# Patient Record
Sex: Female | Born: 1935 | Race: White | Hispanic: No | Smoking: Former smoker
Health system: Southern US, Community
[De-identification: ages and names within clinical notes are randomized; demographics above are authoritative.]

## PROBLEM LIST (undated history)

## (undated) DIAGNOSIS — M1990 Unspecified osteoarthritis, unspecified site: Secondary | ICD-10-CM

## (undated) DIAGNOSIS — I480 Paroxysmal atrial fibrillation: Secondary | ICD-10-CM

## (undated) DIAGNOSIS — I10 Essential (primary) hypertension: Secondary | ICD-10-CM

## (undated) DIAGNOSIS — J45909 Unspecified asthma, uncomplicated: Secondary | ICD-10-CM

## (undated) DIAGNOSIS — K219 Gastro-esophageal reflux disease without esophagitis: Secondary | ICD-10-CM

## (undated) DIAGNOSIS — J449 Chronic obstructive pulmonary disease, unspecified: Secondary | ICD-10-CM

## (undated) DIAGNOSIS — IMO0001 Reserved for inherently not codable concepts without codable children: Secondary | ICD-10-CM

## (undated) DIAGNOSIS — Z972 Presence of dental prosthetic device (complete) (partial): Secondary | ICD-10-CM

## (undated) DIAGNOSIS — I499 Cardiac arrhythmia, unspecified: Secondary | ICD-10-CM

## (undated) DIAGNOSIS — E7800 Pure hypercholesterolemia, unspecified: Secondary | ICD-10-CM

## (undated) DIAGNOSIS — K08109 Complete loss of teeth, unspecified cause, unspecified class: Secondary | ICD-10-CM

## (undated) DIAGNOSIS — M810 Age-related osteoporosis without current pathological fracture: Secondary | ICD-10-CM

## (undated) HISTORY — PX: ESOPHAGOGASTRODUODENOSCOPY (EGD) WITH ESOPHAGEAL DILATION: SHX5812

## (undated) HISTORY — PX: JOINT REPLACEMENT: SHX530

## (undated) HISTORY — PX: WRIST FRACTURE SURGERY: SHX121

## (undated) HISTORY — PX: BACK SURGERY: SHX140

---

## 2004-03-08 ENCOUNTER — Ambulatory Visit: Payer: Self-pay | Admitting: Unknown Physician Specialty

## 2004-03-15 ENCOUNTER — Ambulatory Visit: Payer: Self-pay | Admitting: Unknown Physician Specialty

## 2004-04-25 ENCOUNTER — Ambulatory Visit: Payer: Self-pay | Admitting: Surgery

## 2004-08-09 ENCOUNTER — Ambulatory Visit: Payer: Self-pay | Admitting: Internal Medicine

## 2004-10-16 ENCOUNTER — Inpatient Hospital Stay: Payer: Self-pay | Admitting: Internal Medicine

## 2005-02-14 ENCOUNTER — Other Ambulatory Visit: Payer: Self-pay

## 2005-02-14 ENCOUNTER — Inpatient Hospital Stay: Payer: Self-pay | Admitting: Internal Medicine

## 2005-04-02 ENCOUNTER — Inpatient Hospital Stay: Payer: Self-pay | Admitting: Unknown Physician Specialty

## 2005-04-02 ENCOUNTER — Other Ambulatory Visit: Payer: Self-pay

## 2005-04-11 ENCOUNTER — Encounter: Payer: Self-pay | Admitting: Internal Medicine

## 2006-07-03 ENCOUNTER — Ambulatory Visit: Payer: Self-pay | Admitting: Specialist

## 2006-07-18 ENCOUNTER — Emergency Department: Payer: Self-pay | Admitting: Emergency Medicine

## 2006-10-06 ENCOUNTER — Ambulatory Visit: Payer: Self-pay | Admitting: Ophthalmology

## 2006-10-06 ENCOUNTER — Other Ambulatory Visit: Payer: Self-pay

## 2006-10-13 ENCOUNTER — Ambulatory Visit: Payer: Self-pay | Admitting: Ophthalmology

## 2008-05-12 ENCOUNTER — Ambulatory Visit: Payer: Self-pay | Admitting: Internal Medicine

## 2008-05-23 ENCOUNTER — Ambulatory Visit: Payer: Self-pay | Admitting: Unknown Physician Specialty

## 2010-11-14 ENCOUNTER — Ambulatory Visit: Payer: Self-pay | Admitting: Internal Medicine

## 2011-06-13 ENCOUNTER — Ambulatory Visit: Payer: Self-pay

## 2011-06-18 ENCOUNTER — Ambulatory Visit: Payer: Self-pay | Admitting: Unknown Physician Specialty

## 2011-06-18 LAB — CBC
HCT: 39.1 % (ref 35.0–47.0)
MCH: 28.3 pg (ref 26.0–34.0)
MCHC: 32.7 g/dL (ref 32.0–36.0)
MCV: 86 fL (ref 80–100)
Platelet: 234 10*3/uL (ref 150–440)
RBC: 4.53 10*6/uL (ref 3.80–5.20)
WBC: 6.7 10*3/uL (ref 3.6–11.0)

## 2011-06-18 LAB — BASIC METABOLIC PANEL
Anion Gap: 11 (ref 7–16)
BUN: 11 mg/dL (ref 7–18)
Chloride: 105 mmol/L (ref 98–107)
Co2: 26 mmol/L (ref 21–32)
Creatinine: 0.81 mg/dL (ref 0.60–1.30)
EGFR (Non-African Amer.): >60
Osmolality: 282 (ref 275–301)
Potassium: 4.2 mmol/L (ref 3.5–5.1)
Sodium: 142 mmol/L (ref 136–145)

## 2011-06-18 LAB — URINALYSIS, COMPLETE
Bacteria: NONE SEEN
Glucose,UR: NEGATIVE mg/dL (ref 0–75)
Nitrite: NEGATIVE
Protein: NEGATIVE
RBC,UR: 16 /HPF (ref 0–5)
Squamous Epithelial: 7
Transitional Epi: <1
WBC UR: 5 /HPF (ref 0–5)

## 2011-06-20 LAB — PATHOLOGY REPORT

## 2014-06-07 ENCOUNTER — Observation Stay: Payer: Self-pay | Admitting: Internal Medicine

## 2014-06-08 ENCOUNTER — Encounter
Admit: 2014-06-08 | Disposition: A | Discharge status: Home / Self Care | Payer: Self-pay | Attending: Internal Medicine | Admitting: Internal Medicine

## 2014-06-30 LAB — URINALYSIS, COMPLETE
Bilirubin,UR: NEGATIVE
Glucose,UR: NEGATIVE mg/dL (ref 0–75)
KETONE: NEGATIVE
NITRITE: NEGATIVE
PROTEIN: NEGATIVE
Ph: 6 (ref 4.5–8.0)
RBC,UR: 1 /HPF (ref 0–5)
SPECIFIC GRAVITY: 1.01 (ref 1.003–1.030)
Squamous Epithelial: 3
WBC UR: 14 /HPF (ref 0–5)

## 2014-07-08 ENCOUNTER — Encounter
Admit: 2014-07-08 | Disposition: A | Discharge status: Home / Self Care | Payer: Self-pay | Attending: Internal Medicine | Admitting: Internal Medicine

## 2014-07-31 NOTE — Op Note (Signed)
PATIENT NAME:  Crystal Todd, Crystal Todd MR#:  [id number] DATE OF BIRTH:  [birth year]  DATE OF PROCEDURE:  06/18/2011  PREOPERATIVE DIAGNOSIS: Compression fracture of L1 with persistent pain.   POSTOPERATIVE DIAGNOSIS: Compression fracture of L1 with persistent pain.   PROCEDURES:  1. Kyphoplasty with biopsy of L1 and injection of PMMA cement.  2. Fluoroscopic interpretation of trocar placement for kyphoplasty and biopsy of L1.   SURGEON: Hamsini Verrilli C. Ilai Hiller, MD   ASSISTANT: None.   ANESTHESIA: MAC.   ESTIMATED BLOOD LOSS: Negligible.   COMPLICATIONS: None.   SPECIMEN SENT: Tissue from L1.   BRIEF CLINICAL NOTE AND PATHOLOGY: The patient had severe unrelenting back pain with documented compression fracture of L1. Old compression fractures at other levels. Options, risks, and benefits were discussed in detail. At the time of the procedure, there was very significant thoracic vertebral body. The vertebral body was expanded somewhat with the balloon.   A good bone specimen was obtained.   PROCEDURE: Preop antibiotics, adequate MAC anesthesia, prone position, all prominences well padded. Routine prepping and draping. Appropriate time-out was called.   The L1 vertebral body was identified and pedicles were aligned appropriately with the FluoroScan. Prepping and draping was performed and the vertebral body was instrumented in a routine fashion through the pedicle on the right side using fluoroscopic guidance. The trocar advanced nicely into the vertebral body and a good specimen was obtained. The balloon was then inserted, the guidewire was removed and the vertebral body was injected with 1 mL of 1% plain Xylocaine. The balloon was then inflated while using fluoroscopic guidance. The balloon was then removed and cement was injected when it was of the appropriate consistency. There was no evidence of extravasation. The cannula was cleaned and removed. Incision was closed with Monocryl. Soft sterile dressing  was applied. The patient was awakened and taken to the PAC-U having tolerated the procedure well.   ____________________________ Winn Jock. Gerrit Heck, MD jcc:drc D: 06/18/2011 14:09:41 ET T: 06/18/2011 15:03:30 ET JOB#: [id number]  cc: Winn Jock. Gerrit Heck, MD, <Dictator> Winn Jock Floretta Petro MD ELECTRONICALLY SIGNED 06/20/2011 12:23

## 2014-08-07 NOTE — Consult Note (Signed)
Brief Consult Note: Diagnosis: non displaced left distal radius fracture.   Patient was seen by consultant.   Comments: will cast later today.  Electronic Signatures: Leitha Schuller (MD)  (Signed 02-Mar-16 08:37)  Authored: Brief Consult Note   Last Updated: 02-Mar-16 08:37 by Leitha Schuller (MD)

## 2014-08-07 NOTE — H&P (Signed)
PATIENT NAME:  Crystal Todd, Crystal Todd MR#:  [id number] DATE OF BIRTH:  03/02/1928  DATE OF ADMISSION:  06/07/2014  PRIMARY CARE PROVIDER: Dr. Hyacinth Meeker   EMERGENCY DEPARTMENT REFERRING PHYSICIAN: Dr. Cyril Loosen   CHIEF COMPLAINT: Status post fall with multiple facial fractures and wrist fracture.   HISTORY OF PRESENT ILLNESS: The patient is a pleasant 81 year old white female with a history of having atrial fibrillation, essential hypertension, rheumatoid arthritis, reflux, depression, macular degeneration, stenosis of the right carotid artery who was walking in her driveway when she fell. She reports that she lost balance. She did not lose consciousness. She reports that she was fully aware when she was falling. She denies any chest pain, shortness of breath or palpitations. The patient came to the ER with these symptoms and had an evaluation which showed multiple facial bone fractures involving the anterior and lateral wall of the right maxillary sinus, inferior wall, right orbit and the medial wall of both maxillary sinuses. The ED physician spoke to the ENT doctor on call who recommended that she receive antibiotics for prevention of infection. The patient had a risk factor for which she had a splint placed. She otherwise denies any fevers, chills. No chest pain, palpitations.   PAST MEDICAL HISTORY: 1.  History of atrial fibrillation.  2.  History of essential hypertension.  3.  Rheumatoid arthritis.  4.  History of calculus of the gallbladder without mention of cholecystitis or obstruction.  5.  Unspecified hyperlipidemia.  6.  GERD.   7.  Senile osteoporosis.  8.  Chronic obstructive asthma.  9.  Anemia.  10.   Macular degeneration.  11.   Stenosis of the right carotid artery.  12.   Glaucoma.  13.   Cataract, senile.  14.   History of B12 deficiency.   ALLERGIES: ACE INHIBITORS, FLU VACCINE, FLOMAX, AND EGGS.   CURRENT MEDICATIONS: At home she is on vitamin D3 at 1000 international units  daily, vitamin B12 at 1000 mcg daily, tramadol 50 q. 6 p.r.n., Paxil 20 daily, omeprazole 20 daily, multivitamin 1 tab p.o. daily, metoprolol succinate ER 25 p.o. daily, Combivent inhalation every 6 hours as needed, Celexa 20 daily, aspirin 81 mg 1 tab p.o. daily, amlodipine 5 daily, Allegra 180 daily.   SOCIAL HISTORY: Used to smoke. No alcohol or drug use. Lives by herself.   FAMILY HISTORY: Positive for hypertension.   REVIEW OF SYSTEMS: CONSTITUTIONAL: Denies any fevers, chills. No weight loss or weight gain.  EYES: No blurred or double vision. History of glaucoma, senile cataract.  ENT: No tinnitus, no ear pain, hearing loss. No seasonal or year-round allergies. No difficulty swallowing.  RESPIRATORY: Denies any cough, wheezing, hemoptysis.  CARDIOVASCULAR: Denies any chest pain, orthopnea, edema, or arrhythmia.  GASTROINTESTINAL: No nausea, vomiting, diarrhea. No abdominal pain.  GENITOURINARY: Denies any dysuria, hematuria, renal colic or frequency.  ENDOCRINE: Denies any polyuria or nocturia.  HEMATOLOGIC AND LYMPHATIC: Denies anemia, easy bruisability or bleeding.  SKIN: Has bruising around her face.  MUSCULOSKELETAL: Has osteoarthritis. NEUROLOGIC: No CVA, TIA or seizures.   PHYSICAL EXAMINATION: VITAL SIGNS: Temperature 97.8, pulse 85, respirations 18, blood pressure 135/90, O2 95%.  GENERAL: The patient is a thin Caucasian female in no acute distress.  HEENT: Head atraumatic, normocephalic. Pupils equally round, reactive to light and accommodation. There is no conjunctival pallor. No sclerae icterus. Right eye with swelling around that, so unable to open the eye as a result of bruising. Left eye is reactive. Nasal area has blood. Oropharynx has  blood from the mouth.  NECK: Supple without any thyromegaly.  CARDIOVASCULAR: Regular rate and rhythm. No murmurs, rubs, clicks, or gallops.  LUNGS: Clear to auscultation bilaterally without any rales, rhonchi, wheezing.  ABDOMEN: Soft,  nontender, nondistended. Positive bowel sounds x 4.  EXTREMITIES: No clubbing, cyanosis, or edema.  SKIN: Has bruising around her face and her arms.  PSYCHIATRIC: Not anxious or depressed.  NEUROLOGIC: Awake, alert, oriented to place, person, time. No focal deficits.   EVALUATIONS: Glucose 124, BUN 8, creatinine 0.76, sodium 141, potassium 3.4, chloride 106, CO2 is 24, calcium 8.3. LFTs were normal, except albumin of 3.3. WBC 14.0, hemoglobin 12.7, platelet count 184,000.   ASSESSMENT AND PLAN: The patient is a 81 year old white female status post fall due to balance issues, has facial fracture and wrist fracture. The ED MD spoke to ENT and ophthalmology, recommended antibiotics and conservative management.  1.  Fall with multiple fractures. Supportive care, empiric antibiotics per ENT, risk factor, orthopedic consultation, splint.  2.  Hypertension. We will continue metoprolol and amlodipine.  3.  Depression. Continue citalopram.  4.  Miscellaneous. The patient will be on SCDs for deep vein thrombosis prophylaxis.   TIME SPENT ON THIS: 50 minutes.    ____________________________ Lacie Scotts Allena Katz, MD shp:at D: 06/07/2014 16:40:16 ET T: 06/07/2014 16:57:47 ET JOB#: [id number]  cc: Naftuli Dalsanto H. Allena Katz, MD, <Dictator> Charise Carwin MD ELECTRONICALLY SIGNED 06/10/2014 15:23

## 2014-08-07 NOTE — Consult Note (Signed)
PATIENT NAME:  Crystal Todd, Crystal Todd MR#:  [id number] DATE OF BIRTH:  14-Aug-1927  DATE OF CONSULTATION:  06/08/2014  CONSULTING PHYSICIAN:  Leitha Schuller, MD  EASON FOR CONSULTATION: Left wrist fracture.   HISTORY OF PRESENT ILLNESS: The patient is an 81 year old who fell while getting her mail yesterday. She suffered facial fractures and also suffered a large hematoma to the dorsum of the right hand with no apparent fractures. Left distal radius has an oblique nondisplaced fracture on x-ray and has consulted for evaluation.   PHYSICAL EXAMINATION: She is neurovascularly intact with minimal swelling. She is tender to palpation. There is some underlying osteoarthritis present on radiographs. She is able to flex and extend her fingers. All tendon and nerve function appears intact.   CLINICAL IMPRESSION: Nondisplaced distal radius fracture.   PLAN: Four weeks in a short-arm cast. Short-arm cast was applied today.  In 4 weeks will have her back, remove cast, x-ray and determine if it is healed enough to go to a removable brace or needs continue hard cast. Regarding her hand hematoma, I assured her this will slowly resolve without any further treatment.    ____________________________ Leitha Schuller, MD mjm:mc D: 06/08/2014 12:18:27 ET T: 06/08/2014 13:07:25 ET JOB#: [id number]  cc: Leitha Schuller, MD, <Dictator> Leitha Schuller MD ELECTRONICALLY SIGNED 06/08/2014 14:23

## 2014-08-07 NOTE — Discharge Summary (Signed)
PATIENT NAME:  Crystal Todd, Crystal Todd MR#:  [id number] DATE OF BIRTH:  November 18, 1927  DATE OF ADMISSION:  06/07/2014 DATE OF DISCHARGE:  06/09/2014  DISCHARGE DIAGNOSES: 1.  Right orbital fracture with maxillary fractures.  2.  Distal left wrist fracture.  3.  Rheumatoid arthritis.  4.  Hypertension.  5.  Chronic asthma. 6.  B12 deficiency.   DISCHARGE MEDICATIONS: Vitamin D3 1,000 units daily, amlodipine 5 mg daily, citalopram 20 mg daily, vitamin B12 1000 mcg daily, omeprazole 20 mg daily, metoprolol tartrate 12.5 mg b.i.d., Tylenol 650 mg q. 4 p.r.n. pain, Augmentin 875 mg b.i.d. x7 days.   REASON FOR ADMISSION: An 81 year old female who presents post fall with multiple fractures. Please see H and P for HPI, past medical history and physical exam.   HOSPITAL COURSE: The patient was admitted with fractures of her right maxilla, orbit and distal radius. Ophthalmology said there is no further treatment, and she does have good vision out of that right eye. ENT cleared her as well. Her swallowing improved significantly. Her blood counts and blood pressure were stable. Her appetite is good, but she is very weak and will be needing skilled nursing. Dr. Rosita Kea casted her left arm and she will need follow-up in [facility] orthopedics in 2 weeks. Overall prognosis is good.   ____________________________ Danella Penton, MD mfm:sb D: 06/09/2014 07:46:16 ET T: 06/09/2014 08:13:10 ET JOB#: [id number]  cc: Danella Penton, MD, <Dictator> Danella Penton MD ELECTRONICALLY SIGNED 06/10/2014 8:14

## 2014-10-07 ENCOUNTER — Encounter: Payer: Self-pay | Admitting: *Deleted

## 2014-10-11 NOTE — Discharge Instructions (Signed)

## 2014-10-12 ENCOUNTER — Ambulatory Visit: Payer: Medicare Other | Admitting: Anesthesiology

## 2014-10-12 ENCOUNTER — Ambulatory Visit
Admission: RE | Admit: 2014-10-12 | Discharge: 2014-10-12 | Disposition: A | Discharge status: Home / Self Care | Payer: Medicare Other | Source: Ambulatory Visit | Attending: Ophthalmology | Admitting: Ophthalmology

## 2014-10-12 ENCOUNTER — Encounter
Admission: RE | Disposition: A | Discharge status: Home / Self Care | Payer: Self-pay | Source: Ambulatory Visit | Attending: Ophthalmology

## 2014-10-12 DIAGNOSIS — Z887 Allergy status to serum and vaccine status: Secondary | ICD-10-CM | POA: Insufficient documentation

## 2014-10-12 DIAGNOSIS — H2512 Age-related nuclear cataract, left eye: Secondary | ICD-10-CM | POA: Diagnosis not present

## 2014-10-12 DIAGNOSIS — M810 Age-related osteoporosis without current pathological fracture: Secondary | ICD-10-CM | POA: Diagnosis not present

## 2014-10-12 DIAGNOSIS — Z9849 Cataract extraction status, unspecified eye: Secondary | ICD-10-CM | POA: Insufficient documentation

## 2014-10-12 DIAGNOSIS — Z79899 Other long term (current) drug therapy: Secondary | ICD-10-CM | POA: Diagnosis not present

## 2014-10-12 DIAGNOSIS — Z96649 Presence of unspecified artificial hip joint: Secondary | ICD-10-CM | POA: Diagnosis not present

## 2014-10-12 DIAGNOSIS — J45909 Unspecified asthma, uncomplicated: Secondary | ICD-10-CM | POA: Diagnosis not present

## 2014-10-12 DIAGNOSIS — E780 Pure hypercholesterolemia: Secondary | ICD-10-CM | POA: Diagnosis not present

## 2014-10-12 DIAGNOSIS — M069 Rheumatoid arthritis, unspecified: Secondary | ICD-10-CM | POA: Insufficient documentation

## 2014-10-12 DIAGNOSIS — Z7951 Long term (current) use of inhaled steroids: Secondary | ICD-10-CM | POA: Diagnosis not present

## 2014-10-12 DIAGNOSIS — I4891 Unspecified atrial fibrillation: Secondary | ICD-10-CM | POA: Insufficient documentation

## 2014-10-12 DIAGNOSIS — I10 Essential (primary) hypertension: Secondary | ICD-10-CM | POA: Diagnosis not present

## 2014-10-12 DIAGNOSIS — J449 Chronic obstructive pulmonary disease, unspecified: Secondary | ICD-10-CM | POA: Diagnosis not present

## 2014-10-12 DIAGNOSIS — Z87891 Personal history of nicotine dependence: Secondary | ICD-10-CM | POA: Insufficient documentation

## 2014-10-12 DIAGNOSIS — H269 Unspecified cataract: Secondary | ICD-10-CM | POA: Diagnosis present

## 2014-10-12 HISTORY — DX: Unspecified asthma, uncomplicated: J45.909

## 2014-10-12 HISTORY — DX: Chronic obstructive pulmonary disease, unspecified: J44.9

## 2014-10-12 HISTORY — DX: Cardiac arrhythmia, unspecified: I49.9

## 2014-10-12 HISTORY — DX: Gastro-esophageal reflux disease without esophagitis: K21.9

## 2014-10-12 HISTORY — DX: Unspecified osteoarthritis, unspecified site: M19.90

## 2014-10-12 HISTORY — PX: CATARACT EXTRACTION W/PHACO: SHX586

## 2014-10-12 HISTORY — DX: Reserved for inherently not codable concepts without codable children: IMO0001

## 2014-10-12 HISTORY — DX: Age-related osteoporosis without current pathological fracture: M81.0

## 2014-10-12 HISTORY — DX: Complete loss of teeth, unspecified cause, unspecified class: K08.109

## 2014-10-12 HISTORY — DX: Pure hypercholesterolemia, unspecified: E78.00

## 2014-10-12 HISTORY — DX: Essential (primary) hypertension: I10

## 2014-10-12 HISTORY — DX: Complete loss of teeth, unspecified cause, unspecified class: Z97.2

## 2014-10-12 SURGERY — PHACOEMULSIFICATION, CATARACT, WITH IOL INSERTION
Anesthesia: Topical | Laterality: Left | Wound class: Clean

## 2014-10-12 MED ORDER — NA HYALUR & NA CHOND-NA HYALUR 0.4-0.35 ML IO KIT
PACK | INTRAOCULAR | Status: DC | PRN
Start: 2014-10-12 — End: 2014-10-12
  Administered 2014-10-12: 1 mL via INTRAOCULAR

## 2014-10-12 MED ORDER — CEFUROXIME OPHTHALMIC INJECTION 1 MG/0.1 ML
INJECTION | OPHTHALMIC | Status: DC | PRN
  Administered 2014-10-12: 0.1 mL via INTRACAMERAL

## 2014-10-12 MED ORDER — FENTANYL CITRATE (PF) 100 MCG/2ML IJ SOLN
INTRAMUSCULAR | Status: DC | PRN
  Administered 2014-10-12: 50 ug via INTRAVENOUS

## 2014-10-12 MED ORDER — EPINEPHRINE HCL 1 MG/ML IJ SOLN
INTRAOCULAR | Status: DC | PRN
  Administered 2014-10-12: 65 mL via OPHTHALMIC

## 2014-10-12 MED ORDER — TETRACAINE HCL 0.5 % OP SOLN
1.0000 [drp] | OPHTHALMIC | Status: DC | PRN
  Administered 2014-10-12: 1 [drp] via OPHTHALMIC

## 2014-10-12 MED ORDER — POVIDONE-IODINE 5 % OP SOLN
1.0000 "application " | OPHTHALMIC | Status: DC | PRN
  Administered 2014-10-12: 1 via OPHTHALMIC

## 2014-10-12 MED ORDER — ACETAMINOPHEN 160 MG/5ML PO SOLN: 325.0000 mg | ORAL | Status: DC | PRN

## 2014-10-12 MED ORDER — ACETAMINOPHEN 325 MG PO TABS: 325.0000 mg | ORAL_TABLET | ORAL | Status: DC | PRN

## 2014-10-12 MED ORDER — TIMOLOL MALEATE 0.5 % OP SOLN
OPHTHALMIC | Status: DC | PRN
Start: 2014-10-12 — End: 2014-10-12
  Administered 2014-10-12: 1 [drp] via OPHTHALMIC

## 2014-10-12 MED ORDER — BRIMONIDINE TARTRATE 0.2 % OP SOLN
OPHTHALMIC | Status: DC | PRN
  Administered 2014-10-12: 1 [drp] via OPHTHALMIC

## 2014-10-12 MED ORDER — MIDAZOLAM HCL 2 MG/2ML IJ SOLN
INTRAMUSCULAR | Status: DC | PRN
  Administered 2014-10-12: 2 mg via INTRAVENOUS

## 2014-10-12 MED ORDER — ARMC OPHTHALMIC DILATING GEL
1.0000 "application " | OPHTHALMIC | Status: DC | PRN
  Administered 2014-10-12 (×2): 1 via OPHTHALMIC

## 2014-10-12 SURGICAL SUPPLY — 26 items
CANNULA ANT/CHMB 27GA (MISCELLANEOUS) ×3 IMPLANT
GLOVE SURG LX 7.5 STRW (GLOVE) ×2
GLOVE SURG LX STRL 7.5 STRW (GLOVE) ×1 IMPLANT
GLOVE SURG TRIUMPH 8.0 PF LTX (GLOVE) ×3 IMPLANT
GOWN STRL REUS W/ TWL LRG LVL3 (GOWN DISPOSABLE) ×2 IMPLANT
GOWN STRL REUS W/TWL LRG LVL3 (GOWN DISPOSABLE) ×4
LENS IOL ACRSF IQ PC 19.0 (Intraocular Lens) ×1 IMPLANT
LENS IOL ACRYSOF IQ POST 19.0 (Intraocular Lens) ×3 IMPLANT
MARKER SKIN SURG W/RULER VIO (MISCELLANEOUS) ×3 IMPLANT
NDL RETROBULBAR .5 NSTRL (NEEDLE) IMPLANT
NEEDLE FILTER BLUNT 18X 1/2SAF (NEEDLE) ×2
NEEDLE FILTER BLUNT 18X1 1/2 (NEEDLE) ×1 IMPLANT
PACK CATARACT BRASINGTON (MISCELLANEOUS) ×3 IMPLANT
PACK EYE AFTER SURG (MISCELLANEOUS) ×3 IMPLANT
PACK OPTHALMIC (MISCELLANEOUS) ×3 IMPLANT
RING MALYGIN 7.0 (MISCELLANEOUS) IMPLANT
SUT ETHILON 10-0 CS-B-6CS-B-6 (SUTURE)
SUT VICRYL  9 0 (SUTURE)
SUT VICRYL 9 0 (SUTURE) IMPLANT
SUTURE EHLN 10-0 CS-B-6CS-B-6 (SUTURE) IMPLANT
SYR 3ML LL SCALE MARK (SYRINGE) ×3 IMPLANT
SYR 5ML LL (SYRINGE) IMPLANT
SYR TB 1ML LUER SLIP (SYRINGE) ×3 IMPLANT
WATER STERILE IRR 250ML POUR (IV SOLUTION) ×3 IMPLANT
WATER STERILE IRR 500ML POUR (IV SOLUTION) IMPLANT
WIPE NON LINTING 3.25X3.25 (MISCELLANEOUS) ×3 IMPLANT

## 2014-10-12 NOTE — Anesthesia Preprocedure Evaluation (Signed)
Anesthesia Evaluation  Patient identified by MRN, date of birth, ID band  Reviewed: Allergy & Precautions, H&P , NPO status , Patient's Chart, lab work & pertinent test results  Airway Mallampati: II  TM Distance: >3 FB Neck ROM: full    Dental no notable dental hx.    Pulmonary COPDformer smoker,    Pulmonary exam normal       Cardiovascular hypertension, Rhythm:regular Rate:Normal     Neuro/Psych    GI/Hepatic GERD-  ,  Endo/Other    Renal/GU      Musculoskeletal   Abdominal   Peds  Hematology   Anesthesia Other Findings   Reproductive/Obstetrics                             Anesthesia Physical Anesthesia Plan  ASA: III  Anesthesia Plan:    Post-op Pain Management:    Induction:   Airway Management Planned:   Additional Equipment:   Intra-op Plan:   Post-operative Plan:   Informed Consent: I have reviewed the patients History and Physical, chart, labs and discussed the procedure including the risks, benefits and alternatives for the proposed anesthesia with the patient or authorized representative who has indicated his/her understanding and acceptance.     Plan Discussed with: CRNA  Anesthesia Plan Comments:         Anesthesia Quick Evaluation

## 2014-10-12 NOTE — Op Note (Signed)
OPERATIVE NOTE  ZYONA PETTAWAY [id number] 10/12/2014   PREOPERATIVE DIAGNOSIS:  Nuclear sclerotic cataract left eye. H25.12   POSTOPERATIVE DIAGNOSIS:    Nuclear sclerotic cataract left eye.     PROCEDURE:  Phacoemusification with posterior chamber intraocular lens placement of the left eye   LENS:   Implant Name Type Inv. Item Serial No. Manufacturer Lot No. LRB No. Used  IMPLANT LENS - [id number] Intraocular Lens IMPLANT LENS [id number] ALCON   Left 1     [id number] 20.0 D   ULTRASOUND TIME: 21  % of 1 minutes 11 seconds, CDE 15.2  SURGEON:  Deirdre Evener, MD   ANESTHESIA:  Topical with tetracaine drops and 2% Xylocaine jelly.   COMPLICATIONS:  None.   DESCRIPTION OF PROCEDURE:  The patient was identified in the holding room and transported to the operating room and placed in the supine position under the operating microscope.  The left eye was identified as the operative eye and it was prepped and draped in the usual sterile ophthalmic fashion.   A 1 millimeter clear-corneal paracentesis was made at the 1:30 position.  The anterior chamber was filled with Viscoat viscoelastic.  A 2.4 millimeter keratome was used to make a near-clear corneal incision at the 10:30 position.  .  A curvilinear capsulorrhexis was made with a cystotome and capsulorrhexis forceps.  Balanced salt solution was used to hydrodissect and hydrodelineate the nucleus.   Phacoemulsification was then used in stop and chop fashion to remove the lens nucleus and epinucleus.  The remaining cortex was then removed using the irrigation and aspiration handpiece. Provisc was then placed into the capsular bag to distend it for lens placement.  A lens was then injected into the capsular bag.  The remaining viscoelastic was aspirated.   Wounds were hydrated with balanced salt solution.  The anterior chamber was inflated to a physiologic pressure with balanced salt solution.  No wound leaks were noted. Cefuroxime 0.1  ml of a 10mg /ml solution was injected into the anterior chamber for a dose of 1 mg of intracameral antibiotic at the completion of the case.   Timolol and Brimonidine drops were applied to the eye.  The patient was taken to the recovery room in stable condition without complications of anesthesia or surgery.  Judie Hollick 10/12/2014, 10:13 AM

## 2014-10-12 NOTE — H&P (Signed)
  The History and Physical notes were scanned in.  The patient remains stable and unchanged from the H&P.   Previous H&P reviewed, patient examined, and there are no changes.  Crystal Todd 10/12/2014 9:19 AM

## 2014-10-12 NOTE — Anesthesia Procedure Notes (Signed)
Procedure Name: MAC Performed by: Salar Molden Pre-anesthesia Checklist: Patient identified, Emergency Drugs available, Suction available, Timeout performed and Patient being monitored Patient Re-evaluated:Patient Re-evaluated prior to inductionOxygen Delivery Method: Nasal cannula Placement Confirmation: positive ETCO2     

## 2014-10-12 NOTE — Anesthesia Postprocedure Evaluation (Signed)
  Anesthesia Post-op Note  Patient: Crystal Todd  Procedure(s) Performed: Procedure(s): CATARACT EXTRACTION PHACO AND INTRAOCULAR LENS PLACEMENT (IOC) (Left)  Anesthesia type:No value filed.  Patient location: PACU  Post pain: Pain level controlled  Post assessment: Post-op Vital signs reviewed, Patient's Cardiovascular Status Stable, Respiratory Function Stable, Patent Airway and No signs of Nausea or vomiting  Post vital signs: Reviewed and stable  Last Vitals:  Filed Vitals:   10/12/14 1020  BP: 110/64  Pulse: 61  Temp:   Resp: 13    Level of consciousness: awake, alert  and patient cooperative  Complications: No apparent anesthesia complications

## 2014-10-12 NOTE — Transfer of Care (Signed)
Immediate Anesthesia Transfer of Care Note  Patient: Crystal Todd  Procedure(s) Performed: Procedure(s): CATARACT EXTRACTION PHACO AND INTRAOCULAR LENS PLACEMENT (IOC) (Left)  Patient Location: PACU  Anesthesia Type: No value filed.  Level of Consciousness: awake, alert  and patient cooperative  Airway and Oxygen Therapy: Patient Spontanous Breathing and Patient connected to supplemental oxygen  Post-op Assessment: Post-op Vital signs reviewed, Patient's Cardiovascular Status Stable, Respiratory Function Stable, Patent Airway and No signs of Nausea or vomiting  Post-op Vital Signs: Reviewed and stable  Complications: No apparent anesthesia complications

## 2014-10-13 ENCOUNTER — Encounter: Payer: Self-pay | Admitting: Ophthalmology

## 2015-10-29 ENCOUNTER — Encounter: Payer: Self-pay | Admitting: Emergency Medicine

## 2015-10-29 ENCOUNTER — Emergency Department
Admission: EM | Admit: 2015-10-29 | Discharge: 2015-10-29 | Disposition: A | Discharge status: Home / Self Care | Payer: Medicare Other | Attending: Emergency Medicine | Admitting: Emergency Medicine

## 2015-10-29 DIAGNOSIS — T43215A Adverse effect of selective serotonin and norepinephrine reuptake inhibitors, initial encounter: Secondary | ICD-10-CM | POA: Insufficient documentation

## 2015-10-29 DIAGNOSIS — R42 Dizziness and giddiness: Secondary | ICD-10-CM | POA: Insufficient documentation

## 2015-10-29 DIAGNOSIS — J45909 Unspecified asthma, uncomplicated: Secondary | ICD-10-CM | POA: Insufficient documentation

## 2015-10-29 DIAGNOSIS — Z87891 Personal history of nicotine dependence: Secondary | ICD-10-CM | POA: Insufficient documentation

## 2015-10-29 DIAGNOSIS — I10 Essential (primary) hypertension: Secondary | ICD-10-CM | POA: Diagnosis not present

## 2015-10-29 DIAGNOSIS — M179 Osteoarthritis of knee, unspecified: Secondary | ICD-10-CM | POA: Insufficient documentation

## 2015-10-29 DIAGNOSIS — I4891 Unspecified atrial fibrillation: Secondary | ICD-10-CM | POA: Diagnosis not present

## 2015-10-29 DIAGNOSIS — R682 Dry mouth, unspecified: Secondary | ICD-10-CM | POA: Diagnosis not present

## 2015-10-29 DIAGNOSIS — M810 Age-related osteoporosis without current pathological fracture: Secondary | ICD-10-CM | POA: Diagnosis not present

## 2015-10-29 DIAGNOSIS — T402X5A Adverse effect of other opioids, initial encounter: Secondary | ICD-10-CM | POA: Insufficient documentation

## 2015-10-29 DIAGNOSIS — Z79899 Other long term (current) drug therapy: Secondary | ICD-10-CM | POA: Insufficient documentation

## 2015-10-29 DIAGNOSIS — J449 Chronic obstructive pulmonary disease, unspecified: Secondary | ICD-10-CM | POA: Diagnosis not present

## 2015-10-29 DIAGNOSIS — Z7951 Long term (current) use of inhaled steroids: Secondary | ICD-10-CM | POA: Insufficient documentation

## 2015-10-29 DIAGNOSIS — T50901A Poisoning by unspecified drugs, medicaments and biological substances, accidental (unintentional), initial encounter: Secondary | ICD-10-CM

## 2015-10-29 NOTE — ED Provider Notes (Addendum)
[facility] Emergency Department Provider Note  ____________________________________________   I have reviewed the triage vital signs and the nursing notes.   HISTORY  Chief Complaint Dizziness    HPI Crystal Todd is a 82 y.o. female who presents today planning of "I feel much better". Patient was apparently accidentally given tramadol and hydrocodone last night. She states she had a very good night sleep and this morning she stated she was a little bit lightheaded when she woke and had some dry mouth. We have given her something to drink here, she is no longer lightheaded she has no dramatic she has no complaints and she wants to go home.     Past Medical History:  Diagnosis Date  . Arthritis    hands, knees  . Asthma   . COPD (chronic obstructive pulmonary disease) (HCC)   . Dysrhythmia    A-Fib  . Full dentures    upper and lower  . GERD (gastroesophageal reflux disease)   . Hypercholesteremia   . Hypertension   . Osteoporosis   . Shortness of breath dyspnea     There are no active problems to display for this patient.   Past Surgical History:  Procedure Laterality Date  . BACK SURGERY     "cemented" discs  . CATARACT EXTRACTION W/PHACO Left 10/12/2014   Procedure: CATARACT EXTRACTION PHACO AND INTRAOCULAR LENS PLACEMENT (IOC);  Surgeon: Lockie Mola, MD;  Location: [facility];  Service: Ophthalmology;  Laterality: Left;  . ESOPHAGOGASTRODUODENOSCOPY (EGD) WITH ESOPHAGEAL DILATION    . JOINT REPLACEMENT     hip  . WRIST FRACTURE SURGERY Bilateral     Current Outpatient Rx  . Order #: [id number] Class: Historical Med  . Order #: [id number] Class: Historical Med  . Order #: [id number] Class: Historical Med  . Order #: [id number] Class: Historical Med  . Order #: [id number] Class: Historical Med  . Order #: [id number] Class: Historical Med  . Order #: [id number] Class: Historical Med  . Order #: [id number] Class: Historical Med   . Order #: [id number] Class: Historical Med    Allergies Ace inhibitors; Alendronate; and Influenza vaccines  No family history on file.  Social History Social History  Substance Use Topics  . Smoking status: Former Smoker    Quit date: 04/08/1974  . Smokeless tobacco: Never Used  . Alcohol use No    Review of Systems Constitutional: No fever/chills Eyes: No visual changes. ENT: No sore throat. No stiff neck no neck pain Cardiovascular: Denies chest pain. Respiratory: Denies shortness of breath. Gastrointestinal:   no vomiting.  No diarrhea.  No constipation. Genitourinary: Negative for dysuria. Musculoskeletal: Negative lower extremity swelling Skin: Negative for rash. Neurological: Negative for severe headaches, focal weakness or numbness. 10-point ROS otherwise negative.  ____________________________________________   PHYSICAL EXAM:  VITAL SIGNS: ED Triage Vitals [10/29/15 0721]  Enc Vitals Group     BP (!) 152/69     Pulse Rate 76     Resp 16     Temp 97.4 F (36.3 C)     Temp Source Oral     SpO2 96 %     Weight 104 lb 12.8 oz (47.5 kg)     Height 5\' 4"  (1.626 m)     Head Circumference      Peak Flow      Pain Score      Pain Loc      Pain Edu?      Excl. in GC?     Constitutional: Alert and oriented. Well  appearing and in no acute distress. Eyes: Conjunctivae are normal. PERRL. EOMI. Head: Atraumatic. Nose: No congestion/rhinnorhea. Mouth/Throat: Mucous membranes are moist.  Oropharynx non-erythematous. Neck: No stridor.   Nontender with no meningismus Cardiovascular: Normal rate, regular rhythm. Grossly normal heart sounds.  Good peripheral circulation. Respiratory: Normal respiratory effort.  No retractions. Lungs CTAB. Abdominal: Soft and nontender. No distention. No guarding no rebound Back:  There is no focal tenderness or step off.  there is no midline tenderness there are no lesions noted. there is no CVA tenderness Musculoskeletal: No lower  extremity tenderness, no upper extremity tenderness. No joint effusions, no DVT signs strong distal pulses no edema Neurologic:  Normal speech and language. No gross focal neurologic deficits are appreciated.  Skin:  Skin is warm, dry and intact. No rash noted. Psychiatric: Mood and affect are normal. Speech and behavior are normal.  ____________________________________________   LABS (all labs ordered are listed, but only abnormal results are displayed)  Labs Reviewed - No data to display ____________________________________________  EKG  I personally interpreted any EKGs ordered by me or triage Normal sinus rhythm rate 79 bpm, possible old anterior infarct no acute ST elevation or depression low voltage noted ____________________________________________  RADIOLOGY  I reviewed any imaging ordered by me or triage that were performed during my shift and, if possible, patient and/or family made aware of any abnormal findings. ____________________________________________   PROCEDURES  Procedure(s) performed: None  Procedures  Critical Care performed: None  ____________________________________________   INITIAL IMPRESSION / ASSESSMENT AND PLAN / ED COURSE  Pertinent labs & imaging results that were available during my care of the patient were reviewed by me and considered in my medical decision making (see chart for details).  Patient with no complaints or distress requesting discharge at this time. Approximately 12 hours ago she was given a single dose of trazodone and hydrocodone and has no complaints at this time. Other medications are not likely to cause her any particular injury. No evidence allergic reactions oversedation or other toxidrome noted  Clinical Course   ____________________________________________   FINAL CLINICAL IMPRESSION(S) / ED DIAGNOSES  Final diagnoses:  None      This chart was dictated using voice recognition software.  Despite best  efforts to proofread,  errors can occur which can change meaning.      Jeanmarie Plant, MD 10/29/15 2725    Jeanmarie Plant, MD 10/29/15 [phone]

## 2015-10-29 NOTE — ED Triage Notes (Signed)
Patient brought in by [facility] from [facility]. Patient was given the wrong medication last night around 11pm. Patient was given trazodeon, duloxetine, melatonin, erythromycin, and hydrocodone. Patient is now c/o dizziness, nausea, and dry mouth.

## 2015-10-29 NOTE — Discharge Instructions (Signed)
We do not anticipate any more side effects from those medications you  had last night. Please talk to your medical providers about making sure that you get the right medications. Return to the emergency room for any new or worrisome symptoms.

## 2015-10-29 NOTE — ED Notes (Signed)
Spoke with Dot Lanes at [employer] and informed her that patient is being discharged and needs to follow up with her PCP.

## 2016-08-24 IMAGING — CT CT CERVICAL SPINE WITHOUT CONTRAST
3 of 11 series · 9 of 33 positions shown, 10 images · non-contrast
Comparison: CT head and facial bones 07/18/2006

CLINICAL DATA: Tripped and fell on the way to the mailbox striking
RIGHT side, RIGHT side of face swollen and bruised, nosebleed, on
aspirin

EXAM:
CT HEAD WITHOUT CONTRAST
CT MAXILLOFACIAL WITHOUT CONTRAST
CT CERVICAL SPINE WITHOUT CONTRAST
TECHNIQUE: Multidetector CT imaging of the head, cervical spine, and
maxillofacial structures were performed using the standard protocol
without intravenous contrast. Multiplanar CT image reconstructions
of the cervical spine and maxillofacial structures were also
generated.

[Series 3: bone · axial · 0.39mm/px · z∈[-188,-16]mm · 3 of 87 slices shown, 4 images]
[im 1/87  soft-tissue]
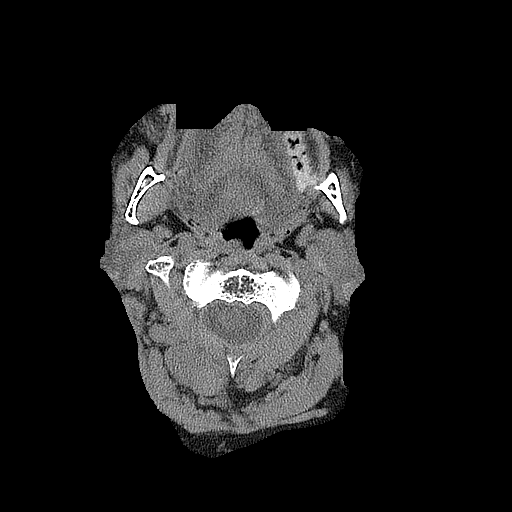
[im 1/87  bone]
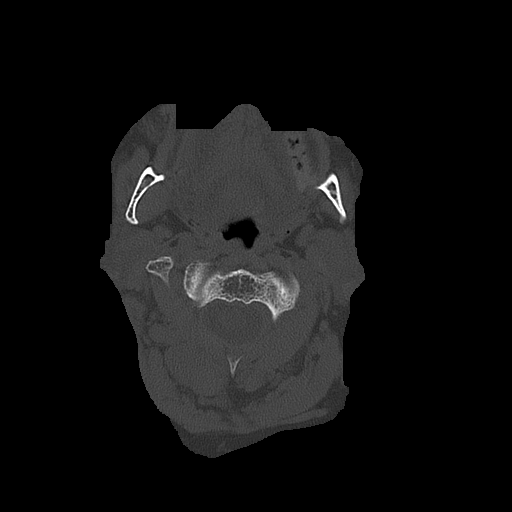
[im 44/87  bone]
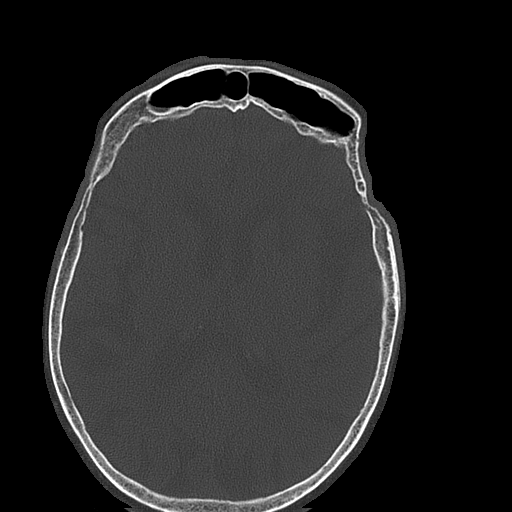
[im 87/87  bone]
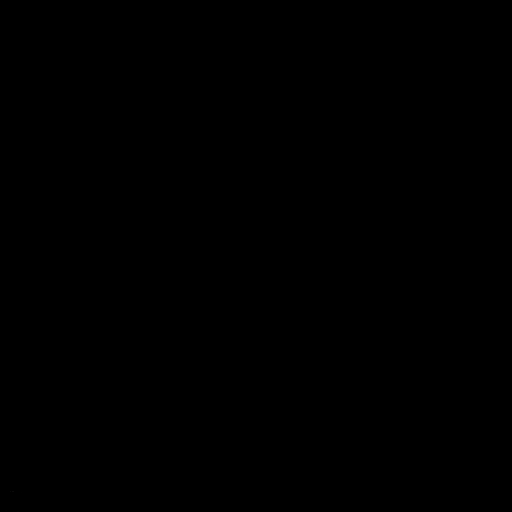

[Series 16: coronal soft · coronal · 0.33mm/px · 3 of 66 slices shown]
[im 14/66  bone]
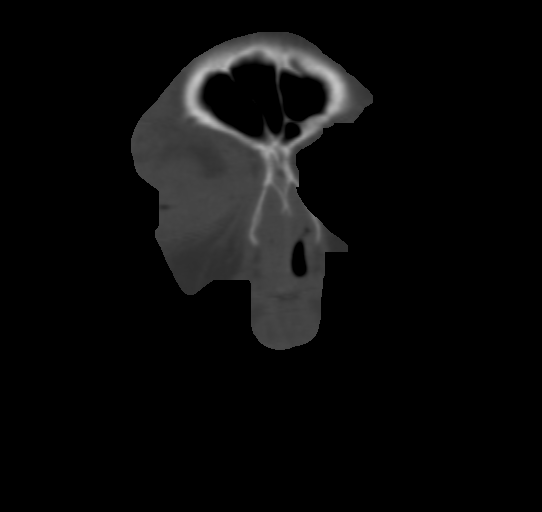
[im 27/66  bone]
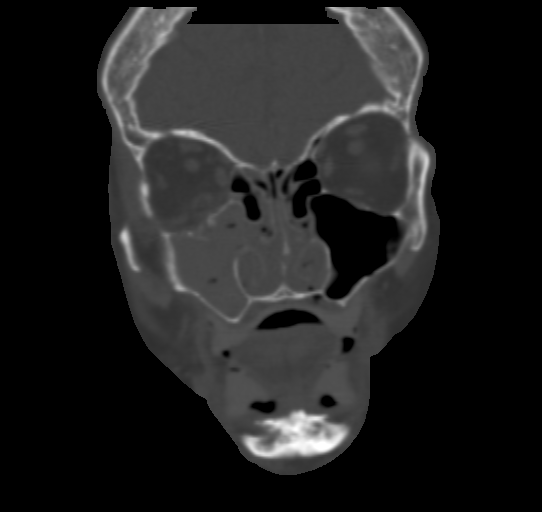
[im 40/66  bone]
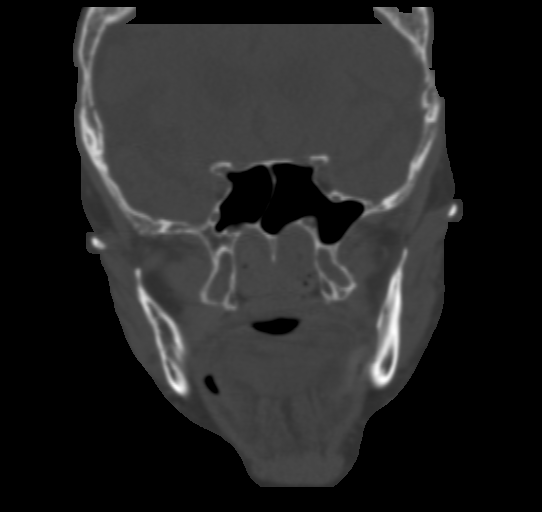

[Series 19: sagittal bone · sagittal · 0.29mm/px · 3 of 70 slices shown]
[im 18/70  bone]
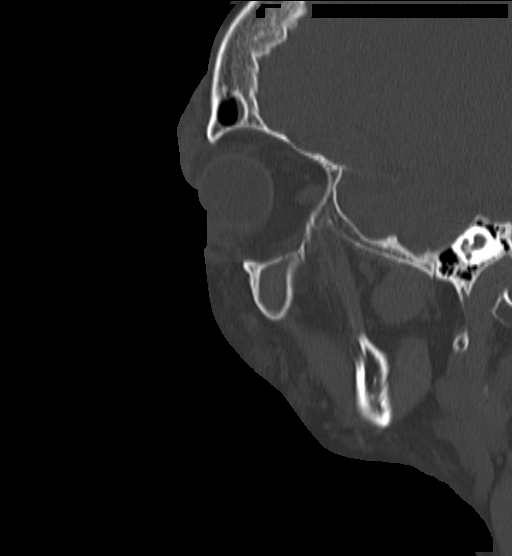
[im 35/70  bone]
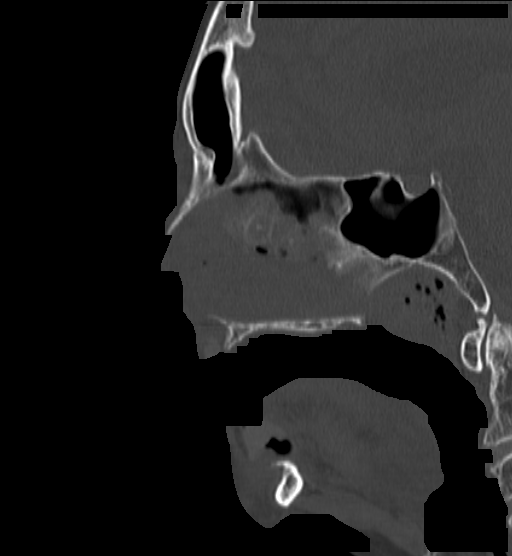
[im 52/70  bone]
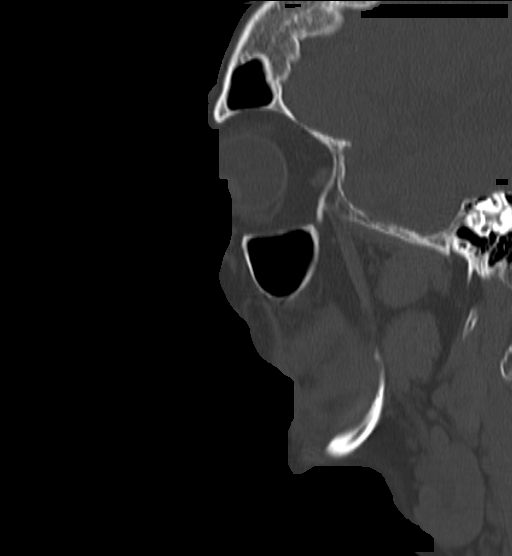

[9 of 33 positions shown; findings below may reference images not displayed]

FINDINGS: CT HEAD FINDINGS

Generalized atrophy.

Normal ventricular morphology.

No midline shift or mass effect.

Small vessel chronic ischemic changes of deep cerebral white matter.

No intracranial hemorrhage, mass lesion, or evidence acute
infarction.

Old infarct at anterior limb LEFT internal capsule.

No extra-axial fluid collections.

Bones demineralized.

Calvaria intact.

CT MAXILLOFACIAL FINDINGS

Diffuse osseous demineralization.

Hyperostosis frontalis interna.

Air-fluid levels in the sphenoid, frontal and LEFT maxillary sinuses
with complete opacification of the RIGHT maxillary sinus.

Fractures involving the anterior and lateral walls of RIGHT
maxillary sinus.

Questionable fractures at the medial walls of both maxillary
sinuses.

Fracture inferior wall RIGHT orbit, with a displaced fragment into
the RIGHT maxillary sinus.

Mastoid air cells and middle ear cavities clear.

Small osteoma of the LEFT frontal sinus.

No definite additional facial bone fractures identified.

Significant RIGHT periorbital hematoma.

Intraorbital soft tissue planes clear.

CT CERVICAL SPINE FINDINGS

Visualized skullbase intact.

Diffuse osseous demineralization.

Accentuated cervical lordosis.

Vertebral body heights maintained without fracture or subluxation.

Slight posterior disc space narrowing at C3-C4 and C4-C5 less C5-C6.

Odontoid appears grossly intact.

Prevertebral soft tissues normal thickness.

Mild scattered facet degenerative changes cervical spine.

Mild biapical scarring.
IMPRESSION: Atrophy with small vessel chronic ischemic changes of deep cerebral
white matter.

Old lacunar infarct anterior limb LEFT internal capsule.

No acute intracranial abnormalities.

Osseous demineralization with scattered facet degenerative changes
cervical spine.

No acute cervical spine abnormalities.

Facial bone fractures involving the anterior and lateral walls of
the RIGHT maxillary sinus, inferior wall RIGHT orbit, and
questionably the medial walls of both maxillary sinuses.

## 2016-08-24 IMAGING — CR RIGHT HAND - COMPLETE 3+ VIEW
1 series · 3 of 3 positions shown · non-contrast
Comparison: None.

CLINICAL DATA: Pain following fall

EXAM:
RIGHT HAND - COMPLETE 3+ VIEW

[Series 1: pa · 0.17mm/px · 3 of 3 slices shown]
[im 1/3]
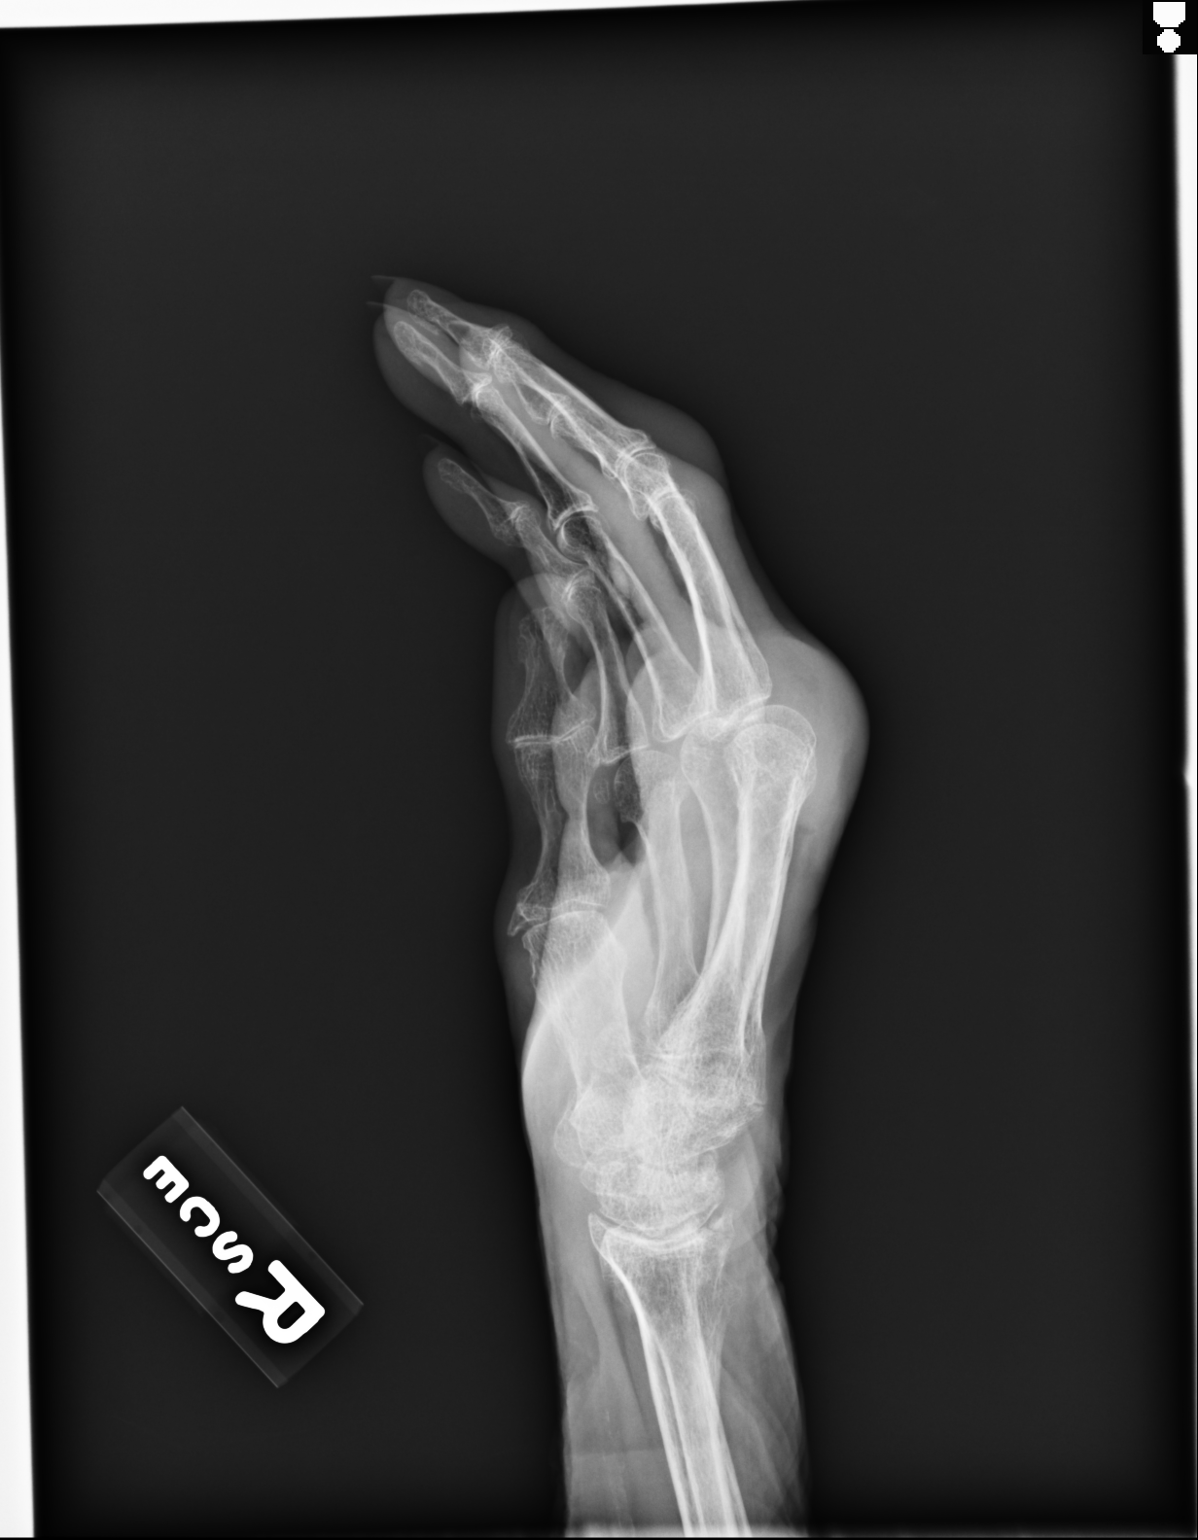
[im 2/3]
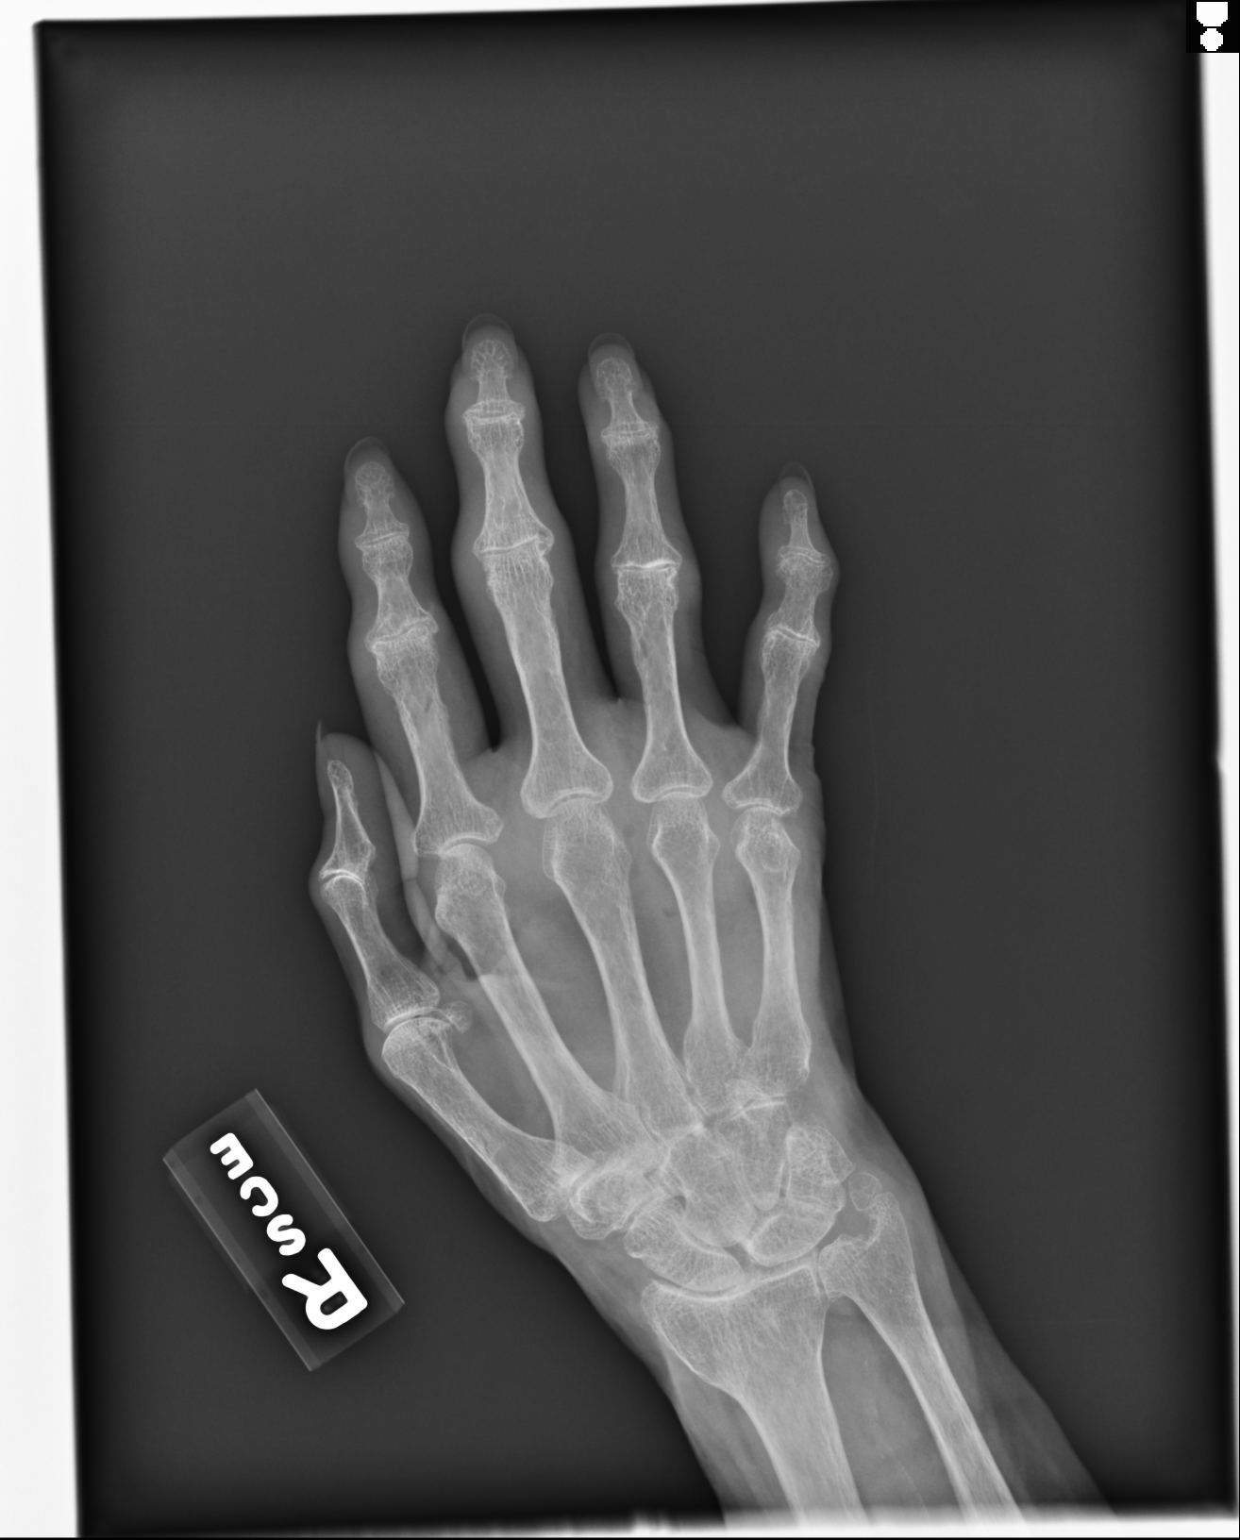
[im 3/3]
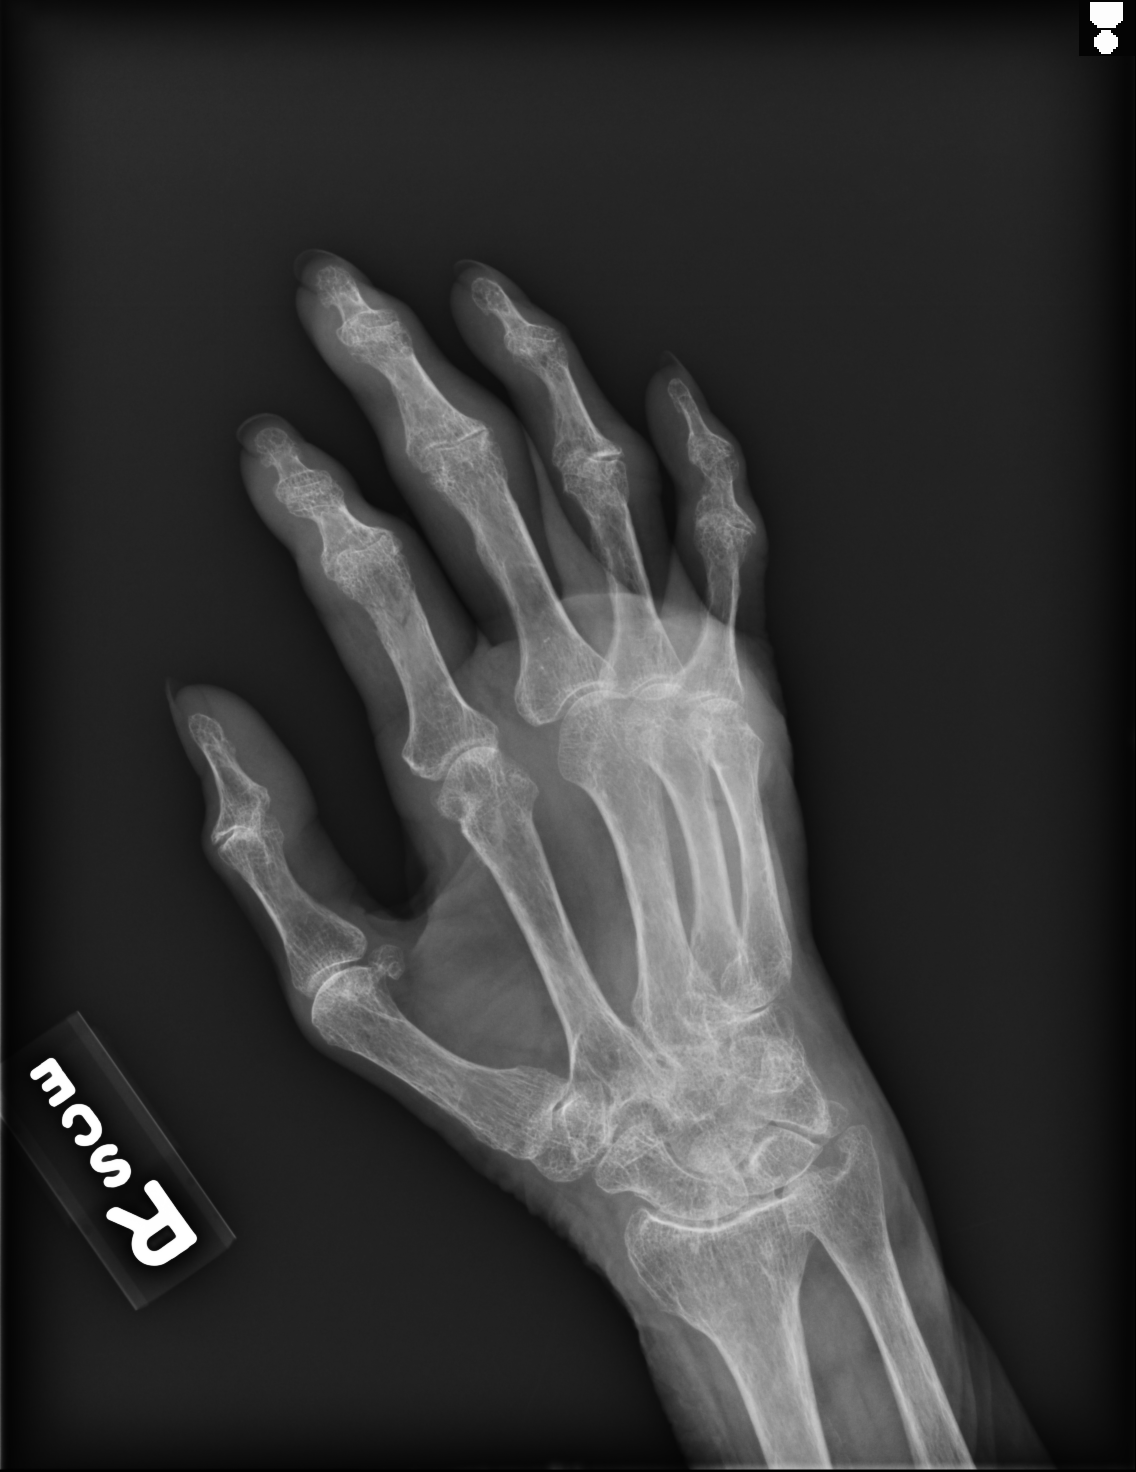

[3 of 3 positions shown; findings below may reference images not displayed]

FINDINGS: Frontal, oblique, and lateral views were obtained. There is swelling
dorsally over the MCP joints. No fracture or dislocation is
apparent. There is an narrowing of all MCP, PIP, and DIP joints.
There is spur formation in all PIP and DIP joints. There is moderate
narrowing of the scaphotrapezial and saddle joints. There is also
moderate narrowing in the radiocarpal joint region. There is
calcification in the triangular fibrocartilage region. Bones are
somewhat osteoporotic. There are no erosions or areas of
periostitis.
IMPRESSION: Extensive osteoarthritic change. Soft tissue swelling dorsally may
represent hematoma formation. There is also soft tissue swelling of
several digits which most likely is of arthropathic etiology. No
acute fracture or dislocation. No erosions. Bones are somewhat
osteoporotic.

## 2016-12-26 ENCOUNTER — Emergency Department
Admission: EM | Admit: 2016-12-26 | Discharge: 2016-12-26 | Disposition: A | Discharge status: Home / Self Care | Payer: Medicare Other | Attending: Emergency Medicine | Admitting: Emergency Medicine

## 2016-12-26 ENCOUNTER — Encounter: Payer: Self-pay | Admitting: *Deleted

## 2016-12-26 ENCOUNTER — Emergency Department: Payer: Medicare Other

## 2016-12-26 DIAGNOSIS — J45909 Unspecified asthma, uncomplicated: Secondary | ICD-10-CM | POA: Insufficient documentation

## 2016-12-26 DIAGNOSIS — S2242XA Multiple fractures of ribs, left side, initial encounter for closed fracture: Secondary | ICD-10-CM | POA: Insufficient documentation

## 2016-12-26 DIAGNOSIS — Z87891 Personal history of nicotine dependence: Secondary | ICD-10-CM | POA: Insufficient documentation

## 2016-12-26 DIAGNOSIS — I10 Essential (primary) hypertension: Secondary | ICD-10-CM | POA: Diagnosis not present

## 2016-12-26 DIAGNOSIS — S0990XA Unspecified injury of head, initial encounter: Secondary | ICD-10-CM | POA: Insufficient documentation

## 2016-12-26 DIAGNOSIS — J449 Chronic obstructive pulmonary disease, unspecified: Secondary | ICD-10-CM | POA: Diagnosis not present

## 2016-12-26 DIAGNOSIS — Y9301 Activity, walking, marching and hiking: Secondary | ICD-10-CM | POA: Diagnosis not present

## 2016-12-26 DIAGNOSIS — S42022K Displaced fracture of shaft of left clavicle, subsequent encounter for fracture with nonunion: Secondary | ICD-10-CM

## 2016-12-26 DIAGNOSIS — Y999 Unspecified external cause status: Secondary | ICD-10-CM | POA: Insufficient documentation

## 2016-12-26 DIAGNOSIS — Y929 Unspecified place or not applicable: Secondary | ICD-10-CM | POA: Insufficient documentation

## 2016-12-26 DIAGNOSIS — W01198A Fall on same level from slipping, tripping and stumbling with subsequent striking against other object, initial encounter: Secondary | ICD-10-CM | POA: Diagnosis not present

## 2016-12-26 DIAGNOSIS — S42022A Displaced fracture of shaft of left clavicle, initial encounter for closed fracture: Secondary | ICD-10-CM | POA: Insufficient documentation

## 2016-12-26 DIAGNOSIS — W19XXXA Unspecified fall, initial encounter: Secondary | ICD-10-CM

## 2016-12-26 DIAGNOSIS — S4992XA Unspecified injury of left shoulder and upper arm, initial encounter: Secondary | ICD-10-CM | POA: Diagnosis present

## 2016-12-26 MED ORDER — ACETAMINOPHEN 325 MG PO TABS
650.0000 mg | ORAL_TABLET | Freq: Once | ORAL | Status: AC
  Administered 2016-12-26: 650 mg via ORAL
  Filled 2016-12-26: qty 2

## 2016-12-26 NOTE — ED Notes (Signed)
Attempted to call the [facility] 3 times without an answer. Pt reports she does not have family to pick her up and that she normally is sent home via EMS. EMS called and at ED ready to take pt home. Pt has immobilizer and brace on. Education has been provided on incentive spirometer. Pt sent home with discharge papers and DNR. PT in NAD at this time.

## 2016-12-26 NOTE — ED Notes (Signed)
Both clavical strap and shoulder immobilizer placed. Pt instructed on how to adjust and remove braces as needed to change and shower. RN called [facility] to also report the braces to staff. Pt reports she has someone at the [facility] she can call for assistance tomorrow.

## 2016-12-26 NOTE — ED Triage Notes (Signed)
Pt to ED from the [facility] after falling against a wall. Pt was walking with walker and someone was walking through a door and pt attempted to back up to left them through and lost her balance and fell into a wall. Pt has left shoulder pain and reports left side of head hit the wall. Pt denies LOC. Pt deneis dizziness or lightheadedness at this time. Alert and oriented to self, place and time.

## 2016-12-26 NOTE — ED Notes (Signed)
MD at bedside. 

## 2016-12-26 NOTE — ED Notes (Signed)
Patient transported to CT 

## 2016-12-26 NOTE — Discharge Instructions (Signed)
Please keep your clavicle strap and your immobilizer on at all times to help your clavicle fracture heal. You may take Tylenol for pain.  Return to the emergency department for severe pain, if you notice skin changes around your fracture site, sudden shortness of breath, or any other symptoms concerning to you.

## 2016-12-26 NOTE — ED Provider Notes (Signed)
[facility] Emergency Department Provider Note  ____________________________________________  Time seen: Approximately 5:23 PM  I have reviewed the triage vital signs and the nursing notes.   HISTORY  Chief Complaint Fall    HPI Crystal Todd is a 83 y.o. female with a history of arthritis, osteoporosis presenting with left shoulder pain after mechanical fall. The patient reports that she was standing at a vending machine, took a step back to let someone else pass, and she lost her balance, striking the wall with her left shoulder and left side of her head. She did fall down but did not lose consciousness. She was able to ambulate afterwards. She is not having any headache, visual changes, speech changes, nausea or vomiting. She did not have any associated chest pain, shortness of breath, palpitations, lightheadedness or syncope. She has not tried anything for her pain.   Past Medical History:  Diagnosis Date  . Arthritis    hands, knees  . Asthma   . COPD (chronic obstructive pulmonary disease) (HCC)   . Dysrhythmia    A-Fib  . Full dentures    upper and lower  . GERD (gastroesophageal reflux disease)   . Hypercholesteremia   . Hypertension   . Osteoporosis   . Shortness of breath dyspnea     There are no active problems to display for this patient.   Past Surgical History:  Procedure Laterality Date  . BACK SURGERY     "cemented" discs  . CATARACT EXTRACTION W/PHACO Left 10/12/2014   Procedure: CATARACT EXTRACTION PHACO AND INTRAOCULAR LENS PLACEMENT (IOC);  Surgeon: Lockie Mola, MD;  Location: [facility];  Service: Ophthalmology;  Laterality: Left;  . ESOPHAGOGASTRODUODENOSCOPY (EGD) WITH ESOPHAGEAL DILATION    . JOINT REPLACEMENT     hip  . WRIST FRACTURE SURGERY Bilateral     Current Outpatient Rx  . Order #: [id number] Class: Historical Med  . Order #: [id number] Class: Historical Med  . Order #: [id number] Class:  Historical Med  . Order #: [id number] Class: Historical Med  . Order #: [id number] Class: Historical Med  . Order #: [id number] Class: Historical Med  . Order #: [id number] Class: Historical Med  . Order #: [id number] Class: Historical Med  . Order #: [id number] Class: Historical Med  . Order #: [id number] Class: Historical Med  . Order #: [id number] Class: Historical Med  . Order #: [id number] Class: Historical Med  . Order #: [id number] Class: Historical Med  . Order #: [id number] Class: Historical Med  . Order #: [id number] Class: Historical Med    Allergies Ace inhibitors; Alendronate; and Influenza vaccines  History reviewed. No pertinent family history.  Social History Social History  Substance Use Topics  . Smoking status: Former Smoker    Quit date: 04/08/1974  . Smokeless tobacco: Never Used  . Alcohol use No    Review of Systems Constitutional: No fever/chills.No lightheadedness or syncope. No loss of consciousness. Positive mechanical fall. Eyes: No visual changes. No blurred or double vision. ENT: No sore throat. No congestion or rhinorrhea. Cardiovascular: Denies chest pain. Denies palpitations. Respiratory: Denies shortness of breath.  No cough. Gastrointestinal: No abdominal pain.  No nausea, no vomiting.  No diarrhea.  No constipation. Genitourinary: Negative for dysuria. Musculoskeletal: Negative for back pain. Negative for neck pain. Positive for left shoulder pain. Skin: Negative for rash. Neurological: Negative for headaches. No focal numbness, tingling or weakness.     ____________________________________________   PHYSICAL EXAM:  VITAL SIGNS: ED Triage Vitals  Enc Vitals Group     BP 12/26/16 1636 (!) 167/75  Pulse Rate 12/26/16 1636 66     Resp 12/26/16 1636 15     Temp 12/26/16 1636 97.8 F (36.6 C)     Temp Source 12/26/16 1636 Oral     SpO2 12/26/16 1636 96 %     Weight 12/26/16 1637 104 lb (47.2 kg)     Height --      Head Circumference --      Peak  Flow --      Pain Score 12/26/16 1634 10     Pain Loc --      Pain Edu? --      Excl. in GC? --     Constitutional: Alert and oriented. Well appearing For her age and in no acute distress. Answers questions appropriately. Eyes: Conjunctivae are normal.  EOMI. PERRLA. No scleral icterus. Head: Atraumatic. No Battle sign. Nose: No congestion/rhinnorhea. No swelling over the nose or septal hematoma. Mouth/Throat: Mucous membranes are moist. No dental injury or malocclusion. Neck: No stridor.  Supple.  No midline C-spine tenderness to palpation, step-offs or deformities. Cardiovascular: Normal rate, regular rhythm. No murmurs, rubs or gallops. The patient does have some tenderness to palpation over the most distal portion of the clavicle without any tenting, obvious deformity, or crepitus. Respiratory: Normal respiratory effort.  No accessory muscle use or retractions. Lungs CTAB.  No wheezes, rales or ronchi. Gastrointestinal: Soft, nontender and nondistended.  No guarding or rebound.  No peritoneal signs. Musculoskeletal: Pelvis is stable. Full range of motion of the bilateral wrists, elbows, right shoulder, bilateral hips, knees and ankles without pain. The patient does have some tenderness to palpation over the left humeral head with pain with range of motion. No LE edema. No ttp in the calves or palpable cords.  Negative Homan's sign. The patient has severe kyphosis of the thoracic spine but no midline tenderness to palpation, step-offs or deformities in the T or the L-spine. Neurologic:  A&Ox3.  Speech is clear.  Face and smile are symmetric.  EOMI.  PERRLA. Moves all extremities . Skin:  Skin is warm, dry and intact. No rash noted. Psychiatric: Mood and affect are normal.   ____________________________________________   LABS (all labs ordered are listed, but only abnormal results are displayed)  Labs Reviewed - No data to display ____________________________________________  EKG  ED  ECG REPORT I, Rockne Menghini, the attending physician, personally viewed and interpreted this ECG.   Date: 12/26/2016  EKG Time: 1634  Rate: 67  Rhythm: normal sinus rhythm  Axis: leftward  Intervals:none  ST&T Change: No STEMI   ____________________________________________  RADIOLOGY  Dg Clavicle Left  Result Date: 12/26/2016 CLINICAL DATA:  Fall with shoulder pain EXAM: LEFT CLAVICLE - 2+ VIEWS COMPARISON:  06/18/2011 FINDINGS: There appears to be prior underlying deformity of the mid left clavicle. There is additional acute fracture involving the mid segment of left clavicle with moderate superior angulation of fracture apex. Possible left upper rib fractures but difficult visualization due to osteopenia. IMPRESSION: 1. Acute angulated fracture involving mid segment of left clavicle which appears to be in the region of an old clavicular deformity. 2. Possible left upper rib fractures. No pneumothorax at the left lung apex. Electronically Signed   By: Jasmine Pang M.D.   On: 12/26/2016 18:17   Ct Head Wo Contrast  Result Date: 12/26/2016 CLINICAL DATA:  Fall, left side of head hit wall EXAM: CT HEAD WITHOUT CONTRAST TECHNIQUE: Contiguous axial images were obtained from the base of the skull through the vertex without  intravenous contrast. COMPARISON:  06/07/2014 FINDINGS: Brain: No acute territorial infarction, hemorrhage, or intracranial mass is seen. Mild small vessel ischemic changes of the white matter. Old left basal ganglia infarct. Stable ventricle size. Moderate atrophy. Vascular: No hyperdense vessels.  Carotid artery calcification. Skull: No fracture seen. Fluid within the left mastoid air cells and middle ear Sinuses/Orbits: Mild mucosal thickening in the ethmoid sinuses. Postsurgical change of the right maxillary sinus. No acute orbital abnormality. Bilateral lens extraction. Other: None IMPRESSION: 1. No CT evidence for acute intracranial abnormality. Atrophy and mild  small vessel ischemic changes of the white matter 2. Fluid within the left mastoid air cells and middle ear. Electronically Signed   By: Jasmine Pang M.D.   On: 12/26/2016 18:12   Dg Shoulder Left  Result Date: 12/26/2016 CLINICAL DATA:  Fall with shoulder pain EXAM: LEFT SHOULDER - 2+ VIEW COMPARISON:  Chest x-ray 06/18/2011 FINDINGS: [facility] joint appears intact. No fracture or malalignment at the glenohumeral interval. Angulated fracture involving mid segment of left clavicle. Possible left upper rib fractures. IMPRESSION: 1. Angulated fracture involving the mid segment of left clavicle 2. No acute fracture or malalignment at the glenohumeral interval. Electronically Signed   By: Jasmine Pang M.D.   On: 12/26/2016 18:15    ____________________________________________   PROCEDURES  Procedure(s) performed: None  Procedures  Critical Care performed: No ____________________________________________   INITIAL IMPRESSION / ASSESSMENT AND PLAN / ED COURSE  Pertinent labs & imaging results that were available during my care of the patient were reviewed by me and considered in my medical decision making (see chart for details).  83 y.o. female with osteoporosis and arthritis presenting with left shoulder and distal clavicular pain after fall. She did fall and hit her head, also get a CT scan to rule out any acute intracranial abnormality. We'll get plain x-rays of the clavicle and left shoulder. I will treat the patient's pain with Tylenol. We'll get a screening EKG, but there is no history suggestive of an arrhythmia or other syncope or cause for the patient's fall.  ----------------------------------------- 7:39 PM on 12/26/2016 -----------------------------------------  The patient CT head does not show any acute intracranial process. She does have admitted clavicular fracture with significant angulation on the left but no evidence of shoulder fracture, or underlying pneumothorax. At this  time, the patient is no tenting of the skin. She may also have some rib fractures. I'll plan to place the patient in a clavicular strap and shoulder immobilizer, and I spoke to Dr. Hyacinth Meeker who will see the patient in clinic Monday or Tuesday. She will use Tylenol for her pain at home, as she is quite comfortable here and I'm reticent to prescribe narcotics to an elderly patient due to the risk of falls or other associated side effects. The patient may also have some rib fractures, so sent her home with his incentive spirometer.  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

## 2017-01-23 ENCOUNTER — Emergency Department: Payer: Medicare Other

## 2017-01-23 ENCOUNTER — Emergency Department
Admission: EM | Admit: 2017-01-23 | Discharge: 2017-01-24 | Disposition: A | Discharge status: Home / Self Care | Payer: Medicare Other | Attending: Emergency Medicine | Admitting: Emergency Medicine

## 2017-01-23 ENCOUNTER — Encounter: Payer: Self-pay | Admitting: Emergency Medicine

## 2017-01-23 DIAGNOSIS — J45909 Unspecified asthma, uncomplicated: Secondary | ICD-10-CM | POA: Diagnosis not present

## 2017-01-23 DIAGNOSIS — Z79899 Other long term (current) drug therapy: Secondary | ICD-10-CM | POA: Diagnosis not present

## 2017-01-23 DIAGNOSIS — S0101XA Laceration without foreign body of scalp, initial encounter: Secondary | ICD-10-CM

## 2017-01-23 DIAGNOSIS — Y9389 Activity, other specified: Secondary | ICD-10-CM | POA: Diagnosis not present

## 2017-01-23 DIAGNOSIS — I10 Essential (primary) hypertension: Secondary | ICD-10-CM | POA: Diagnosis not present

## 2017-01-23 DIAGNOSIS — Z87891 Personal history of nicotine dependence: Secondary | ICD-10-CM | POA: Diagnosis not present

## 2017-01-23 DIAGNOSIS — T148XXA Other injury of unspecified body region, initial encounter: Secondary | ICD-10-CM

## 2017-01-23 DIAGNOSIS — W0110XA Fall on same level from slipping, tripping and stumbling with subsequent striking against unspecified object, initial encounter: Secondary | ICD-10-CM | POA: Diagnosis not present

## 2017-01-23 DIAGNOSIS — S0990XA Unspecified injury of head, initial encounter: Secondary | ICD-10-CM

## 2017-01-23 DIAGNOSIS — S42215A Unspecified nondisplaced fracture of surgical neck of left humerus, initial encounter for closed fracture: Secondary | ICD-10-CM | POA: Diagnosis not present

## 2017-01-23 DIAGNOSIS — Y92121 Bathroom in nursing home as the place of occurrence of the external cause: Secondary | ICD-10-CM | POA: Insufficient documentation

## 2017-01-23 DIAGNOSIS — R42 Dizziness and giddiness: Secondary | ICD-10-CM | POA: Diagnosis not present

## 2017-01-23 DIAGNOSIS — Z23 Encounter for immunization: Secondary | ICD-10-CM | POA: Diagnosis not present

## 2017-01-23 DIAGNOSIS — J449 Chronic obstructive pulmonary disease, unspecified: Secondary | ICD-10-CM | POA: Diagnosis not present

## 2017-01-23 DIAGNOSIS — Y998 Other external cause status: Secondary | ICD-10-CM | POA: Insufficient documentation

## 2017-01-23 DIAGNOSIS — W19XXXA Unspecified fall, initial encounter: Secondary | ICD-10-CM

## 2017-01-23 HISTORY — DX: Paroxysmal atrial fibrillation: I48.0

## 2017-01-23 LAB — URINALYSIS, COMPLETE (UACMP) WITH MICROSCOPIC
BACTERIA UA: NONE SEEN
BILIRUBIN URINE: NEGATIVE
Glucose, UA: NEGATIVE mg/dL
Hgb urine dipstick: NEGATIVE
Ketones, ur: NEGATIVE mg/dL
NITRITE: NEGATIVE
PROTEIN: NEGATIVE mg/dL
SPECIFIC GRAVITY, URINE: 1.009 (ref 1.005–1.030)
SQUAMOUS EPITHELIAL / LPF: NONE SEEN
pH: 6 (ref 5.0–8.0)

## 2017-01-23 NOTE — ED Notes (Signed)
Patient transported to CT 

## 2017-01-23 NOTE — ED Triage Notes (Signed)
Pt arrives via [facility] from the [facility] [place] s/p fall. Pt fell 4 weeks ago and fractured left shoulder. Pt fell on left shoulder and also hit her head. Pt has a small laceration to the left lateral head. Bleeding controlled at this time. Pt denies taking any blood thinners; none listed on MAR from facility.

## 2017-01-24 DIAGNOSIS — S42215A Unspecified nondisplaced fracture of surgical neck of left humerus, initial encounter for closed fracture: Secondary | ICD-10-CM | POA: Diagnosis not present

## 2017-01-24 LAB — BASIC METABOLIC PANEL
ANION GAP: 9 (ref 5–15)
BUN: 15 mg/dL (ref 6–20)
CHLORIDE: 106 mmol/L (ref 101–111)
CO2: 27 mmol/L (ref 22–32)
Calcium: 8.4 mg/dL — ABNORMAL LOW (ref 8.9–10.3)
Creatinine, Ser: 0.63 mg/dL (ref 0.44–1.00)
GFR calc Af Amer: >60 mL/min (ref >60)
GLUCOSE: 108 mg/dL — AB (ref 65–99)
POTASSIUM: 3.8 mmol/L (ref 3.5–5.1)
Sodium: 142 mmol/L (ref 135–145)

## 2017-01-24 LAB — CBC
HEMATOCRIT: 37.1 % (ref 35.0–47.0)
HEMOGLOBIN: 12.1 g/dL (ref 12.0–16.0)
MCH: 28.7 pg (ref 26.0–34.0)
MCHC: 32.6 g/dL (ref 32.0–36.0)
MCV: 88.1 fL (ref 80.0–100.0)
Platelets: 185 10*3/uL (ref 150–440)
RBC: 4.21 MIL/uL (ref 3.80–5.20)
RDW: 14.3 % (ref 11.5–14.5)
WBC: 5.9 10*3/uL (ref 3.6–11.0)

## 2017-01-24 LAB — TROPONIN I: Troponin I: <0.03 ng/mL (ref <0.03)

## 2017-01-24 MED ORDER — SODIUM CHLORIDE 0.9 % IV BOLUS (SEPSIS)
500.0000 mL | Freq: Once | INTRAVENOUS | Status: AC
  Administered 2017-01-24: 500 mL via INTRAVENOUS

## 2017-01-24 MED ORDER — TETANUS-DIPHTH-ACELL PERTUSSIS 5-2.5-18.5 LF-MCG/0.5 IM SUSP
0.5000 mL | Freq: Once | INTRAMUSCULAR | Status: AC
  Administered 2017-01-24: 0.5 mL via INTRAMUSCULAR
  Filled 2017-01-24: qty 0.5

## 2017-01-24 MED ORDER — TETANUS-DIPHTHERIA TOXOIDS TD 5-2 LFU IM INJ: 0.5000 mL | INJECTION | Freq: Once | INTRAMUSCULAR | Status: DC

## 2017-01-24 NOTE — ED Notes (Signed)
Cleaned laceration and surrounding area with NS irritation and gauze. Pt tolerated this well.

## 2017-01-24 NOTE — ED Provider Notes (Signed)
[facility] Emergency Department Provider Note   ____________________________________________   First MD Initiated Contact with Patient 01/23/17 2326     (approximate)  I have reviewed the triage vital signs and the nursing notes.   HISTORY  Chief Complaint Fall    HPI Crystal Todd is a 83 y.o. female brought to the ED from the [place] status post fall with left shoulder and head injury. Patient reports she stood up to ambulate to the restroom, became dizzy and fell, striking her left shoulder and left head. Denies LOC. Denies anticoagulant use.Denies headache, neck pain, vision changes, chest pain, shortness of breath, abdominal pain, nausea, vomiting, dysuria. Denies recent travel. Wearing shoulder immobilizer for left clavicular fracture sustained in a fall almost 1 month ago. No longer feeling dizzy or lightheaded.   Past Medical History:  Diagnosis Date  . Arthritis    hands, knees  . Asthma   . COPD (chronic obstructive pulmonary disease) (HCC)   . Dysrhythmia    A-Fib  . Full dentures    upper and lower  . GERD (gastroesophageal reflux disease)   . Hypercholesteremia   . Hypertension   . Osteoporosis   . Paroxysmal atrial fibrillation (HCC)   . Shortness of breath dyspnea     There are no active problems to display for this patient.   Past Surgical History:  Procedure Laterality Date  . BACK SURGERY     "cemented" discs  . CATARACT EXTRACTION W/PHACO Left 10/12/2014   Procedure: CATARACT EXTRACTION PHACO AND INTRAOCULAR LENS PLACEMENT (IOC);  Surgeon: Lockie Mola, MD;  Location: [facility];  Service: Ophthalmology;  Laterality: Left;  . ESOPHAGOGASTRODUODENOSCOPY (EGD) WITH ESOPHAGEAL DILATION    . JOINT REPLACEMENT     hip  . WRIST FRACTURE SURGERY Bilateral     Prior to Admission medications   Medication Sig Start Date End Date Taking? Authorizing Provider  acetaminophen (TYLENOL) 325 MG tablet Take 650 mg  by mouth every 4 (four) hours as needed for fever (up to 101).    [provider]  alum & mag hydroxide-simeth (ANTACID) 200-200-20 MG/5ML suspension Take 30 mLs by mouth every 6 (six) hours as needed for indigestion or heartburn.    [provider]  amLODipine (NORVASC) 5 MG tablet Take 5 mg by mouth daily.    [provider]  cholecalciferol (VITAMIN D) 1000 units tablet Take 1,000 Units by mouth daily.    [provider]  diphenhydrAMINE (BENADRYL) 25 MG tablet Take 25 mg by mouth every 6 (six) hours as needed for allergies.    [provider]  divalproex (DEPAKOTE SPRINKLE) 125 MG capsule Take 125-250 mg by mouth See admin instructions. Take 1 capsule by mouth every morning, and take 2 capsules (250 mg) by mouth every night at bedtime.    [provider]  fluticasone (FLONASE) 50 MCG/ACT nasal spray Place 2 sprays into the nose 2 (two) times daily.    [provider]  guaiFENesin (ROBITUSSIN) 100 MG/5ML liquid Take 300 mg by mouth every 6 (six) hours as needed for cough.    [provider]  loperamide (IMODIUM A-D) 2 MG capsule Take 4 mg by mouth as needed for diarrhea or loose stools.    [provider]  magnesium hydroxide (MILK OF MAGNESIA) 400 MG/5ML suspension Take 30 mLs by mouth daily as needed for mild constipation.    [provider]  metoprolol tartrate (LOPRESSOR) 25 MG tablet Take 12.5 mg by mouth 2 (  two) times daily.    [provider]  Multiple Vitamins-Minerals (PRESERVISION AREDS 2) CAPS Take 1 capsule by mouth 2 (two) times daily.    [provider]  omeprazole (PRILOSEC) 20 MG capsule Take 20 mg by mouth daily.    [provider]  PARoxetine (PAXIL) 20 MG tablet Take 20 mg by mouth every morning.    [provider]  vitamin B-12 (CYANOCOBALAMIN) 1000 MCG tablet Take 1,000 mcg by mouth daily.    [provider]    Allergies Ace inhibitors;  Alendronate; and Influenza vaccines  No family history on file.  Social History Social History  Substance Use Topics  . Smoking status: Former Smoker    Quit date: 04/08/1974  . Smokeless tobacco: Never Used  . Alcohol use No    Review of Systems  Constitutional: No fever/chills. Eyes: No visual changes. ENT: No sore throat. Cardiovascular: Denies chest pain. Respiratory: Denies shortness of breath. Gastrointestinal: No abdominal pain.  No nausea, no vomiting.  No diarrhea.  No constipation. Genitourinary: Negative for dysuria. Musculoskeletal: positive for left shoulder pain. Negative for back pain. Skin: Negative for rash. Neurological: positive for dizziness and minor head injury. Negative for headaches, focal weakness or numbness.   ____________________________________________   PHYSICAL EXAM:  VITAL SIGNS: ED Triage Vitals  Enc Vitals Group     BP 01/23/17 2300 (!) 175/81     Pulse Rate 01/23/17 2300 79     Resp 01/23/17 2300 16     Temp 01/23/17 2300 97.8 F (36.6 C)     Temp Source 01/23/17 2300 Oral     SpO2 01/23/17 2258 94 %     Weight 01/23/17 2300 104 lb (47.2 kg)     Height 01/23/17 2300 4\' 11"  (1.499 m)     Head Circumference --      Peak Flow --      Pain Score 01/23/17 2258 10     Pain Loc --      Pain Edu? --      Excl. in GC? --     Constitutional: Alert and oriented. Well appearing and in no acute distress. Eyes: Conjunctivae are normal. PERRL. EOMI. Head: Dried blood noted to posterior scalp. No obvious laceration noted. Nose: No congestion/rhinnorhea. Mouth/Throat: Mucous membranes are moist.  Oropharynx non-erythematous. Neck: No stridor.  No cervical spine tenderness to palpation.  No carotid bruits. Cardiovascular: Normal rate, regular rhythm. Grossly normal heart sounds.  Good peripheral circulation. Respiratory: Normal respiratory effort.  No retractions. Lungs CTAB. Gastrointestinal: Soft and nontender. No distention. No abdominal  bruits. No CVA tenderness. Musculoskeletal:  LUE: Old ecchymosis to shoulder. Tender to palpation upper arm with limited range of motion secondary to pain. 2+ radial pulses. Brisk, less than 5 second capillary refill. Neurologic: Alert and oriented to person and place. CN II-XII grossly intact. MAEx4. No focal neurological deficits are appreciated.  Skin:  Skin is warm, dry and intact. No rash noted. Psychiatric: Mood and affect are normal. Speech and behavior are normal.  ____________________________________________   LABS (all labs ordered are listed, but only abnormal results are displayed)  Labs Reviewed  BASIC METABOLIC PANEL - Abnormal; Notable for the following:       Result Value   Glucose, Bld 108 (*)    Calcium 8.4 (*)    All other components within normal limits  URINALYSIS, COMPLETE (UACMP) WITH MICROSCOPIC - Abnormal; Notable for the following:    Color, Urine YELLOW (*)    APPearance CLEAR (*)  Leukocytes, UA TRACE (*)    All other components within normal limits  CBC  TROPONIN I   ____________________________________________  EKG  ED ECG REPORT I, Satsuki Zillmer J, the attending physician, personally viewed and interpreted this ECG.   Date: 01/24/2017  EKG Time: 2303  Rate: 63  Rhythm: normal EKG, normal sinus rhythm  Axis: normal  Intervals:none  ST&T Change: nonspecific  ____________________________________________  RADIOLOGY  Ct Head Wo Contrast  Result Date: 01/24/2017 CLINICAL DATA:  Patient fell 4 weeks ago with fracture to the left shoulder. Patient fell again today. Small laceration to the left lateral head. EXAM: CT HEAD WITHOUT CONTRAST TECHNIQUE: Contiguous axial images were obtained from the base of the skull through the vertex without intravenous contrast. COMPARISON:  12/26/2016 FINDINGS: Brain: Diffuse cerebral atrophy. Mild ventricular dilatation consistent with central atrophy. Low-attenuation changes in the deep white matter consistent  with small vessel ischemia. Old lacunar infarct in the left putamen. No evidence of acute infarction, hemorrhage, hydrocephalus, extra-axial collection or mass lesion/mass effect. Vascular: Vertebrobasilar and internal carotid artery calcifications. Skull: Calvarium appears intact. Sinuses/Orbits: Paranasal sinuses are clear. Diffuse left mastoid effusion without change since prior study. Right mastoid air cells are clear. Other: Calcification along the right side of the falx may represent dystrophic calcification or a small meningioma. No change since prior study. Subcutaneous scalp hematoma over the left posterior parietal region. IMPRESSION: No acute intracranial abnormalities. Chronic atrophy and small vessel ischemic changes. Chronic left mastoid effusion. Electronically Signed   By: Burman Nieves M.D.   On: 01/24/2017 00:03   Dg Shoulder Left  Result Date: 01/23/2017 CLINICAL DATA:  Patient fell 4 weeks ago and fracture of the left shoulder. Patient fell again today. EXAM: LEFT SHOULDER - 2+ VIEW COMPARISON:  12/26/2016 FINDINGS: Healing fracture of the midshaft left clavicle with inferior angulation of the distal fracture fragment. Coracoclavicular and acromioclavicular spaces are maintained. Diffuse bone demineralization. There is new cortical irregularity and linear lucency at the left humeral neck with mild angulation suggesting an impacted acute fracture. Possible focal extension to the base of the greater tuberosity. Degenerative changes in the spine. Old vertebral compression deformities with kyphoplasty changes at the thoracolumbar level. IMPRESSION: Acute impacted fracture of the left humeral neck with possible extension to the base of the greater tuberosity. Healing fracture of the midshaft left clavicle. Electronically Signed   By: Burman Nieves M.D.   On: 01/23/2017 23:56    ____________________________________________   PROCEDURES  Procedure(s) performed:  None  Procedures  Critical Care performed: No  ____________________________________________   INITIAL IMPRESSION / ASSESSMENT AND PLAN / ED COURSE  As part of my medical decision making, I reviewed the following data within the electronic MEDICAL RECORD NUMBER Nursing notes reviewed and incorporated, Labs reviewed, EKG interpreted, Radiograph reviewed and Notes from prior ED visits.   83 year old female who presents status post fall secondary to dizziness. Differential diagnosis includes but is not limited to neurological, metabolic, infectious etiologies. Recent fall with left clavicle fracture. Will obtain screening lab work, urinalysis, CT head, left shoulder x-ray and reassess. Patient denies pain and declines Tylenol at this time.  Clinical Course as of Jan 25 715  Fri Jan 24, 2017  0209 Head examined after irrigation. Small area of abrasion to posterior scalp with small hematoma. Nothing to staple or suture. Orthostatics noted. Patient denies dizziness or lightheadedness. Eager for discharge back to facility so she "can get some sleep". Updated patient of laboratory and urinalysis results. Patient ambulated to commode well  with light assistance; she usually ambulates with a walker. Trace leukocytes noted on urine without symptoms of frequency or dysuria. Strict return precautions given. Patient verbalizes understanding and agrees with plan of care.  [JS]    Clinical Course User Index [JS] Irean Hong, MD     ____________________________________________   FINAL CLINICAL IMPRESSION(S) / ED DIAGNOSES  Final diagnoses:  Fall, initial encounter  Closed nondisplaced fracture of surgical neck of left humerus, unspecified fracture morphology, initial encounter  Dizziness  Injury of head, initial encounter  Laceration of scalp, initial encounter  Abrasion      NEW MEDICATIONS STARTED DURING THIS VISIT:  Discharge Medication List as of 01/24/2017  3:06 AM       Note:  This  document was prepared using Dragon voice recognition software and may include unintentional dictation errors.    Irean Hong, MD 01/24/17 [phone]

## 2017-01-24 NOTE — Discharge Instructions (Signed)
1. Continue to wear your shoulder immobilizer. 2. You may take Tylenol as needed for pain. 3. Use your walker to help you balance as you walk. 4. Return to the ER for worsening symptoms, persistent vomiting, lethargy, difficulty breathing or other concerns.

## 2017-06-26 ENCOUNTER — Other Ambulatory Visit: Payer: Self-pay

## 2017-06-26 ENCOUNTER — Emergency Department: Payer: Medicare Other

## 2017-06-26 ENCOUNTER — Inpatient Hospital Stay
Admission: EM | Admit: 2017-06-26 | Discharge: 2017-06-30 | DRG: 193 | Disposition: A | Discharge status: Skilled Nursing Facility | Payer: Medicare Other | Attending: Internal Medicine | Admitting: Internal Medicine

## 2017-06-26 ENCOUNTER — Encounter: Payer: Self-pay | Admitting: Emergency Medicine

## 2017-06-26 DIAGNOSIS — E785 Hyperlipidemia, unspecified: Secondary | ICD-10-CM | POA: Diagnosis present

## 2017-06-26 DIAGNOSIS — M19041 Primary osteoarthritis, right hand: Secondary | ICD-10-CM | POA: Diagnosis present

## 2017-06-26 DIAGNOSIS — E7800 Pure hypercholesterolemia, unspecified: Secondary | ICD-10-CM | POA: Diagnosis present

## 2017-06-26 DIAGNOSIS — J101 Influenza due to other identified influenza virus with other respiratory manifestations: Secondary | ICD-10-CM | POA: Diagnosis present

## 2017-06-26 DIAGNOSIS — R197 Diarrhea, unspecified: Secondary | ICD-10-CM | POA: Diagnosis present

## 2017-06-26 DIAGNOSIS — M170 Bilateral primary osteoarthritis of knee: Secondary | ICD-10-CM | POA: Diagnosis present

## 2017-06-26 DIAGNOSIS — Z887 Allergy status to serum and vaccine status: Secondary | ICD-10-CM

## 2017-06-26 DIAGNOSIS — Z961 Presence of intraocular lens: Secondary | ICD-10-CM | POA: Diagnosis present

## 2017-06-26 DIAGNOSIS — Z87891 Personal history of nicotine dependence: Secondary | ICD-10-CM | POA: Diagnosis not present

## 2017-06-26 DIAGNOSIS — J441 Chronic obstructive pulmonary disease with (acute) exacerbation: Secondary | ICD-10-CM | POA: Diagnosis present

## 2017-06-26 DIAGNOSIS — I10 Essential (primary) hypertension: Secondary | ICD-10-CM | POA: Diagnosis present

## 2017-06-26 DIAGNOSIS — I480 Paroxysmal atrial fibrillation: Secondary | ICD-10-CM | POA: Diagnosis present

## 2017-06-26 DIAGNOSIS — Z972 Presence of dental prosthetic device (complete) (partial): Secondary | ICD-10-CM | POA: Diagnosis not present

## 2017-06-26 DIAGNOSIS — J9601 Acute respiratory failure with hypoxia: Secondary | ICD-10-CM | POA: Diagnosis present

## 2017-06-26 DIAGNOSIS — Z96649 Presence of unspecified artificial hip joint: Secondary | ICD-10-CM | POA: Diagnosis present

## 2017-06-26 DIAGNOSIS — Z79899 Other long term (current) drug therapy: Secondary | ICD-10-CM | POA: Diagnosis not present

## 2017-06-26 DIAGNOSIS — K219 Gastro-esophageal reflux disease without esophagitis: Secondary | ICD-10-CM | POA: Diagnosis present

## 2017-06-26 DIAGNOSIS — J189 Pneumonia, unspecified organism: Secondary | ICD-10-CM | POA: Diagnosis present

## 2017-06-26 DIAGNOSIS — M19042 Primary osteoarthritis, left hand: Secondary | ICD-10-CM | POA: Diagnosis present

## 2017-06-26 DIAGNOSIS — Z9842 Cataract extraction status, left eye: Secondary | ICD-10-CM | POA: Diagnosis not present

## 2017-06-26 DIAGNOSIS — M810 Age-related osteoporosis without current pathological fracture: Secondary | ICD-10-CM | POA: Diagnosis present

## 2017-06-26 DIAGNOSIS — Z888 Allergy status to other drugs, medicaments and biological substances status: Secondary | ICD-10-CM

## 2017-06-26 DIAGNOSIS — J181 Lobar pneumonia, unspecified organism: Secondary | ICD-10-CM

## 2017-06-26 LAB — BASIC METABOLIC PANEL
ANION GAP: 11 (ref 5–15)
BUN: 10 mg/dL (ref 6–20)
CALCIUM: 8.6 mg/dL — AB (ref 8.9–10.3)
CO2: 26 mmol/L (ref 22–32)
Chloride: 98 mmol/L — ABNORMAL LOW (ref 101–111)
Creatinine, Ser: 0.66 mg/dL (ref 0.44–1.00)
GFR calc Af Amer: >60 mL/min (ref >60)
GLUCOSE: 98 mg/dL (ref 65–99)
Potassium: 4.2 mmol/L (ref 3.5–5.1)
Sodium: 135 mmol/L (ref 135–145)

## 2017-06-26 LAB — CBC WITH DIFFERENTIAL/PLATELET
BASOS ABS: 0 10*3/uL (ref 0–0.1)
BASOS PCT: 0 %
EOS PCT: 0 %
Eosinophils Absolute: 0 10*3/uL (ref 0–0.7)
HCT: 38.5 % (ref 35.0–47.0)
Hemoglobin: 12.5 g/dL (ref 12.0–16.0)
Lymphocytes Relative: 12 %
Lymphs Abs: 0.8 10*3/uL — ABNORMAL LOW (ref 1.0–3.6)
MCH: 27.3 pg (ref 26.0–34.0)
MCHC: 32.5 g/dL (ref 32.0–36.0)
MCV: 84 fL (ref 80.0–100.0)
MONO ABS: 1.1 10*3/uL — AB (ref 0.2–0.9)
Monocytes Relative: 15 %
Neutro Abs: 5.3 10*3/uL (ref 1.4–6.5)
Neutrophils Relative %: 73 %
PLATELETS: 170 10*3/uL (ref 150–440)
RBC: 4.59 MIL/uL (ref 3.80–5.20)
RDW: 16.8 % — AB (ref 11.5–14.5)
WBC: 7.3 10*3/uL (ref 3.6–11.0)

## 2017-06-26 LAB — LACTIC ACID, PLASMA: Lactic Acid, Venous: 1 mmol/L (ref 0.5–1.9)

## 2017-06-26 LAB — TROPONIN I: Troponin I: <0.03 ng/mL (ref <0.03)

## 2017-06-26 LAB — INFLUENZA PANEL BY PCR (TYPE A & B)
INFLAPCR: POSITIVE — AB
Influenza B By PCR: NEGATIVE

## 2017-06-26 MED ORDER — AMLODIPINE BESYLATE 5 MG PO TABS
5.0000 mg | ORAL_TABLET | Freq: Every day | ORAL | Status: DC
  Administered 2017-06-27 – 2017-06-30 (×4): 5 mg via ORAL
  Filled 2017-06-26 (×4): qty 1

## 2017-06-26 MED ORDER — SODIUM CHLORIDE 0.9 % IV SOLN
1.0000 g | Freq: Once | INTRAVENOUS | Status: AC
  Administered 2017-06-26: 1 g via INTRAVENOUS
  Filled 2017-06-26: qty 10

## 2017-06-26 MED ORDER — ACETAMINOPHEN 325 MG PO TABS: 650.0000 mg | ORAL_TABLET | ORAL | Status: DC | PRN

## 2017-06-26 MED ORDER — DIVALPROEX SODIUM 125 MG PO CSDR: 125.0000 mg | DELAYED_RELEASE_CAPSULE | ORAL | Status: DC

## 2017-06-26 MED ORDER — ALBUTEROL SULFATE (2.5 MG/3ML) 0.083% IN NEBU: 2.5000 mg | INHALATION_SOLUTION | RESPIRATORY_TRACT | Status: DC | PRN

## 2017-06-26 MED ORDER — METHYLPREDNISOLONE SODIUM SUCC 125 MG IJ SOLR
60.0000 mg | Freq: Three times a day (TID) | INTRAMUSCULAR | Status: DC
  Administered 2017-06-26 – 2017-06-30 (×12): 60 mg via INTRAVENOUS
  Filled 2017-06-26 (×12): qty 2

## 2017-06-26 MED ORDER — OSELTAMIVIR PHOSPHATE 30 MG PO CAPS
30.0000 mg | ORAL_CAPSULE | Freq: Once | ORAL | Status: AC
  Administered 2017-06-26: 30 mg via ORAL
  Filled 2017-06-26: qty 1

## 2017-06-26 MED ORDER — ENOXAPARIN SODIUM 40 MG/0.4ML ~~LOC~~ SOLN
40.0000 mg | SUBCUTANEOUS | Status: DC
  Administered 2017-06-27 – 2017-06-28 (×3): 40 mg via SUBCUTANEOUS
  Filled 2017-06-26 (×4): qty 0.4

## 2017-06-26 MED ORDER — PAROXETINE HCL 20 MG PO TABS
20.0000 mg | ORAL_TABLET | Freq: Every morning | ORAL | Status: DC
  Administered 2017-06-27 – 2017-06-30 (×4): 20 mg via ORAL
  Filled 2017-06-26 (×4): qty 1

## 2017-06-26 MED ORDER — METHYLPREDNISOLONE SODIUM SUCC 125 MG IJ SOLR
125.0000 mg | Freq: Once | INTRAMUSCULAR | Status: AC
  Administered 2017-06-26: 125 mg via INTRAVENOUS
  Filled 2017-06-26: qty 2

## 2017-06-26 MED ORDER — DOXYCYCLINE HYCLATE 100 MG PO TABS
100.0000 mg | ORAL_TABLET | Freq: Once | ORAL | Status: AC
  Administered 2017-06-26: 100 mg via ORAL
  Filled 2017-06-26: qty 1

## 2017-06-26 MED ORDER — FLUTICASONE PROPIONATE 50 MCG/ACT NA SUSP
2.0000 | Freq: Two times a day (BID) | NASAL | Status: DC
  Administered 2017-06-27 – 2017-06-30 (×8): 2 via NASAL
  Filled 2017-06-26: qty 16

## 2017-06-26 MED ORDER — SODIUM CHLORIDE 0.9 % IV SOLN
1.0000 g | INTRAVENOUS | Status: DC
  Administered 2017-06-26: 1 g via INTRAVENOUS
  Filled 2017-06-26 (×2): qty 10

## 2017-06-26 MED ORDER — LOPERAMIDE HCL 2 MG PO CAPS: 4.0000 mg | ORAL_CAPSULE | ORAL | Status: DC | PRN

## 2017-06-26 MED ORDER — DIVALPROEX SODIUM 125 MG PO CSDR
250.0000 mg | DELAYED_RELEASE_CAPSULE | Freq: Every day | ORAL | Status: DC
  Administered 2017-06-26 – 2017-06-29 (×4): 250 mg via ORAL
  Filled 2017-06-26 (×6): qty 2

## 2017-06-26 MED ORDER — IPRATROPIUM-ALBUTEROL 0.5-2.5 (3) MG/3ML IN SOLN
3.0000 mL | Freq: Once | RESPIRATORY_TRACT | Status: AC
  Administered 2017-06-26: 3 mL via RESPIRATORY_TRACT
  Filled 2017-06-26: qty 3

## 2017-06-26 MED ORDER — SODIUM CHLORIDE 0.9 % IV BOLUS (SEPSIS)
1000.0000 mL | Freq: Once | INTRAVENOUS | Status: AC
  Administered 2017-06-26: 1000 mL via INTRAVENOUS

## 2017-06-26 MED ORDER — IPRATROPIUM-ALBUTEROL 0.5-2.5 (3) MG/3ML IN SOLN
3.0000 mL | Freq: Four times a day (QID) | RESPIRATORY_TRACT | Status: DC
  Administered 2017-06-27 – 2017-06-28 (×7): 3 mL via RESPIRATORY_TRACT
  Filled 2017-06-26 (×7): qty 3

## 2017-06-26 MED ORDER — DOXYCYCLINE HYCLATE 100 MG PO TABS
100.0000 mg | ORAL_TABLET | Freq: Two times a day (BID) | ORAL | Status: DC
  Administered 2017-06-26 – 2017-06-27 (×2): 100 mg via ORAL
  Filled 2017-06-26 (×3): qty 1

## 2017-06-26 MED ORDER — VITAMIN D 1000 UNITS PO TABS
1000.0000 [IU] | ORAL_TABLET | Freq: Every day | ORAL | Status: DC
  Administered 2017-06-27 – 2017-06-30 (×4): 1000 [IU] via ORAL
  Filled 2017-06-26 (×4): qty 1

## 2017-06-26 MED ORDER — DIVALPROEX SODIUM 125 MG PO CSDR
125.0000 mg | DELAYED_RELEASE_CAPSULE | Freq: Every morning | ORAL | Status: DC
Start: 2017-06-27 — End: 2017-07-01
  Administered 2017-06-27 – 2017-06-30 (×4): 125 mg via ORAL
  Filled 2017-06-26 (×4): qty 1

## 2017-06-26 MED ORDER — MAGNESIUM HYDROXIDE 400 MG/5ML PO SUSP: 30.0000 mL | Freq: Every day | ORAL | Status: DC | PRN

## 2017-06-26 MED ORDER — DIPHENHYDRAMINE HCL 25 MG PO CAPS: 25.0000 mg | ORAL_CAPSULE | Freq: Four times a day (QID) | ORAL | Status: DC | PRN

## 2017-06-26 MED ORDER — GUAIFENESIN 100 MG/5ML PO SYRP: 300.0000 mg | ORAL_SOLUTION | Freq: Four times a day (QID) | ORAL | Status: DC | PRN

## 2017-06-26 MED ORDER — PANTOPRAZOLE SODIUM 40 MG PO TBEC
40.0000 mg | DELAYED_RELEASE_TABLET | Freq: Every day | ORAL | Status: DC
  Administered 2017-06-27 – 2017-06-30 (×4): 40 mg via ORAL
  Filled 2017-06-26 (×4): qty 1

## 2017-06-26 MED ORDER — VITAMIN B-12 1000 MCG PO TABS
1000.0000 ug | ORAL_TABLET | Freq: Every day | ORAL | Status: DC
  Administered 2017-06-27 – 2017-06-30 (×4): 1000 ug via ORAL
  Filled 2017-06-26 (×4): qty 1

## 2017-06-26 MED ORDER — METOPROLOL TARTRATE 25 MG PO TABS
12.5000 mg | ORAL_TABLET | Freq: Two times a day (BID) | ORAL | Status: DC
  Administered 2017-06-27 – 2017-06-30 (×7): 12.5 mg via ORAL
  Filled 2017-06-26 (×8): qty 1

## 2017-06-26 MED ORDER — ALUM & MAG HYDROXIDE-SIMETH 200-200-20 MG/5ML PO SUSP: 30.0000 mL | Freq: Four times a day (QID) | ORAL | Status: DC | PRN

## 2017-06-26 NOTE — ED Notes (Signed)
Pt walked to the bathroom, O2 went to 83% on RA. Placed her back on 2L [place]. MD is aware.

## 2017-06-26 NOTE — Progress Notes (Signed)
Family Meeting Note  Advance Directive:yes  Today a meeting took place with the Patient.     The following clinical team members were present during this meeting:MD  The following were discussed:Patient's diagnosis: Community-acquired pneumonia, flu, acute hypoxic respiratory failure, chronic essential hypertension, acute exacerbation of COPD, paroxysmal atrial fibrillation and GERD were discussed in detail with the patient and the treatment plan of care was discussed in detail with the patient,  Patient's progosis: Unable to determine and Goals for treatment: Full Code, patient's niece Eliberto Ivory is her healthcare POA  Additional follow-up to be provided: hospitalist  Time spent during discussion:18 min  Ramonita Lab, MD

## 2017-06-26 NOTE — ED Triage Notes (Signed)
Pt from [facility] via [facility] with complaints of SOB and cough. Pt is febrile and O2 on room air went to 90% with resp in the 30's.

## 2017-06-26 NOTE — ED Provider Notes (Signed)
[facility] Emergency Department Provider Note  ____________________________________________  Time seen: Approximately 3:41 PM  I have reviewed the triage vital signs and the nursing notes.   HISTORY  Chief Complaint Shortness of Breath   HPI Crystal Todd is a 84 y.o. female with a history of COPD, asthma, paroxysmal atrial fibrillation, hypertension, hyperlipidemiawho presents for evaluation of shortness of breath. Patient reports that she was in her usual state of health yesterday evening when she went to bed. This morning she woke up very congested, sneezing, generalized weakness, and progressively worsening shortness of breath with became severe this afternoon. The shortness of breath is constant. She denies coughing, nausea, vomiting, chest pain. She has had a few episodes of watery diarrhea. She has had chills but no fever. Per EMS patient was satting 85% on room air and breathing 30-35 times per minute.  Past Medical History:  Diagnosis Date  . Arthritis    hands, knees  . Asthma   . COPD (chronic obstructive pulmonary disease) (HCC)   . Dysrhythmia    A-Fib  . Full dentures    upper and lower  . GERD (gastroesophageal reflux disease)   . Hypercholesteremia   . Hypertension   . Osteoporosis   . Paroxysmal atrial fibrillation (HCC)   . Shortness of breath dyspnea     There are no active problems to display for this patient.   Past Surgical History:  Procedure Laterality Date  . BACK SURGERY     "cemented" discs  . CATARACT EXTRACTION W/PHACO Left 10/12/2014   Procedure: CATARACT EXTRACTION PHACO AND INTRAOCULAR LENS PLACEMENT (IOC);  Surgeon: Lockie Mola, MD;  Location: [facility];  Service: Ophthalmology;  Laterality: Left;  . ESOPHAGOGASTRODUODENOSCOPY (EGD) WITH ESOPHAGEAL DILATION    . JOINT REPLACEMENT     hip  . WRIST FRACTURE SURGERY Bilateral     Prior to Admission medications   Medication Sig Start Date  End Date Taking? Authorizing Provider  acetaminophen (TYLENOL) 325 MG tablet Take 650 mg by mouth every 4 (four) hours as needed for fever (up to 101).   Yes [provider]  alum & mag hydroxide-simeth (ANTACID) 200-200-20 MG/5ML suspension Take 30 mLs by mouth every 6 (six) hours as needed for indigestion or heartburn.   Yes [provider]  amLODipine (NORVASC) 5 MG tablet Take 5 mg by mouth daily.   Yes [provider]  cholecalciferol (VITAMIN D) 1000 units tablet Take 1,000 Units by mouth daily.   Yes [provider]  diphenhydrAMINE (BENADRYL) 25 MG tablet Take 25 mg by mouth every 6 (six) hours as needed for allergies.   Yes [provider]  divalproex (DEPAKOTE SPRINKLE) 125 MG capsule Take 125-250 mg by mouth See admin instructions. Take 1 capsule by mouth every morning, and take 2 capsules (250 mg) by mouth every night at bedtime.   Yes [provider]  fluticasone (FLONASE) 50 MCG/ACT nasal spray Place 2 sprays into the nose 2 (two) times daily.   Yes [provider]  guaiFENesin (ROBITUSSIN) 100 MG/5ML liquid Take 300 mg by mouth every 6 (six) hours as needed for cough.   Yes [provider]  loperamide (IMODIUM A-D) 2 MG capsule Take 4 mg by mouth as needed for diarrhea or loose stools.   Yes [provider]  magnesium hydroxide (MILK OF MAGNESIA) 400 MG/5ML suspension Take 30 mLs by mouth daily as needed for mild constipation.   Yes [provider]  metoprolol tartrate (  LOPRESSOR) 25 MG tablet Take 12.5 mg by mouth 2 (two) times daily.   Yes [provider]  multivitamin (PROSIGHT) TABS tablet Take 1 tablet by mouth daily.   Yes [provider]  omeprazole (PRILOSEC) 20 MG capsule Take 20 mg by mouth daily.   Yes [provider]  PARoxetine (PAXIL) 20 MG tablet Take 20 mg by mouth every morning.   Yes [provider]  vitamin B-12 (CYANOCOBALAMIN) 1000 MCG tablet  Take 1,000 mcg by mouth daily.   Yes [provider]    Allergies Ace inhibitors; Alendronate; and Influenza vaccines  History reviewed. No pertinent family history.  Social History Social History   Tobacco Use  . Smoking status: Former Smoker    Last attempt to quit: 04/08/1974    Years since quitting: 43.2  . Smokeless tobacco: Never Used  Substance Use Topics  . Alcohol use: No  . Drug use: Not on file    Review of Systems  Constitutional: Negative for fever. + chills Eyes: Negative for visual changes. ENT: Negative for sore throat. + congestion Neck: No neck pain  Cardiovascular: Negative for chest pain. Respiratory: + shortness of breath. Gastrointestinal: Negative for abdominal pain, vomiting. + diarrhea. Genitourinary: Negative for dysuria. Musculoskeletal: Negative for back pain. Skin: Negative for rash. Neurological: Negative for headaches, weakness or numbness. Psych: No SI or HI  ____________________________________________   PHYSICAL EXAM:  VITAL SIGNS: ED Triage Vitals  Enc Vitals Group     BP      Pulse      Resp      Temp      Temp src      SpO2      Weight      Height      Head Circumference      Peak Flow      Pain Score      Pain Loc      Pain Edu?      Excl. in GC?     Constitutional: Alert and oriented. Well appearing and in no apparent distress. HEENT:      Head: Normocephalic and atraumatic.         Eyes: Conjunctivae are normal. Sclera is non-icteric.       Mouth/Throat: Mucous membranes are moist.       Neck: Supple with no signs of meningismus. Cardiovascular: Regular rate and rhythm. No murmurs, gallops, or rubs. 2+ symmetrical distal pulses are present in all extremities. No JVD. Respiratory: increased work of breathing, RR 30, sats 90% on RA, decreased air movement bilaterally with crackles on the left base Gastrointestinal: Soft, non tender, and non distended with positive bowel sounds. No rebound or  guarding. Genitourinary: No CVA tenderness. Musculoskeletal: Nontender with normal range of motion in all extremities. No edema, cyanosis, or erythema of extremities. Neurologic: Normal speech and language. Face is symmetric. Moving all extremities. No gross focal neurologic deficits are appreciated. Skin: Skin is warm, dry and intact. No rash noted. Psychiatric: Mood and affect are normal. Speech and behavior are normal.  ____________________________________________   LABS (all labs ordered are listed, but only abnormal results are displayed)  Labs Reviewed  CBC WITH DIFFERENTIAL/PLATELET - Abnormal; Notable for the following components:      Result Value   RDW 16.8 (*)    Lymphs Abs 0.8 (*)    Monocytes Absolute 1.1 (*)    All other components within normal limits  BASIC METABOLIC PANEL - Abnormal; Notable for the following components:  Chloride 98 (*)    Calcium 8.6 (*)    All other components within normal limits  BLOOD GAS, VENOUS - Abnormal; Notable for the following components:   Bicarbonate 29.8 (*)    Acid-Base Excess 4.5 (*)    All other components within normal limits  INFLUENZA PANEL BY PCR (TYPE A & B) - Abnormal; Notable for the following components:   Influenza A By PCR POSITIVE (*)    All other components within normal limits  CULTURE, BLOOD (ROUTINE X 2)  CULTURE, BLOOD (ROUTINE X 2)  LACTIC ACID, PLASMA  TROPONIN I   ____________________________________________  EKG  ED ECG REPORT I, Nita Sickle, the attending physician, personally viewed and interpreted this ECG.  Normal sinus rhythm, rate of 74, normal intervals, normal axis, T wave inversions in inferior and lateral leads, no ST elevation.  These inversions are new when compared to prior ____________________________________________  RADIOLOGY  I have personally reviewed the images performed during this visit and I agree with the Radiologist's read.   Interpretation by Radiologist:  Dg  Chest Portable 1 View  Result Date: 06/26/2017 CLINICAL DATA:  Shortness of breath and cough today, history asthma, COPD, hypertension, former smoker EXAM: PORTABLE CHEST 1 VIEW COMPARISON:  06/18/2011 FINDINGS: Enlargement of cardiac silhouette. Atherosclerotic calcification aorta. Kyphotic positioning. Mediastinal contours and pulmonary vascularity normal. Peribronchial thickening with atelectasis versus infiltrate at LEFT lower lobe. No additional acute infiltrate, pleural effusion or pneumothorax. Diffuse osseous demineralization IMPRESSION: Enlargement of cardiac silhouette. Bronchitic changes with atelectasis versus consolidation LEFT lower lobe. Electronically Signed   By: Ulyses Southward M.D.   On: 06/26/2017 16:22    ____________________________________________   PROCEDURES  Procedure(s) performed: None Procedures Critical Care performed: yes  CRITICAL CARE Performed by: Nita Sickle  ?  Total critical care time: 40 min  Critical care time was exclusive of separately billable procedures and treating other patients.  Critical care was necessary to treat or prevent imminent or life-threatening deterioration.  Critical care was time spent personally by me on the following activities: development of treatment plan with patient and/or surrogate as well as nursing, discussions with consultants, evaluation of patient's response to treatment, examination of patient, obtaining history from patient or surrogate, ordering and performing treatments and interventions, ordering and review of laboratory studies, ordering and review of radiographic studies, pulse oximetry and re-evaluation of patient's condition.  ____________________________________________   INITIAL IMPRESSION / ASSESSMENT AND PLAN / ED COURSE   84 y.o. female with a history of COPD, asthma, paroxysmal atrial fibrillation, hypertension, hyperlipidemiawho presents for evaluation of shortness of breath, congestion,  weakness, chills, diarrhea since this am. patient arrives with increased work of breathing, satting 90% on room air, respiratory rate in the low 30s, temperature of 99.80F, normal heart rate and blood pressure, decreased air movement bilaterally with crackles on the left base. will treat patient with duoneb 2, solumedrol, IVF. Will check Flu, CXR to rule out PNA, labs to eval for sepsis.   Clinical Course as of Jun 27 1811  Thu Jun 26, 2017  1700 chest x-ray consistent with pneumonia. Normal white count, normal lactate. Patient no longer requiring oxygen after 2 DuoNeb. We'll give Rocephin and doxycycline, IV fluids, and continue to monitor. Patient remains with no new oxygen requirement she is going to be discharged home on oral antibiotics. No signs of sepsis at this time.   [CV]    Clinical Course User Index [CV] Nita Sickle, MD   _________________________ 6:14 PM on 06/26/2017 -----------------------------------------  Patient is also influenza A positive, started on Tamiflu.  Continues to have oxygen requirement with minimal ambulation.  Will admit to the hospitalist service for acute hypoxic respiratory failure, influenza, and community-acquired pneumonia.  As part of my medical decision making, I reviewed the following data within the electronic MEDICAL RECORD NUMBER Nursing notes reviewed and incorporated, Labs reviewed , EKG interpreted , Old EKG reviewed, Old chart reviewed, Radiograph reviewed , Discussed with admitting physician , Notes from prior ED visits and [place] Controlled Substance Database    Pertinent labs & imaging results that were available during my care of the patient were reviewed by me and considered in my medical decision making (see chart for details).    ____________________________________________   FINAL CLINICAL IMPRESSION(S) / ED DIAGNOSES  Final diagnoses:  COPD exacerbation (HCC)  Community acquired pneumonia of left lower lobe of lung (HCC)   Influenza A  Acute respiratory failure with hypoxia (HCC)      NEW MEDICATIONS STARTED DURING THIS VISIT:  ED Discharge Orders    None       Note:  This document was prepared using Dragon voice recognition software and may include unintentional dictation errors.    Don Perking, [place], MD 06/26/17 [phone]

## 2017-06-26 NOTE — H&P (Signed)
[facility] [place] at [facility]   PATIENT NAME: Crystal Todd    MR#:  [id number]  DATE OF BIRTH:  November 01, 1927  DATE OF ADMISSION:  06/26/2017  PRIMARY CARE PHYSICIAN: Patient, No Pcp Per   REQUESTING/REFERRING PHYSICIAN: Nita Sickle, MD  CHIEF COMPLAINT:  Shortness of breath and cough  HISTORY OF PRESENT ILLNESS:  Crystal Todd  is a 84 y.o. female with a known history of COPD, essential hypertension, prior paroxysmal atrial fibrillation, hyperlipidemia, is brought into the ED from independent living facility for shortness of breath associated with cough which has been progressively getting worse.  Today she woke up with congestion sneezing and generalized weakness.  Patient denies any abdominal pain nausea vomiting or chest pain.  Patient reports few episodes of watery diarrhea which is improved now.  Denies any fever.  Patient is hypoxemic saturating 85% on room air requiring 2 L of oxygen.  Chest x-ray with the consolidation and a flu test is positive for influenza A.  Hospitalist team is called to admit the patient  PAST MEDICAL HISTORY:   Past Medical History:  Diagnosis Date  . Arthritis    hands, knees  . Asthma   . COPD (chronic obstructive pulmonary disease) (HCC)   . Dysrhythmia    A-Fib  . Full dentures    upper and lower  . GERD (gastroesophageal reflux disease)   . Hypercholesteremia   . Hypertension   . Osteoporosis   . Paroxysmal atrial fibrillation (HCC)   . Shortness of breath dyspnea     PAST SURGICAL HISTOIRY:   Past Surgical History:  Procedure Laterality Date  . BACK SURGERY     "cemented" discs  . CATARACT EXTRACTION W/PHACO Left 10/12/2014   Procedure: CATARACT EXTRACTION PHACO AND INTRAOCULAR LENS PLACEMENT (IOC);  Surgeon: Lockie Mola, MD;  Location: [facility];  Service: Ophthalmology;  Laterality: Left;  . ESOPHAGOGASTRODUODENOSCOPY (EGD) WITH ESOPHAGEAL DILATION    . JOINT REPLACEMENT      hip  . WRIST FRACTURE SURGERY Bilateral     SOCIAL HISTORY:   Social History   Tobacco Use  . Smoking status: Former Smoker    Last attempt to quit: 04/08/1974    Years since quitting: 43.2  . Smokeless tobacco: Never Used  Substance Use Topics  . Alcohol use: No    FAMILY HISTORY:  History reviewed. No pertinent family history.  DRUG ALLERGIES:   Allergies  Allergen Reactions  . Ace Inhibitors     Other reaction(s): Other (See Comments) Other Reaction: Other reaction  . Alendronate     Other reaction(s): Other (See Comments) Other Reaction: Other reaction  . Influenza Vaccines Other (See Comments)    "kept the flu for a year"    REVIEW OF SYSTEMS:  CONSTITUTIONAL: No fever, fatigue or weakness.  EYES: No blurred or double vision.  EARS, NOSE, AND THROAT: No tinnitus or ear pain.  RESPIRATORY:  reporting cough, shortness of breath with minimal exertion, denies wheezing or hemoptysis.  CARDIOVASCULAR: No chest pain, orthopnea, edema.  GASTROINTESTINAL: No nausea, vomiting, diarrhea or abdominal pain.  GENITOURINARY: No dysuria, hematuria.  ENDOCRINE: No polyuria, nocturia,  HEMATOLOGY: No anemia, easy bruising or bleeding SKIN: No rash or lesion. MUSCULOSKELETAL: No joint pain or arthritis.   NEUROLOGIC: No tingling, numbness, weakness.  PSYCHIATRY: No anxiety or depression.   MEDICATIONS AT HOME:   Prior to Admission medications   Medication Sig Start Date End Date Taking? Authorizing Provider  acetaminophen (TYLENOL) 325 MG tablet  Take 650 mg by mouth every 4 (four) hours as needed for fever (up to 101).   Yes [provider]  alum & mag hydroxide-simeth (ANTACID) 200-200-20 MG/5ML suspension Take 30 mLs by mouth every 6 (six) hours as needed for indigestion or heartburn.   Yes [provider]  amLODipine (NORVASC) 5 MG tablet Take 5 mg by mouth daily.   Yes [provider]  cholecalciferol (VITAMIN D) 1000 units tablet Take 1,000  Units by mouth daily.   Yes [provider]  diphenhydrAMINE (BENADRYL) 25 MG tablet Take 25 mg by mouth every 6 (six) hours as needed for allergies.   Yes [provider]  divalproex (DEPAKOTE SPRINKLE) 125 MG capsule Take 125-250 mg by mouth See admin instructions. Take 1 capsule by mouth every morning, and take 2 capsules (250 mg) by mouth every night at bedtime.   Yes [provider]  fluticasone (FLONASE) 50 MCG/ACT nasal spray Place 2 sprays into the nose 2 (two) times daily.   Yes [provider]  guaiFENesin (ROBITUSSIN) 100 MG/5ML liquid Take 300 mg by mouth every 6 (six) hours as needed for cough.   Yes [provider]  loperamide (IMODIUM A-D) 2 MG capsule Take 4 mg by mouth as needed for diarrhea or loose stools.   Yes [provider]  magnesium hydroxide (MILK OF MAGNESIA) 400 MG/5ML suspension Take 30 mLs by mouth daily as needed for mild constipation.   Yes [provider]  metoprolol tartrate (LOPRESSOR) 25 MG tablet Take 12.5 mg by mouth 2 (two) times daily.   Yes [provider]  multivitamin (PROSIGHT) TABS tablet Take 1 tablet by mouth daily.   Yes [provider]  omeprazole (PRILOSEC) 20 MG capsule Take 20 mg by mouth daily.   Yes [provider]  PARoxetine (PAXIL) 20 MG tablet Take 20 mg by mouth every morning.   Yes [provider]  vitamin B-12 (CYANOCOBALAMIN) 1000 MCG tablet Take 1,000 mcg by mouth daily.   Yes [provider]      VITAL SIGNS:  Blood pressure (!) 143/54, pulse 93, temperature 99.1 F (37.3 C), temperature source Oral, resp. rate (!) 24, height 5\' 4"  (1.626 m), weight 49.9 kg (110 lb), SpO2 93 %.  PHYSICAL EXAMINATION:  GENERAL:  84 y.o.-year-old patient lying in the bed with no acute distress.  EYES: Pupils equal, round, reactive to light and accommodation. No scleral icterus. Extraocular muscles intact.  HEENT: Head atraumatic, normocephalic.  Oropharynx and nasopharyn congested NECK:  Supple, no jugular venous distention. No thyroid enlargement, no tenderness.  LUNGS: Mod bronchial breath sounds bilaterally, no wheezing, rales,rhonchi ; right-sided crepitation. No use of accessory muscles of respiration.  CARDIOVASCULAR: S1, S2 normal. No murmurs, rubs, or gallops.  ABDOMEN: Soft, nontender, nondistended. Bowel sounds present.  EXTREMITIES: No pedal edema, cyanosis, or clubbing.  NEUROLOGIC: Cranial nerves II through XII are intact. Muscle strength generalized weakness in all extremities. Sensation intact. Gait not checked.  PSYCHIATRIC: The patient is alert and oriented x 3.  SKIN: No obvious rash, lesion, or ulcer.   LABORATORY PANEL:   CBC Recent Labs  Lab 06/26/17 1546  WBC 7.3  HGB 12.5  HCT 38.5  PLT 170   ------------------------------------------------------------------------------------------------------------------  Chemistries  Recent Labs  Lab 06/26/17 1546  NA 135  K 4.2  CL 98*  CO2 26  GLUCOSE 98  BUN 10  CREATININE 0.66  CALCIUM 8.6*   ------------------------------------------------------------------------------------------------------------------  Cardiac Enzymes Recent Labs  Lab 06/26/17  1546  TROPONINI <0.03   ------------------------------------------------------------------------------------------------------------------  RADIOLOGY:  Dg Chest Portable 1 View  Result Date: 06/26/2017 CLINICAL DATA:  Shortness of breath and cough today, history asthma, COPD, hypertension, former smoker EXAM: PORTABLE CHEST 1 VIEW COMPARISON:  06/18/2011 FINDINGS: Enlargement of cardiac silhouette. Atherosclerotic calcification aorta. Kyphotic positioning. Mediastinal contours and pulmonary vascularity normal. Peribronchial thickening with atelectasis versus infiltrate at LEFT lower lobe. No additional acute infiltrate, pleural effusion or pneumothorax. Diffuse osseous demineralization IMPRESSION:  Enlargement of cardiac silhouette. Bronchitic changes with atelectasis versus consolidation LEFT lower lobe. Electronically Signed   By: Ulyses Southward M.D.   On: 06/26/2017 16:22    EKG:   Orders placed or performed during the hospital encounter of 06/26/17  . ED EKG  . ED EKG  . EKG 12-Lead  . EKG 12-Lead    IMPRESSION AND PLAN:   Yaret Hush  is a 84 y.o. female with a known history of COPD, essential hypertension, prior paroxysmal atrial fibrillation, hyperlipidemia, is brought into the ED from independent living facility for shortness of breath associated with cough which has been progressively getting worse.  Today she woke up with congestion sneezing and generalized weakness.    #Acute hypoxic respiratory failure secondary to pneumonia, flu a, COPD exacerbation  admit patient to MedSurg unit Oxygen via nasal cannula Check pulse ox with vital signs Bronchodilator treatment Antibiotics, Tamiflu and steroids  #Community-acquired pneumonia Check sputum culture and sensitivity Check respiratory panel Patient is started on Rocephin and doxycycline Continue oxygen and wean off as tolerated Bronchodilator treatment  #Influenza A Tamiflu and supportive treatment  #Acute COPD exacerbation  bronco dilators, steroids and oxygen  #Essential hypertension continue home medication Norvasc and metoprolol  #Paroxysmal atrial fibrillation rate controlled Continue Toprol for rate control and aspirin  GI reflexes with Protonix and DVT prophylaxis with Lovenox subcu   All the records are reviewed and case discussed with ED provider. Management plans discussed with the patient, family and they are in agreement.  CODE STATUS: fc   TOTAL TIME TAKING CARE OF THIS PATIENT: 45 minutes.   Note: This dictation was prepared with Dragon dictation along with smaller phrase technology. Any transcriptional errors that result from this process are unintentional.  Ramonita Lab M.D on 06/26/2017  at 7:00 PM  Between 7am to 6pm - Pager - [phone]  After 6pm go to [email or web address] - password [email or web address]  [facility]  Office  [phone]  CC: Primary care physician; Patient, No Pcp Per

## 2017-06-27 ENCOUNTER — Other Ambulatory Visit: Payer: Self-pay

## 2017-06-27 LAB — BASIC METABOLIC PANEL
ANION GAP: 11 (ref 5–15)
BUN: 12 mg/dL (ref 6–20)
CHLORIDE: 107 mmol/L (ref 101–111)
CO2: 22 mmol/L (ref 22–32)
CREATININE: 0.62 mg/dL (ref 0.44–1.00)
Calcium: 8.2 mg/dL — ABNORMAL LOW (ref 8.9–10.3)
GFR calc non Af Amer: >60 mL/min (ref >60)
Glucose, Bld: 143 mg/dL — ABNORMAL HIGH (ref 65–99)
POTASSIUM: 4.4 mmol/L (ref 3.5–5.1)
SODIUM: 140 mmol/L (ref 135–145)

## 2017-06-27 LAB — CBC
HEMATOCRIT: 35.8 % (ref 35.0–47.0)
HEMOGLOBIN: 11.5 g/dL — AB (ref 12.0–16.0)
MCH: 27.6 pg (ref 26.0–34.0)
MCHC: 32.2 g/dL (ref 32.0–36.0)
MCV: 85.6 fL (ref 80.0–100.0)
Platelets: 136 10*3/uL — ABNORMAL LOW (ref 150–440)
RBC: 4.18 MIL/uL (ref 3.80–5.20)
RDW: 16.7 % — ABNORMAL HIGH (ref 11.5–14.5)
WBC: 5 10*3/uL (ref 3.6–11.0)

## 2017-06-27 LAB — PROCALCITONIN

## 2017-06-27 LAB — STREP PNEUMONIAE URINARY ANTIGEN: STREP PNEUMO URINARY ANTIGEN: NEGATIVE

## 2017-06-27 MED ORDER — OSELTAMIVIR PHOSPHATE 30 MG PO CAPS
30.0000 mg | ORAL_CAPSULE | Freq: Two times a day (BID) | ORAL | Status: DC
  Administered 2017-06-27 – 2017-06-30 (×7): 30 mg via ORAL
  Filled 2017-06-27 (×8): qty 1

## 2017-06-27 MED ORDER — ORAL CARE MOUTH RINSE
15.0000 mL | Freq: Two times a day (BID) | OROMUCOSAL | Status: DC
  Administered 2017-06-27 – 2017-06-30 (×2): 15 mL via OROMUCOSAL

## 2017-06-27 NOTE — Progress Notes (Signed)
[employer] [facility] - [place] at [facility]   PATIENT NAME: Crystal Todd    MR#:  [id number]  DATE OF BIRTH:  01/28/1928  SUBJECTIVE:  CHIEF COMPLAINT:   Chief Complaint  Patient presents with  . Shortness of Breath   Came with weakness and cough, noted to have influenza. Feels much better now.  REVIEW OF SYSTEMS:  CONSTITUTIONAL: No fever,have fatigue or weakness.  EYES: No blurred or double vision.  EARS, NOSE, AND THROAT: No tinnitus or ear pain.  RESPIRATORY: have cough, shortness of breath,no wheezing or hemoptysis.  CARDIOVASCULAR: No chest pain, orthopnea, edema.  GASTROINTESTINAL: No nausea, vomiting, diarrhea or abdominal pain.  GENITOURINARY: No dysuria, hematuria.  ENDOCRINE: No polyuria, nocturia,  HEMATOLOGY: No anemia, easy bruising or bleeding SKIN: No rash or lesion. MUSCULOSKELETAL: No joint pain or arthritis.   NEUROLOGIC: No tingling, numbness, weakness.  PSYCHIATRY: No anxiety or depression.   ROS  DRUG ALLERGIES:   Allergies  Allergen Reactions  . Ace Inhibitors     Other reaction(s): Other (See Comments) Other Reaction: Other reaction  . Alendronate     Other reaction(s): Other (See Comments) Other Reaction: Other reaction  . Influenza Vaccines Other (See Comments)    "kept the flu for a year"    VITALS:  Blood pressure (!) 122/44, pulse 73, temperature 97.8 F (36.6 C), temperature source Oral, resp. rate 18, height 5\' 4"  (1.626 m), weight 49.7 kg (109 lb 8 oz), SpO2 96 %.  PHYSICAL EXAMINATION:  GENERAL:  84 y.o.-year-old patient lying in the bed with no acute distress.  EYES: Pupils equal, round, reactive to light and accommodation. No scleral icterus. Extraocular muscles intact.  HEENT: Head atraumatic, normocephalic. Oropharynx and nasopharynx clear.  NECK:  Supple, no jugular venous distention. No thyroid enlargement, no tenderness.  LUNGS: Normal breath sounds bilaterally, no wheezing, some crepitation. No use of  accessory muscles of respiration.  CARDIOVASCULAR: S1, S2 normal. No murmurs, rubs, or gallops.  ABDOMEN: Soft, nontender, nondistended. Bowel sounds present. No organomegaly or mass.  EXTREMITIES: No pedal edema, cyanosis, or clubbing.  NEUROLOGIC: Cranial nerves II through XII are intact. Muscle strength 4-5/5 in all extremities. Sensation intact. Gait not checked.  PSYCHIATRIC: The patient is alert and oriented x 3.  SKIN: No obvious rash, lesion, or ulcer.   Physical Exam LABORATORY PANEL:   CBC Recent Labs  Lab 06/27/17 0613  WBC 5.0  HGB 11.5*  HCT 35.8  PLT 136*   ------------------------------------------------------------------------------------------------------------------  Chemistries  Recent Labs  Lab 06/27/17 0613  NA 140  K 4.4  CL 107  CO2 22  GLUCOSE 143*  BUN 12  CREATININE 0.62  CALCIUM 8.2*   ------------------------------------------------------------------------------------------------------------------  Cardiac Enzymes Recent Labs  Lab 06/26/17 1546  TROPONINI <0.03   ------------------------------------------------------------------------------------------------------------------  RADIOLOGY:  Dg Chest Portable 1 View  Result Date: 06/26/2017 CLINICAL DATA:  Shortness of breath and cough today, history asthma, COPD, hypertension, former smoker EXAM: PORTABLE CHEST 1 VIEW COMPARISON:  06/18/2011 FINDINGS: Enlargement of cardiac silhouette. Atherosclerotic calcification aorta. Kyphotic positioning. Mediastinal contours and pulmonary vascularity normal. Peribronchial thickening with atelectasis versus infiltrate at LEFT lower lobe. No additional acute infiltrate, pleural effusion or pneumothorax. Diffuse osseous demineralization IMPRESSION: Enlargement of cardiac silhouette. Bronchitic changes with atelectasis versus consolidation LEFT lower lobe. Electronically Signed   By: 08/18/2011 M.D.   On: 06/26/2017 16:22    ASSESSMENT AND PLAN:    Active Problems:   PNA (pneumonia)  Crystal Todd  is a 84 y.o. female with  a known history of COPD, essential hypertension, prior paroxysmal atrial fibrillation, hyperlipidemia, is brought into the ED from independent living facility for shortness of breath associated with cough which has been progressively getting worse.    #Acute hypoxic respiratory failure secondary to pneumonia, flu a, COPD exacerbation   Oxygen via nasal cannula Check pulse ox with vital signs Bronchodilator treatment Antibiotics, Tamiflu and steroids  #Community-acquired pneumonia- suspected but ruled out. Check sputum culture and sensitivity respiratory panel Patient is started on Rocephin and doxycycline Continue oxygen and wean off as tolerated Bronchodilator treatment   Abx stopped as procalcitonin is < 0.1  #Influenza A Tamiflu and supportive treatment  #Acute COPD exacerbation  bronco dilators, steroids and oxygen  #Essential hypertension continue home medication Norvasc and metoprolol  #Paroxysmal atrial fibrillation rate controlled Continue Toprol for rate control and aspirin  PT eval to help d/c plans.      All the records are reviewed and case discussed with Care Management/Social Workerr. Management plans discussed with the patient, family and they are in agreement.  CODE STATUS: Full.  TOTAL TIME TAKING CARE OF THIS PATIENT: 35 minutes.     POSSIBLE D/C IN 1-2 DAYS, DEPENDING ON CLINICAL CONDITION.   Altamese Dilling M.D on 06/27/2017   Between 7am to 6pm - Pager - [phone]  After 6pm go to [email or web address] - password [email or web address]  [facility]  Office  [phone]  CC: Primary care physician; Patient, No Pcp Per  Note: This dictation was prepared with Dragon dictation along with smaller phrase technology. Any transcriptional errors that result from this process are unintentional.

## 2017-06-27 NOTE — Clinical Social Work Note (Signed)
Clinical Social Work Assessment  Patient Details  Name: Crystal Todd MRN: [id number] Date of Birth: 1927-07-04  Date of referral:  06/27/17               Reason for consult:  Facility Placement                Permission sought to share information with:  Facility Medical sales representative, Family Supports Permission granted to share information::  Yes, Verbal Permission Granted  Name::     Saul,Brenda Niece [phone]  [phone]   Agency::  [facility]  Relationship::     Contact Information:     Housing/Transportation Living arrangements for the past 2 months:  Assisted Living Facility Source of Information:  Patient Patient Interpreter Needed:  None Criminal Activity/Legal Involvement Pertinent to Current Situation/Hospitalization:  No - Comment as needed Significant Relationships:  Other Family Members Lives with:  Facility Resident Do you feel safe going back to the place where you live?  Yes Need for family participation in patient care:  No (Coment)  Care giving concerns:  Patient did not express any concerns about returning back to [facility].   Social Worker assessment / plan:  Patient is a 84 year old female who is a resident of [facility].  Patient has been there for a couple of years.  Patient was explained role of CSW and process for having her return back to ALF.  Patient expressed that she is happy with the way things are at [facility], patient also stated that she has several friends at the ALF that she talks to.  Patient did not have any other questions or concerns.  Patient gave CSW permission to speak with [facility].  Employment status:  Retired Database administrator PT Recommendations:  Not assessed at this time Information / Referral to community resources:     Patient/Family's Response to care:  Patient is looking forward to returning back to ALF once she is ready for discharge.  Patient/Family's Understanding of and Emotional  Response to Diagnosis, Current Treatment, and Prognosis:  Patient expressed that she is hopeful that she will not be at hospital for very long.  Emotional Assessment Appearance:  Appears stated age Attitude/Demeanor/Rapport:    Affect (typically observed):  Calm, Stable, Appropriate Orientation:  Oriented to Self, Oriented to Place, Oriented to  Time, Oriented to Situation Alcohol / Substance use:  Not Applicable Psych involvement (Current and /or in the community):  No (Comment)  Discharge Needs  Concerns to be addressed:  Care Coordination Readmission within the last 30 days:  No Current discharge risk:  None Barriers to Discharge:  Continued Medical Work up   [place] Constable 06/27/2017, 7:25 PM

## 2017-06-27 NOTE — Evaluation (Signed)
Physical Therapy Evaluation Patient Details Name: Crystal Todd MRN: [id number] DOB: Oct 06, 1927 Today's Date: 06/27/2017   History of Present Illness  presented to ER secondary to SOB, cough with hypoxia; admitted acute hypoxic respiratory failure secondary to CAP, influenza A and COPD exacerbation.  Currently requiring 2L supplemental O2.  Clinical Impression  Upon evaluation, patient alert and oriented; eager for OOB activities with therapist.  Bilat UE/LE strength and ROM grossly symmetrical and WFL; significant thoracic kyphosis evident.  Able to complete bed mobility mod indep; sit/stand, basic transfers and gait (60') with RW, cga/min assist.  Decreased balance reactions in A/P plane; min assist to prevent posterior LOB at times.  Min cuing for activity pacing and energy conservation due to mild SOB with activity. Would benefit from skilled PT to address above deficits and promote optimal return to PLOF; Recommend transition to HHPT upon discharge from acute hospitalization.  SaO2 on room air at rest = 86% SaO2 on O2 at rest = 95%      Follow Up Recommendations Home health PT    Equipment Recommendations  Rolling walker with 5" wheels    Recommendations for Other Services       Precautions / Restrictions Precautions Precautions: Fall Precaution Comments: droplet iso Restrictions Weight Bearing Restrictions: No      Mobility  Bed Mobility Overal bed mobility: Modified Independent                Transfers Overall transfer level: Needs assistance Equipment used: Rolling walker (2 wheeled) Transfers: Sit to/from Stand Sit to Stand: Min guard;Min assist         General transfer comment: cuing for hand placement  Ambulation/Gait Ambulation/Gait assistance: Min guard;Min assist Ambulation Distance (Feet): 60 Feet Assistive device: Rolling walker (2 wheeled)       General Gait Details: forward flexed, kyphotic posture; reciprocal stepping pattern with  fair step height/length.  Decreased balance reactions in A/P plans, excessive posterior weight shift noted.  Stairs            Wheelchair Mobility    Modified Rankin (Stroke Patients Only)       Balance Overall balance assessment: Needs assistance Sitting-balance support: No upper extremity supported;Feet supported Sitting balance-Leahy Scale: Good     Standing balance support: Bilateral upper extremity supported Standing balance-Leahy Scale: Fair                               Pertinent Vitals/Pain Pain Assessment: No/denies pain    Home Living Family/patient expects to be discharged to:: Assisted living               Home Equipment: Dan Humphreys - 4 wheels Additional Comments: Resident of the [facility] facility; single-level with no stairs required.    Prior Function Level of Independence: Independent with assistive device(s)         Comments: Mod indep with 4WRW for ADLs, household mobilization; ambulates to/from dining hall for meals.  Denies recent fall history.  No home O2.     Hand Dominance        Extremity/Trunk Assessment   Upper Extremity Assessment Upper Extremity Assessment: Overall WFL for tasks assessed    Lower Extremity Assessment Lower Extremity Assessment: Overall WFL for tasks assessed(grossly at least 4/5 throughout)    Cervical / Trunk Assessment Cervical / Trunk Assessment: Kyphotic  Communication   Communication: No difficulties  Cognition Arousal/Alertness: Awake/alert Behavior During Therapy: WFL for tasks assessed/performed  Overall Cognitive Status: Within Functional Limits for tasks assessed                                        General Comments      Exercises     Assessment/Plan    PT Assessment Patient needs continued PT services  PT Problem List Decreased strength;Decreased activity tolerance;Decreased balance;Decreased mobility;Decreased coordination;Decreased  knowledge of use of DME;Decreased knowledge of precautions;Decreased safety awareness;Cardiopulmonary status limiting activity       PT Treatment Interventions DME instruction;Gait training;Functional mobility training;Therapeutic activities;Therapeutic exercise;Balance training;Patient/family education    PT Goals (Current goals can be found in the Care Plan section)  Acute Rehab PT Goals Patient Stated Goal: to get out of the bed PT Goal Formulation: With patient Time For Goal Achievement: 07/11/17 Potential to Achieve Goals: Good    Frequency Min 2X/week   Barriers to discharge        Co-evaluation               AM-PAC PT "6 Clicks" Daily Activity  Outcome Measure Difficulty turning over in bed (including adjusting bedclothes, sheets and blankets)?: None Difficulty moving from lying on back to sitting on the side of the bed? : None Difficulty sitting down on and standing up from a chair with arms (e.g., wheelchair, bedside commode, etc,.)?: None Help needed moving to and from a bed to chair (including a wheelchair)?: A Little Help needed walking in hospital room?: A Little Help needed climbing 3-5 steps with a railing? : A Little 6 Click Score: 21    End of Session Equipment Utilized During Treatment: Gait belt;Oxygen Activity Tolerance: Patient tolerated treatment well Patient left: in chair;with call bell/phone within reach;with chair alarm set Nurse Communication: Mobility status PT Visit Diagnosis: Difficulty in walking, not elsewhere classified (R26.2);Muscle weakness (generalized) (M62.81)    Time: 5732-2025 PT Time Calculation (min) (ACUTE ONLY): 19 min   Charges:   PT Evaluation $PT Eval Moderate Complexity: 1 Mod PT Treatments $Therapeutic Activity: 8-22 mins   PT G Codes:        Nomi Rudnicki H. Manson Passey, PT, DPT, NCS 06/27/17, 10:19 PM [phone]

## 2017-06-27 NOTE — Care Management (Signed)
Admitted from [facility]. Admitted with exacerbation of copd and pneumonia.  Current need for supplemental 02 acute.

## 2017-06-27 NOTE — Care Management Important Message (Signed)
Important Message  Patient Details  Name: Crystal Todd MRN: [id number] Date of Birth: 1927-06-06   Medicare Important Message Given:  Yes  Signed IM notice given   Eber Hong, RN 06/27/2017, 9:04 AM

## 2017-06-28 LAB — PROCALCITONIN: Procalcitonin: <0.1 ng/mL

## 2017-06-28 LAB — MRSA PCR SCREENING: MRSA BY PCR: NEGATIVE

## 2017-06-28 LAB — HIV ANTIBODY (ROUTINE TESTING W REFLEX): HIV SCREEN 4TH GENERATION: NONREACTIVE

## 2017-06-28 MED ORDER — IPRATROPIUM-ALBUTEROL 0.5-2.5 (3) MG/3ML IN SOLN
3.0000 mL | Freq: Three times a day (TID) | RESPIRATORY_TRACT | Status: DC
  Administered 2017-06-28 – 2017-06-30 (×7): 3 mL via RESPIRATORY_TRACT
  Filled 2017-06-28 (×7): qty 3

## 2017-06-28 NOTE — Progress Notes (Signed)
[employer] - [place] at [facility]   PATIENT NAME: Crystal Todd    MR#:  [id number]  DATE OF BIRTH:  18-Jun-1927  SUBJECTIVE:  CHIEF COMPLAINT:   Chief Complaint  Patient presents with  . Shortness of Breath   Patient continues to have shortness of breath and wheezing.  Saturations dropped to 85% with minimal ambulation in the room on room air. On isolation for flu.  REVIEW OF SYSTEMS:  CONSTITUTIONAL: No fever,have fatigue or weakness.  EYES: No blurred or double vision.  EARS, NOSE, AND THROAT: No tinnitus or ear pain.  RESPIRATORY: has cough, shortness of breath,no wheezing or hemoptysis.  CARDIOVASCULAR: No chest pain, orthopnea, edema.  GASTROINTESTINAL: No nausea, vomiting, diarrhea or abdominal pain.  GENITOURINARY: No dysuria, hematuria.  ENDOCRINE: No polyuria, nocturia,  HEMATOLOGY: No anemia, easy bruising or bleeding SKIN: No rash or lesion. MUSCULOSKELETAL: No joint pain or arthritis.   NEUROLOGIC: No tingling, numbness, weakness.  PSYCHIATRY: No anxiety or depression.   ROS  DRUG ALLERGIES:   Allergies  Allergen Reactions  . Ace Inhibitors     Other reaction(s): Other (See Comments) Other Reaction: Other reaction  . Alendronate     Other reaction(s): Other (See Comments) Other Reaction: Other reaction  . Influenza Vaccines Other (See Comments)    "kept the flu for a year"    VITALS:  Blood pressure (!) 123/59, pulse 69, temperature 97.6 F (36.4 C), temperature source Oral, resp. rate 16, height 5\' 4"  (1.626 m), weight 49.7 kg (109 lb 8 oz), SpO2 93 %.  PHYSICAL EXAMINATION:  GENERAL:  84 y.o.-year-old patient lying in the bed with no acute distress.  EYES: Pupils equal, round, reactive to light and accommodation. No scleral icterus. Extraocular muscles intact.  HEENT: Head atraumatic, normocephalic. Oropharynx and nasopharynx clear.  NECK:  Supple, no jugular venous distention. No thyroid enlargement, no tenderness.  LUNGS:  Bilateral wheezing and decreased air entry CARDIOVASCULAR: S1, S2 normal. No murmurs, rubs, or gallops.  ABDOMEN: Soft, nontender, nondistended. Bowel sounds present. No organomegaly or mass.  EXTREMITIES: No pedal edema, cyanosis, or clubbing.  NEUROLOGIC: Cranial nerves II through XII are intact. Muscle strength 4-5/5 in all extremities. Sensation intact. Gait not checked.  PSYCHIATRIC: The patient is alert and oriented x 3.  SKIN: No obvious rash, lesion, or ulcer.   Physical Exam LABORATORY PANEL:   CBC Recent Labs  Lab 06/27/17 0613  WBC 5.0  HGB 11.5*  HCT 35.8  PLT 136*   ------------------------------------------------------------------------------------------------------------------  Chemistries  Recent Labs  Lab 06/27/17 0613  NA 140  K 4.4  CL 107  CO2 22  GLUCOSE 143*  BUN 12  CREATININE 0.62  CALCIUM 8.2*   ------------------------------------------------------------------------------------------------------------------  Cardiac Enzymes Recent Labs  Lab 06/26/17 1546  TROPONINI <0.03   ------------------------------------------------------------------------------------------------------------------  RADIOLOGY:  Dg Chest Portable 1 View  Result Date: 06/26/2017 CLINICAL DATA:  Shortness of breath and cough today, history asthma, COPD, hypertension, former smoker EXAM: PORTABLE CHEST 1 VIEW COMPARISON:  06/18/2011 FINDINGS: Enlargement of cardiac silhouette. Atherosclerotic calcification aorta. Kyphotic positioning. Mediastinal contours and pulmonary vascularity normal. Peribronchial thickening with atelectasis versus infiltrate at LEFT lower lobe. No additional acute infiltrate, pleural effusion or pneumothorax. Diffuse osseous demineralization IMPRESSION: Enlargement of cardiac silhouette. Bronchitic changes with atelectasis versus consolidation LEFT lower lobe. Electronically Signed   By: 08/18/2011 M.D.   On: 06/26/2017 16:22    ASSESSMENT AND PLAN:    Active Problems:   PNA (pneumonia)  Jaz Laningham  is a  84 y.o. female with a known history of COPD, essential hypertension, prior paroxysmal atrial fibrillation, hyperlipidemia, is brought into the ED from independent living facility for shortness of breath associated with cough which has been progressively getting worse.    #Acute hypoxic respiratory failure secondary to influenza A and COPD exacerbation Left lower lobe atelectasis versus infiltrate found on chest x-ray.  Pro-calcitonin less than 0.1.  Not bacterial. -IV steroids, Antibiotics - Scheduled Nebulizers - Inhalers -Wean O2 as tolerated - Consult pulmonary if no improvement  #Essential hypertension continue home medication Norvasc and metoprolol  #Paroxysmal atrial fibrillation rate controlled Continue Toprol for rate control and aspirin  Likely discharge home tomorrow.  All the records are reviewed and case discussed with Care Management/Social Workerr. Management plans discussed with the patient, family and they are in agreement.  CODE STATUS: Full.  TOTAL TIME TAKING CARE OF THIS PATIENT: 35 minutes.   POSSIBLE D/C IN 1-2 DAYS, DEPENDING ON CLINICAL CONDITION.  Orie Fisherman M.D on 06/28/2017   Between 7am to 6pm - Pager - [phone]  After 6pm go to [email or web address] - password [email or web address]  [facility]  Office  [phone]  CC: Primary care physician; Patient, No Pcp Per  Note: This dictation was prepared with Dragon dictation along with smaller phrase technology. Any transcriptional errors that result from this process are unintentional.

## 2017-06-28 NOTE — Progress Notes (Signed)
Took over pt care at 1500, pt with no complaints 

## 2017-06-28 NOTE — Progress Notes (Signed)
Pt has been on room air at rest all day, with 02 Sats in low to mid 90s. Upon exertion and walking, pt's 02 sats dropped to 85. MD notified. Pt will stay another day, and a q4 flutter valve has been ordered

## 2017-06-29 MED ORDER — POLYETHYLENE GLYCOL 3350 17 G PO PACK
17.0000 g | PACK | Freq: Two times a day (BID) | ORAL | Status: DC
  Administered 2017-06-29: 17 g via ORAL
  Filled 2017-06-29 (×3): qty 1

## 2017-06-29 NOTE — Plan of Care (Signed)
  Problem: Education: Goal: Knowledge of General Education information will improve Outcome: Progressing   Problem: Safety: Goal: Ability to remain free from injury will improve Outcome: Progressing   Problem: Activity: Goal: Ability to tolerate increased activity will improve Outcome: Progressing   Problem: Clinical Measurements: Goal: Ability to maintain a body temperature in the normal range will improve Outcome: Progressing   Problem: Respiratory: Goal: Ability to maintain adequate ventilation will improve Outcome: Progressing

## 2017-06-29 NOTE — Progress Notes (Signed)
[employer] - [place] at [facility]   PATIENT NAME: Crystal Todd    MR#:  [id number]  DATE OF BIRTH:  [birth year]  SUBJECTIVE:  CHIEF COMPLAINT:   Chief Complaint  Patient presents with  . Shortness of Breath   Continues to have shortness of breath and congestion in her chest. Dry cough. Saturations 87% on room air at rest.  REVIEW OF SYSTEMS:  CONSTITUTIONAL: No fever,have fatigue or weakness.  EYES: No blurred or double vision.  EARS, NOSE, AND THROAT: No tinnitus or ear pain.  RESPIRATORY: has cough, shortness of breath,no wheezing or hemoptysis.  CARDIOVASCULAR: No chest pain, orthopnea, edema.  GASTROINTESTINAL: No nausea, vomiting, diarrhea or abdominal pain.  GENITOURINARY: No dysuria, hematuria.  ENDOCRINE: No polyuria, nocturia,  HEMATOLOGY: No anemia, easy bruising or bleeding SKIN: No rash or lesion. MUSCULOSKELETAL: No joint pain or arthritis.   NEUROLOGIC: No tingling, numbness, weakness.  PSYCHIATRY: No anxiety or depression.   ROS  DRUG ALLERGIES:   Allergies  Allergen Reactions  . Ace Inhibitors     Other reaction(s): Other (See Comments) Other Reaction: Other reaction  . Alendronate     Other reaction(s): Other (See Comments) Other Reaction: Other reaction  . Influenza Vaccines Other (See Comments)    "kept the flu for a year"    VITALS:  Blood pressure (!) 144/60, pulse 74, temperature 98.3 F (36.8 C), temperature source Oral, resp. rate 20, height 5\' 4"  (1.626 m), weight 49.7 kg (109 lb 8 oz), SpO2 93 %.  PHYSICAL EXAMINATION:  GENERAL:  84 y.o.-year-old patient lying in the bed with no acute distress.  EYES: Pupils equal, round, reactive to light and accommodation. No scleral icterus. Extraocular muscles intact.  HEENT: Head atraumatic, normocephalic. Oropharynx and nasopharynx clear.  NECK:  Supple, no jugular venous distention. No thyroid enlargement, no tenderness.  LUNGS: Bilateral wheezing and decreased air  entry CARDIOVASCULAR: S1, S2 normal. No murmurs, rubs, or gallops.  ABDOMEN: Soft, nontender, nondistended. Bowel sounds present. No organomegaly or mass.  EXTREMITIES: No pedal edema, cyanosis, or clubbing.  NEUROLOGIC: Cranial nerves II through XII are intact. Muscle strength 4-5/5 in all extremities. Sensation intact. Gait not checked.  PSYCHIATRIC: The patient is alert and oriented x 3.  SKIN: No obvious rash, lesion, or ulcer.   Physical Exam LABORATORY PANEL:   CBC Recent Labs  Lab 06/27/17 0613  WBC 5.0  HGB 11.5*  HCT 35.8  PLT 136*   ------------------------------------------------------------------------------------------------------------------  Chemistries  Recent Labs  Lab 06/27/17 0613  NA 140  K 4.4  CL 107  CO2 22  GLUCOSE 143*  BUN 12  CREATININE 0.62  CALCIUM 8.2*   ------------------------------------------------------------------------------------------------------------------  Cardiac Enzymes Recent Labs  Lab 06/26/17 1546  TROPONINI <0.03   ------------------------------------------------------------------------------------------------------------------  RADIOLOGY:  No results found.  ASSESSMENT AND PLAN:   Active Problems:   PNA (pneumonia)  Crystal Todd  is a 84 y.o. female with a known history of COPD, essential hypertension, prior paroxysmal atrial fibrillation, hyperlipidemia, is brought into the ED from independent living facility for shortness of breath associated with cough which has been progressively getting worse.    #Acute hypoxic respiratory failure secondary to influenza A and COPD exacerbation Left lower lobe atelectasis versus infiltrate found on chest x-ray.  Pro-calcitonin less than 0.1.  Not bacterial. -IV steroids, Tamiflu - Scheduled Nebulizers - Inhalers -Wean O2 as tolerated  #Essential hypertension continue home medication Norvasc and metoprolol  #Paroxysmal atrial fibrillation rate  controlled Continue Toprol for rate control and  aspirin  We will continue to wean oxygen.  Likely discharge tomorrow with oxygen.  All the records are reviewed and case discussed with Care Management/Social Workerr. Management plans discussed with the patient, family and they are in agreement.  CODE STATUS: Full.  TOTAL TIME TAKING CARE OF THIS PATIENT: 35 minutes.   POSSIBLE D/C IN 1-2 DAYS, DEPENDING ON CLINICAL CONDITION.  Orie Fisherman M.D on 06/29/2017   Between 7am to 6pm - Pager - [phone]  After 6pm go to [email or web address] - password [email or web address]  [facility] [place] [facility]  Office  [phone]  CC: Primary care physician; Patient, No Pcp Per  Note: This dictation was prepared with Dragon dictation along with smaller phrase technology. Any transcriptional errors that result from this process are unintentional.

## 2017-06-30 LAB — LEGIONELLA PNEUMOPHILA SEROGP 1 UR AG: L. pneumophila Serogp 1 Ur Ag: NEGATIVE

## 2017-06-30 MED ORDER — PREDNISONE 10 MG (21) PO TBPK: ORAL_TABLET | ORAL | 0 refills | Status: DC

## 2017-06-30 MED ORDER — IPRATROPIUM-ALBUTEROL 0.5-2.5 (3) MG/3ML IN SOLN: 3.0000 mL | Freq: Four times a day (QID) | RESPIRATORY_TRACT | 0 refills | Status: AC | PRN

## 2017-06-30 MED ORDER — OSELTAMIVIR PHOSPHATE 30 MG PO CAPS: 30.0000 mg | ORAL_CAPSULE | Freq: Two times a day (BID) | ORAL | 0 refills | Status: AC

## 2017-06-30 NOTE — Progress Notes (Signed)
EMS arrived to get pt.  All belongings sent minus the oxygen tank from [facility].  EMS would not take it with them because they said it should have been delivered to [facility].  Tank put at front desk and Diplomatic Services operational officer notified.

## 2017-06-30 NOTE — [place] FL2 (Signed)
[place] MEDICAID FL2 LEVEL OF CARE SCREENING TOOL     IDENTIFICATION  Patient Name: Crystal Todd Birthdate: [birth year] Sex: female Admission Date (Current Location): 06/26/2017  [place] and [place] Number:  [address] [id number] [facility] Facility and Address:  [facility], [address] [place], [place] [address]      Provider Number: [id number]  Attending Physician Name and Address:  Milagros Loll, MD  Relative Name and Phone Number:  Concha Pyo Niece [phone]  [phone]     Current Level of Care: Hospital Recommended Level of Care: Assisted Living Facility([facility]) Prior Approval Number:    Date Approved/Denied:   PASRR Number:    Discharge Plan: Other (Comment)([facility])    Current Diagnoses: Patient Active Problem List   Diagnosis Date Noted  . PNA (pneumonia) 06/26/2017    Orientation RESPIRATION BLADDER Height & Weight     Self, Time, Situation, Place  O2(2L) Incontinent Weight: 109 lb 8 oz (49.7 kg) Height:  5\' 4"  (162.6 cm)  BEHAVIORAL SYMPTOMS/MOOD NEUROLOGICAL BOWEL NUTRITION STATUS      Continent Diet(Regular diet)  AMBULATORY STATUS COMMUNICATION OF NEEDS Skin   Limited Assist Verbally Normal                       Personal Care Assistance Level of Assistance  Feeding, Bathing, Dressing Bathing Assistance: Limited assistance Feeding assistance: Independent Dressing Assistance: Limited assistance     Functional Limitations Info  Hearing, Speech, Sight Sight Info: Adequate Hearing Info: Adequate Speech Info: Adequate    SPECIAL CARE FACTORS FREQUENCY                       Contractures Contractures Info: Not present    Additional Factors Info  Code Status, Allergies, Psychotropic Code Status Info: Full Code Allergies Info: ACE INHIBITORS, ALENDRONATE, INFLUENZA VACCINES Psychotropic Info: divalproex (DEPAKOTE SPRINKLE) capsule 125 mg and divalproex (DEPAKOTE SPRINKLE) capsule 250 mg  and PARoxetine (PAXIL) tablet 20 mg          Current Medications (06/30/2017):  This is the current hospital active medication list Current Facility-Administered Medications  Medication Dose Route Frequency Provider Last Rate Last Dose  . acetaminophen (TYLENOL) tablet 650 mg  650 mg Oral Q4H PRN Gouru, Aruna, MD      . albuterol (PROVENTIL) (2.5 MG/3ML) 0.083% nebulizer solution 2.5 mg  2.5 mg Nebulization Q4H PRN Gouru, Aruna, MD      . alum & mag hydroxide-simeth (MAALOX/MYLANTA) 200-200-20 MG/5ML suspension 30 mL  30 mL Oral Q6H PRN Gouru, Aruna, MD      . amLODipine (NORVASC) tablet 5 mg  5 mg Oral Daily Gouru, Aruna, MD   5 mg at 06/30/17 1038  . cholecalciferol (VITAMIN D) tablet 1,000 Units  1,000 Units Oral Daily Gouru, Aruna, MD   1,000 Units at 06/30/17 1153  . diphenhydrAMINE (BENADRYL) capsule 25 mg  25 mg Oral Q6H PRN Gouru, Aruna, MD      . divalproex (DEPAKOTE SPRINKLE) capsule 125 mg  125 mg Oral q morning - 10a Gouru, Aruna, MD   125 mg at 06/30/17 1036  . divalproex (DEPAKOTE SPRINKLE) capsule 250 mg  250 mg Oral QHS Gouru, Aruna, MD   250 mg at 06/29/17 2118  . enoxaparin (LOVENOX) injection 40 mg  40 mg Subcutaneous Q24H Gouru, Aruna, MD   40 mg at 06/28/17 2136  . fluticasone (FLONASE) 50 MCG/ACT nasal spray 2 spray  2 spray Each Nare BID Gouru, Aruna,  MD   2 spray at 06/30/17 1040  . guaifenesin (ROBITUSSIN) 100 MG/5ML syrup 300 mg  300 mg Oral Q6H PRN Gouru, Aruna, MD      . ipratropium-albuterol (DUONEB) 0.5-2.5 (3) MG/3ML nebulizer solution 3 mL  3 mL Nebulization TID Gouru, Aruna, MD   3 mL at 06/30/17 1309  . loperamide (IMODIUM) capsule 4 mg  4 mg Oral PRN Gouru, Aruna, MD      . magnesium hydroxide (MILK OF MAGNESIA) suspension 30 mL  30 mL Oral Daily PRN Gouru, Aruna, MD      . MEDLINE mouth rinse  15 mL Mouth Rinse BID Altamese Dilling, MD   15 mL at 06/30/17 1056  . methylPREDNISolone sodium succinate (SOLU-MEDROL) 125 mg/2 mL injection 60 mg  60 mg  Intravenous Q8H Gouru, Aruna, MD   60 mg at 06/30/17 1312  . metoprolol tartrate (LOPRESSOR) tablet 12.5 mg  12.5 mg Oral BID Gouru, Aruna, MD   12.5 mg at 06/30/17 1036  . oseltamivir (TAMIFLU) capsule 30 mg  30 mg Oral BID Altamese Dilling, MD   30 mg at 06/30/17 1036  . pantoprazole (PROTONIX) EC tablet 40 mg  40 mg Oral Daily Gouru, Aruna, MD   40 mg at 06/30/17 1036  . PARoxetine (PAXIL) tablet 20 mg  20 mg Oral q morning - 10a Gouru, Aruna, MD   20 mg at 06/30/17 1035  . polyethylene glycol (MIRALAX / GLYCOLAX) packet 17 g  17 g Oral BID Milagros Loll, MD   17 g at 06/29/17 0945  . vitamin B-12 (CYANOCOBALAMIN) tablet 1,000 mcg  1,000 mcg Oral Daily Gouru, Aruna, MD   1,000 mcg at 06/30/17 1036     Discharge Medications: TAKE these medications          acetaminophen 325 MG tablet Commonly known as:  TYLENOL Take 650 mg by mouth every 4 (four) hours as needed for fever (up to 101).   amLODipine 5 MG tablet Commonly known as:  NORVASC Take 5 mg by mouth daily.   ANTACID 200-200-20 MG/5ML suspension Generic drug:  alum & mag hydroxide-simeth Take 30 mLs by mouth every 6 (six) hours as needed for indigestion or heartburn.   cholecalciferol 1000 units tablet Commonly known as:  VITAMIN D Take 1,000 Units by mouth daily.   diphenhydrAMINE 25 MG tablet Commonly known as:  BENADRYL Take 25 mg by mouth every 6 (six) hours as needed for allergies.   divalproex 125 MG capsule Commonly known as:  DEPAKOTE SPRINKLE Take 125-250 mg by mouth See admin instructions. Take 1 capsule by mouth every morning, and take 2 capsules (250 mg) by mouth every night at bedtime.   fluticasone 50 MCG/ACT nasal spray Commonly known as:  FLONASE Place 2 sprays into the nose 2 (two) times daily.   guaiFENesin 100 MG/5ML liquid Commonly known as:  ROBITUSSIN Take 300 mg by mouth every 6 (six) hours as needed for cough.   IMODIUM A-D 2 MG capsule Generic drug:  loperamide Take 4 mg by  mouth as needed for diarrhea or loose stools.   ipratropium-albuterol 0.5-2.5 (3) MG/3ML Soln Commonly known as:  DUONEB Take 3 mLs by nebulization every 6 (six) hours as needed (SOB or wheezing).   magnesium hydroxide 400 MG/5ML suspension Commonly known as:  MILK OF MAGNESIA Take 30 mLs by mouth daily as needed for mild constipation.   metoprolol tartrate 25 MG tablet Commonly known as:  LOPRESSOR Take 12.5 mg by mouth 2 (two) times daily.  multivitamin Tabs tablet Take 1 tablet by mouth daily.   omeprazole 20 MG capsule Commonly known as:  PRILOSEC Take 20 mg by mouth daily.   oseltamivir 30 MG capsule Commonly known as:  TAMIFLU Take 1 capsule (30 mg total) by mouth 2 (two) times daily.   PARoxetine 20 MG tablet Commonly known as:  PAXIL Take 20 mg by mouth every morning.   predniSONE 10 MG (21) Tbpk tablet Commonly known as:  STERAPRED UNI-PAK 21 TAB 6 tabs day 1 and taper 1 tab a day   vitamin B-12 1000 MCG tablet Commonly known as:  CYANOCOBALAMIN Take 1,000 mcg by mouth daily.      Relevant Imaging Results:  Relevant Lab Results:   Additional Information SSN [id number]  Darleene Cleaver, [place]

## 2017-06-30 NOTE — Progress Notes (Signed)
02 sats on room air without exertion: 94 02 sats on room air with exertion: 86 02 sats on 2L 02 with exertion:94

## 2017-06-30 NOTE — Care Management (Addendum)
Dr. Elpidio Anis indicated that Ms. Crystal Todd may need home oxygen ([facility]). Spoke with nurse Maddy. States she will try to wean off O2 prior to discharge. Ordered nebulizer per [facility]. Will order Home Health Services per [facility]. Telephone call to [facility].  Discharge to home today per Dr. Claude Manges RN MSN CCM Care Management [phone]

## 2017-06-30 NOTE — Clinical Social Work Note (Addendum)
Patient to be d/c'ed today to [facility] once oxygen has been delivered.  Patient and family agreeable to plans will transport via ems RN to call report to [phone].  CSW attempted to contact patient's niece Steward Drone at [phone], left a message on voicemail to inform her that patient is discharging today.  Windell Moulding, MSW, Theresia Majors [phone]

## 2017-06-30 NOTE — Discharge Instructions (Signed)
Resume diet and activity as before ° ° °

## 2017-06-30 NOTE — Discharge Summary (Signed)
[employer] - [place] at [facility]   PATIENT NAME: Crystal Todd    MR#:  [id number]  DATE OF BIRTH:  March 17, 1928  DATE OF ADMISSION:  06/26/2017 ADMITTING PHYSICIAN: Ramonita Lab, MD  DATE OF DISCHARGE: No discharge date for patient encounter.  PRIMARY CARE PHYSICIAN: Patient, No Pcp Per   ADMISSION DIAGNOSIS:  Influenza A [J10.1] COPD exacerbation (HCC) [J44.1] Acute respiratory failure with hypoxia (HCC) [J96.01] Community acquired pneumonia of left lower lobe of lung (HCC) [J18.1]  DISCHARGE DIAGNOSIS:  Active Problems:   PNA (pneumonia)   SECONDARY DIAGNOSIS:   Past Medical History:  Diagnosis Date  . Arthritis    hands, knees  . Asthma   . COPD (chronic obstructive pulmonary disease) (HCC)   . Dysrhythmia    A-Fib  . Full dentures    upper and lower  . GERD (gastroesophageal reflux disease)   . Hypercholesteremia   . Hypertension   . Osteoporosis   . Paroxysmal atrial fibrillation (HCC)   . Shortness of breath dyspnea      ADMITTING HISTORY  HISTORY OF PRESENT ILLNESS:  Crystal Todd  is a 84 y.o. female with a known history of COPD, essential hypertension, prior paroxysmal atrial fibrillation, hyperlipidemia, is brought into the ED from independent living facility for shortness of breath associated with cough which has been progressively getting worse.  Today she woke up with congestion sneezing and generalized weakness.  Patient denies any abdominal pain nausea vomiting or chest pain.  Patient reports few episodes of watery diarrhea which is improved now.  Denies any fever.  Patient is hypoxemic saturating 85% on room air requiring 2 L of oxygen.  Chest x-ray with the consolidation and a flu test is positive for influenza A.  Hospitalist team is called to admit the patient     HOSPITAL COURSE:   MargaretBentonis a89 y.o.femalewith a known history of COPD, essential hypertension, prior paroxysmal atrial fibrillation,  hyperlipidemia,is brought into the ED from independent living facility for shortness of breath associated with cough which has been progressively getting worse.   #Acute hypoxic respiratory failure secondary to influenza A and COPD exacerbation Left lower lobe atelectasis versus infiltrate found on chest x-ray.  Pro-calcitonin less than 0.1.  Not bacterial. Treated with IV steroids, Tamiflu, nebulizers.  Patient is improving well.  She still has some mild wheezing on day of discharge.  Will need a prednisone taper.  Prescription for Tamiflu to finish her 5-day course.  Continues to need oxygen this has been set up.  Nebulizer as needed.  #Essential hypertension continue home medication Norvasc and metoprolol  #Paroxysmal atrial fibrillation rate controlled Continue Toprol for rate control and aspirin  Patient is stable to discharge back to her assisted living facility with home health services and oxygen.   CONSULTS OBTAINED:    DRUG ALLERGIES:   Allergies  Allergen Reactions  . Ace Inhibitors     Other reaction(s): Other (See Comments) Other Reaction: Other reaction  . Alendronate     Other reaction(s): Other (See Comments) Other Reaction: Other reaction  . Influenza Vaccines Other (See Comments)    "kept the flu for a year"    DISCHARGE MEDICATIONS:   Allergies as of 06/30/2017      Reactions   Ace Inhibitors    Other reaction(s): Other (See Comments) Other Reaction: Other reaction   Alendronate    Other reaction(s): Other (See Comments) Other Reaction: Other reaction   Influenza Vaccines Other (See Comments)   "kept the flu  for a year"      Medication List    TAKE these medications   acetaminophen 325 MG tablet Commonly known as:  TYLENOL Take 650 mg by mouth every 4 (four) hours as needed for fever (up to 101).   amLODipine 5 MG tablet Commonly known as:  NORVASC Take 5 mg by mouth daily.   ANTACID 200-200-20 MG/5ML suspension Generic drug:  alum &  mag hydroxide-simeth Take 30 mLs by mouth every 6 (six) hours as needed for indigestion or heartburn.   cholecalciferol 1000 units tablet Commonly known as:  VITAMIN D Take 1,000 Units by mouth daily.   diphenhydrAMINE 25 MG tablet Commonly known as:  BENADRYL Take 25 mg by mouth every 6 (six) hours as needed for allergies.   divalproex 125 MG capsule Commonly known as:  DEPAKOTE SPRINKLE Take 125-250 mg by mouth See admin instructions. Take 1 capsule by mouth every morning, and take 2 capsules (250 mg) by mouth every night at bedtime.   fluticasone 50 MCG/ACT nasal spray Commonly known as:  FLONASE Place 2 sprays into the nose 2 (two) times daily.   guaiFENesin 100 MG/5ML liquid Commonly known as:  ROBITUSSIN Take 300 mg by mouth every 6 (six) hours as needed for cough.   IMODIUM A-D 2 MG capsule Generic drug:  loperamide Take 4 mg by mouth as needed for diarrhea or loose stools.   ipratropium-albuterol 0.5-2.5 (3) MG/3ML Soln Commonly known as:  DUONEB Take 3 mLs by nebulization every 6 (six) hours as needed (SOB or wheezing).   magnesium hydroxide 400 MG/5ML suspension Commonly known as:  MILK OF MAGNESIA Take 30 mLs by mouth daily as needed for mild constipation.   metoprolol tartrate 25 MG tablet Commonly known as:  LOPRESSOR Take 12.5 mg by mouth 2 (two) times daily.   multivitamin Tabs tablet Take 1 tablet by mouth daily.   omeprazole 20 MG capsule Commonly known as:  PRILOSEC Take 20 mg by mouth daily.   oseltamivir 30 MG capsule Commonly known as:  TAMIFLU Take 1 capsule (30 mg total) by mouth 2 (two) times daily.   PARoxetine 20 MG tablet Commonly known as:  PAXIL Take 20 mg by mouth every morning.   predniSONE 10 MG (21) Tbpk tablet Commonly known as:  STERAPRED UNI-PAK 21 TAB 6 tabs day 1 and taper 1 tab a day   vitamin B-12 1000 MCG tablet Commonly known as:  CYANOCOBALAMIN Take 1,000 mcg by mouth daily.            Durable Medical  Equipment  (From admission, onward)        Start     Ordered   06/30/17 1038  For home use only DME Nebulizer machine  Once    Comments:  COPD  Question:  Patient needs a nebulizer to treat with the following condition  Answer:  COPD (chronic obstructive pulmonary disease) (HCC)   06/30/17 1038   06/30/17 1034  For home use only DME oxygen  Once    Question Answer Comment  Mode or (Route) Nasal cannula   Liters per Minute 2   Frequency Continuous (stationary and portable oxygen unit needed)   Oxygen delivery system Gas      06/30/17 1034      Today   VITAL SIGNS:  Blood pressure (!) 142/66, pulse 69, temperature (!) 97.2 F (36.2 C), resp. rate 16, height 5\' 4"  (1.626 m), weight 49.7 kg (109 lb 8 oz), SpO2 96 %.  I/O:  No intake or output data in the 24 hours ending 06/30/17 1116  PHYSICAL EXAMINATION:  Physical Exam  GENERAL:  84 y.o.-year-old patient lying in the bed with no acute distress.  LUNGS: Bilateral mild wheezing CARDIOVASCULAR: S1, S2 normal. No murmurs, rubs, or gallops.  ABDOMEN: Soft, non-tender, non-distended. Bowel sounds present. No organomegaly or mass.  NEUROLOGIC: Moves all 4 extremities. PSYCHIATRIC: The patient is alert and oriented x 3.  SKIN: No obvious rash, lesion, or ulcer.   DATA REVIEW:   CBC Recent Labs  Lab 06/27/17 0613  WBC 5.0  HGB 11.5*  HCT 35.8  PLT 136*    Chemistries  Recent Labs  Lab 06/27/17 0613  NA 140  K 4.4  CL 107  CO2 22  GLUCOSE 143*  BUN 12  CREATININE 0.62  CALCIUM 8.2*    Cardiac Enzymes Recent Labs  Lab 06/26/17 1546  TROPONINI <0.03    Microbiology Results  Results for orders placed or performed during the hospital encounter of 06/26/17  Blood culture (routine x 2)     Status: None (Preliminary result)   Collection Time: 06/26/17  5:36 PM  Result Value Ref Range Status   Specimen Description BLOOD RFOA  Final   Special Requests   Final    BOTTLES DRAWN AEROBIC AND ANAEROBIC Blood  Culture adequate volume   Culture   Final    NO GROWTH 4 DAYS Performed at [facility], [address] [place], [place] [address]    Report Status PENDING  Incomplete  Blood culture (routine x 2)     Status: None (Preliminary result)   Collection Time: 06/26/17  5:36 PM  Result Value Ref Range Status   Specimen Description BLOOD LEFT WRIST  Final   Special Requests   Final    BOTTLES DRAWN AEROBIC AND ANAEROBIC Blood Culture adequate volume   Culture   Final    NO GROWTH 4 DAYS Performed at [facility], [address] [place], [place] [address]    Report Status PENDING  Incomplete  MRSA PCR Screening     Status: None   Collection Time: 06/28/17  5:10 AM  Result Value Ref Range Status   MRSA by PCR NEGATIVE NEGATIVE Final    Comment:        The GeneXpert MRSA Assay (FDA approved for NASAL specimens only), is one component of a comprehensive MRSA colonization surveillance program. It is not intended to diagnose MRSA infection nor to guide or monitor treatment for MRSA infections. Performed at [facility], [address], [place], [place] [address]     RADIOLOGY:  No results found.  Follow up with PCP in 1 week.  Management plans discussed with the patient, family and they are in agreement.  CODE STATUS:     Code Status Orders  (From admission, onward)        Start     Ordered   06/26/17 2151  Full code  Continuous     06/26/17 2150    Code Status History    This patient has a current code status but no historical code status.    Advance Directive Documentation     Most Recent Value  Type of Advance Directive  Healthcare Power of Attorney  Pre-existing out of facility DNR order (yellow form or pink MOST form)  -  "MOST" Form in Place?  -      TOTAL TIME TAKING CARE OF THIS PATIENT ON DAY OF DISCHARGE: more than 30  minutes.   Molinda Bailiff Laisa Larrick M.D on 06/30/2017 at 11:16 AM  Between 7am to 6pm - Pager -  [phone]  After 6pm go to [email or web address] - password [email or web address]  [facility] [place] [facility]  Office  [phone]  CC: Primary care physician; Patient, No Pcp Per  Note: This dictation was prepared with Dragon dictation along with smaller phrase technology. Any transcriptional errors that result from this process are unintentional.

## 2017-06-30 NOTE — Care Management (Signed)
Unable to wean off oxygen. [employer]. [facility] representative updated. Gwenette Greet RN MSN CCM Care Management [phone]

## 2017-06-30 NOTE — Progress Notes (Signed)
Pt VS stable. A&Ox4 with minimal confusion. Report called to accepting RN at the [facility]. Transport via EMS, called and awaiting. IVs removed. No distress noted. Will continue to monitor.  Leroy Sea, LPN

## 2017-07-01 LAB — CULTURE, BLOOD (ROUTINE X 2)
CULTURE: NO GROWTH
Culture: NO GROWTH
Special Requests: ADEQUATE
Special Requests: ADEQUATE

## 2017-07-02 ENCOUNTER — Emergency Department
Admission: EM | Admit: 2017-07-02 | Discharge: 2017-07-02 | Disposition: A | Discharge status: Nursing Home (Includes ICF) | Payer: Medicare Other | Source: Home / Self Care | Attending: Student in an Organized Health Care Education/Training Program | Admitting: Student in an Organized Health Care Education/Training Program

## 2017-07-02 ENCOUNTER — Emergency Department: Payer: Medicare Other

## 2017-07-02 ENCOUNTER — Encounter: Payer: Self-pay | Admitting: *Deleted

## 2017-07-02 ENCOUNTER — Other Ambulatory Visit: Payer: Self-pay

## 2017-07-02 DIAGNOSIS — R531 Weakness: Secondary | ICD-10-CM

## 2017-07-02 DIAGNOSIS — I10 Essential (primary) hypertension: Secondary | ICD-10-CM | POA: Insufficient documentation

## 2017-07-02 DIAGNOSIS — Z79899 Other long term (current) drug therapy: Secondary | ICD-10-CM

## 2017-07-02 DIAGNOSIS — R0602 Shortness of breath: Secondary | ICD-10-CM | POA: Insufficient documentation

## 2017-07-02 DIAGNOSIS — J441 Chronic obstructive pulmonary disease with (acute) exacerbation: Secondary | ICD-10-CM | POA: Insufficient documentation

## 2017-07-02 DIAGNOSIS — Z87891 Personal history of nicotine dependence: Secondary | ICD-10-CM | POA: Insufficient documentation

## 2017-07-02 DIAGNOSIS — A419 Sepsis, unspecified organism: Secondary | ICD-10-CM | POA: Diagnosis not present

## 2017-07-02 LAB — COMPREHENSIVE METABOLIC PANEL
ALT: 21 U/L (ref 14–54)
ANION GAP: 13 (ref 5–15)
AST: 20 U/L (ref 15–41)
Albumin: 3.6 g/dL (ref 3.5–5.0)
Alkaline Phosphatase: 90 U/L (ref 38–126)
BUN: 18 mg/dL (ref 6–20)
CO2: 24 mmol/L (ref 22–32)
Calcium: 8.5 mg/dL — ABNORMAL LOW (ref 8.9–10.3)
Chloride: 97 mmol/L — ABNORMAL LOW (ref 101–111)
Creatinine, Ser: 0.75 mg/dL (ref 0.44–1.00)
Glucose, Bld: 117 mg/dL — ABNORMAL HIGH (ref 65–99)
POTASSIUM: 4.5 mmol/L (ref 3.5–5.1)
Sodium: 134 mmol/L — ABNORMAL LOW (ref 135–145)
TOTAL PROTEIN: 7.5 g/dL (ref 6.5–8.1)
Total Bilirubin: 1 mg/dL (ref 0.3–1.2)

## 2017-07-02 LAB — CBC WITH DIFFERENTIAL/PLATELET
BASOS PCT: 0 %
Basophils Absolute: 0 10*3/uL (ref 0–0.1)
Eosinophils Absolute: 0 10*3/uL (ref 0–0.7)
Eosinophils Relative: 0 %
HEMATOCRIT: 44.3 % (ref 35.0–47.0)
Hemoglobin: 14.4 g/dL (ref 12.0–16.0)
LYMPHS ABS: 1.1 10*3/uL (ref 1.0–3.6)
LYMPHS PCT: 7 %
MCH: 27 pg (ref 26.0–34.0)
MCHC: 32.5 g/dL (ref 32.0–36.0)
MCV: 83.1 fL (ref 80.0–100.0)
MONOS PCT: 9 %
Monocytes Absolute: 1.4 10*3/uL — ABNORMAL HIGH (ref 0.2–0.9)
NEUTROS ABS: 12.7 10*3/uL — AB (ref 1.4–6.5)
Neutrophils Relative %: 84 %
PLATELETS: 218 10*3/uL (ref 150–440)
RBC: 5.33 MIL/uL — ABNORMAL HIGH (ref 3.80–5.20)
RDW: 16.8 % — AB (ref 11.5–14.5)
WBC: 15.2 10*3/uL — ABNORMAL HIGH (ref 3.6–11.0)

## 2017-07-02 LAB — BLOOD GAS, VENOUS
Acid-Base Excess: 4.5 mmol/L — ABNORMAL HIGH (ref 0.0–2.0)
BICARBONATE: 29.8 mmol/L — AB (ref 20.0–28.0)
Patient temperature: 37
pCO2, Ven: 46 mmHg (ref 44.0–60.0)
pH, Ven: 7.42 (ref 7.250–7.430)

## 2017-07-02 LAB — LACTIC ACID, PLASMA: LACTIC ACID, VENOUS: 1.9 mmol/L (ref 0.5–1.9)

## 2017-07-02 LAB — BRAIN NATRIURETIC PEPTIDE: B NATRIURETIC PEPTIDE 5: 252 pg/mL — AB (ref 0.0–100.0)

## 2017-07-02 LAB — TROPONIN I

## 2017-07-02 MED ORDER — METHYLPREDNISOLONE SODIUM SUCC 125 MG IJ SOLR
60.0000 mg | Freq: Once | INTRAMUSCULAR | Status: AC
  Administered 2017-07-02: 60 mg via INTRAVENOUS
  Filled 2017-07-02: qty 2

## 2017-07-02 MED ORDER — IPRATROPIUM-ALBUTEROL 0.5-2.5 (3) MG/3ML IN SOLN
RESPIRATORY_TRACT | Status: AC
  Filled 2017-07-02: qty 6

## 2017-07-02 MED ORDER — IPRATROPIUM-ALBUTEROL 0.5-2.5 (3) MG/3ML IN SOLN
3.0000 mL | Freq: Once | RESPIRATORY_TRACT | Status: AC
  Administered 2017-07-02: 3 mL via RESPIRATORY_TRACT

## 2017-07-02 MED ORDER — LEVOFLOXACIN 500 MG PO TABS
250.0000 mg | ORAL_TABLET | Freq: Once | ORAL | Status: AC
  Administered 2017-07-02: 250 mg via ORAL
  Filled 2017-07-02: qty 1

## 2017-07-02 MED ORDER — IPRATROPIUM-ALBUTEROL 0.5-2.5 (3) MG/3ML IN SOLN
3.0000 mL | Freq: Once | RESPIRATORY_TRACT | Status: AC
  Administered 2017-07-02: 3 mL via RESPIRATORY_TRACT
  Filled 2017-07-02: qty 3

## 2017-07-02 MED ORDER — LEVOFLOXACIN 250 MG PO TABS: 250.0000 mg | ORAL_TABLET | Freq: Every day | ORAL | 0 refills | Status: AC

## 2017-07-02 MED ORDER — ALBUTEROL SULFATE HFA 108 (90 BASE) MCG/ACT IN AERS: 2.0000 | INHALATION_SPRAY | Freq: Four times a day (QID) | RESPIRATORY_TRACT | 2 refills | Status: AC | PRN

## 2017-07-02 NOTE — ED Notes (Signed)
Patient transported to X-ray 

## 2017-07-02 NOTE — ED Provider Notes (Addendum)
[facility] Department Provider Note    First MD Initiated Contact with Patient 07/02/17 1109     (approximate)  I have reviewed the triage vital signs and the nursing notes.   HISTORY  Chief Complaint Weakness    HPI Crystal Todd is a 84 y.o. female with a history of COPD  on 2L home oxygen has a history of A. fib presents with chief complaint of generalized weakness.  Patient was seen in the ER this past week diagnosed with influenza.  Presents to the ER today due to worsening weakness and shortness of breath and nonproductive cough.  Denies any measured fevers.  States that she has not been taking nebulizer treatments.  Denies any nausea vomiting or abdominal pain.  No dysuria.  No lower extremity swelling.  Past Medical History:  Diagnosis Date  . Arthritis    hands, knees  . Asthma   . COPD (chronic obstructive pulmonary disease) (HCC)   . Dysrhythmia    A-Fib  . Full dentures    upper and lower  . GERD (gastroesophageal reflux disease)   . Hypercholesteremia   . Hypertension   . Osteoporosis   . Paroxysmal atrial fibrillation (HCC)   . Shortness of breath dyspnea    History reviewed. No pertinent family history. Past Surgical History:  Procedure Laterality Date  . BACK SURGERY     "cemented" discs  . CATARACT EXTRACTION W/PHACO Left 10/12/2014   Procedure: CATARACT EXTRACTION PHACO AND INTRAOCULAR LENS PLACEMENT (IOC);  Surgeon: Lockie Mola, MD;  Location: [facility];  Service: Ophthalmology;  Laterality: Left;  . ESOPHAGOGASTRODUODENOSCOPY (EGD) WITH ESOPHAGEAL DILATION    . JOINT REPLACEMENT     hip  . WRIST FRACTURE SURGERY Bilateral    Patient Active Problem List   Diagnosis Date Noted  . PNA (pneumonia) 06/26/2017      Prior to Admission medications   Medication Sig Start Date End Date Taking? Authorizing Provider  acetaminophen (TYLENOL) 325 MG tablet Take 650 mg by mouth every 4 (four) hours  as needed for fever (up to 101).   Yes [provider]  alum & mag hydroxide-simeth (ANTACID) 200-200-20 MG/5ML suspension Take 30 mLs by mouth every 6 (six) hours as needed for indigestion or heartburn.   Yes [provider]  amLODipine (NORVASC) 5 MG tablet Take 5 mg by mouth daily.   Yes [provider]  cholecalciferol (VITAMIN D) 1000 units tablet Take 1,000 Units by mouth daily.   Yes [provider]  diphenhydrAMINE (BENADRYL) 25 MG tablet Take 25 mg by mouth every 6 (six) hours as needed for allergies.   Yes [provider]  divalproex (DEPAKOTE SPRINKLE) 125 MG capsule Take 1 capsule by mouth every morning, and take 2 capsules (250 mg) by mouth every night at bedtime.    Yes [provider]  fluticasone (FLONASE) 50 MCG/ACT nasal spray Place 2 sprays into the nose 2 (two) times daily.   Yes [provider]  guaiFENesin (ROBITUSSIN) 100 MG/5ML liquid Take 300 mg by mouth every 6 (six) hours as needed for cough.   Yes [provider]  ipratropium-albuterol (DUONEB) 0.5-2.5 (3) MG/3ML SOLN Take 3 mLs by nebulization every 6 (six) hours as needed (SOB or wheezing). 06/30/17  Yes Sudini, Wardell Heath, MD  loperamide (IMODIUM A-D) 2 MG capsule Take 4 mg by mouth as needed for diarrhea or loose stools.   Yes [provider]  magnesium hydroxide (MILK OF MAGNESIA) 400 MG/5ML  suspension Take 30 mLs by mouth daily as needed for mild constipation.   Yes [provider]  metoprolol tartrate (LOPRESSOR) 25 MG tablet Take 12.5 mg by mouth 2 (two) times daily.   Yes [provider]  multivitamin (PROSIGHT) TABS tablet Take 1 tablet by mouth daily.   Yes [provider]  omeprazole (PRILOSEC) 20 MG capsule Take 20 mg by mouth daily.   Yes [provider]  oseltamivir (TAMIFLU) 30 MG capsule Take 1 capsule (30 mg total) by mouth 2 (two) times daily. 06/30/17  Yes Sudini, Wardell Heath, MD  PARoxetine (PAXIL)  20 MG tablet Take 20 mg by mouth every morning.   Yes [provider]  predniSONE (STERAPRED UNI-PAK 21 TAB) 10 MG (21) TBPK tablet 6 tabs day 1 and taper 1 tab a day 06/30/17  Yes Sudini, Srikar, MD  vitamin B-12 (CYANOCOBALAMIN) 1000 MCG tablet Take 1,000 mcg by mouth daily.   Yes [provider]  albuterol (PROVENTIL HFA;VENTOLIN HFA) 108 (90 Base) MCG/ACT inhaler Inhale 2 puffs into the lungs every 6 (six) hours as needed for wheezing or shortness of breath. 07/02/17   Willy Eddy, MD  levofloxacin (LEVAQUIN) 250 MG tablet Take 1 tablet (250 mg total) by mouth daily for 7 days. 07/02/17 07/09/17  Willy Eddy, MD    Allergies Ace inhibitors; Alendronate; and Influenza vaccines    Social History Social History   Tobacco Use  . Smoking status: Former Smoker    Last attempt to quit: 04/08/1974    Years since quitting: 43.2  . Smokeless tobacco: Never Used  Substance Use Topics  . Alcohol use: No  . Drug use: Not on file    Review of Systems Patient denies headaches, rhinorrhea, blurry vision, numbness, shortness of breath, chest pain, edema, cough, abdominal pain, nausea, vomiting, diarrhea, dysuria, fevers, rashes or hallucinations unless otherwise stated above in HPI. ____________________________________________   PHYSICAL EXAM:  VITAL SIGNS: Vitals:   07/02/17 1430 07/02/17 1500  BP: 134/71 137/68  Pulse: 84 84  Resp: (!) 27 (!) 25  Temp:    SpO2: 91% 92%    Constitutional: Alert and oriented. Frail and chronically ill appearing on suplemental 02 Eyes: Conjunctivae are normal.  Head: Atraumatic. Nose: No congestion/rhinnorhea. Mouth/Throat: Mucous membranes are moist.   Neck: No stridor. Painless ROM.  Cardiovascular: Normal rate, regular rhythm. Grossly normal heart sounds.  Good peripheral circulation. Respiratory: tachypnea with diminished breathsounds throughout.  No significant rhonchi or crackles noted.  Improvement in breathsounds  after nebulizer Gastrointestinal: Soft and nontender. No distention. No abdominal bruits. No CVA tenderness. Genitourinary:  Musculoskeletal: No lower extremity tenderness nor edema.  No joint effusions. Neurologic:  Normal speech and language. No gross focal neurologic deficits are appreciated. No facial droop Skin:  Skin is warm, dry and intact. No rash noted. Psychiatric: Mood and affect are normal. Speech and behavior are normal.  ____________________________________________   LABS (all labs ordered are listed, but only abnormal results are displayed)  Results for orders placed or performed during the hospital encounter of 07/02/17 (from the past 24 hour(s))  CBC with Differential/Platelet     Status: Abnormal   Collection Time: 07/02/17 11:18 AM  Result Value Ref Range   WBC 15.2 (H) 3.6 - 11.0 K/uL   RBC 5.33 (H) 3.80 - 5.20 MIL/uL   Hemoglobin 14.4 12.0 - 16.0 g/dL   HCT 05.3 97.6 - 73.4 %   MCV 83.1 80.0 - 100.0 fL   MCH 27.0 26.0 - 34.0 pg  MCHC 32.5 32.0 - 36.0 g/dL   RDW 16.1 (H) 09.6 - 04.5 %   Platelets 218 150 - 440 K/uL   Neutrophils Relative % 84 %   Lymphocytes Relative 7 %   Monocytes Relative 9 %   Eosinophils Relative 0 %   Basophils Relative 0 %   Neutro Abs 12.7 (H) 1.4 - 6.5 K/uL   Lymphs Abs 1.1 1.0 - 3.6 K/uL   Monocytes Absolute 1.4 (H) 0.2 - 0.9 K/uL   Eosinophils Absolute 0.0 0 - 0.7 K/uL   Basophils Absolute 0.0 0 - 0.1 K/uL  Comprehensive metabolic panel     Status: Abnormal   Collection Time: 07/02/17 11:18 AM  Result Value Ref Range   Sodium 134 (L) 135 - 145 mmol/L   Potassium 4.5 3.5 - 5.1 mmol/L   Chloride 97 (L) 101 - 111 mmol/L   CO2 24 22 - 32 mmol/L   Glucose, Bld 117 (H) 65 - 99 mg/dL   BUN 18 6 - 20 mg/dL   Creatinine, Ser 4.09 0.44 - 1.00 mg/dL   Calcium 8.5 (L) 8.9 - 10.3 mg/dL   Total Protein 7.5 6.5 - 8.1 g/dL   Albumin 3.6 3.5 - 5.0 g/dL   AST 20 15 - 41 U/L   ALT 21 14 - 54 U/L   Alkaline Phosphatase 90 38 - 126 U/L    Total Bilirubin 1.0 0.3 - 1.2 mg/dL   GFR calc non Af Amer >60 >60 mL/min   GFR calc Af Amer >60 >60 mL/min   Anion gap 13 5 - 15  Lactic acid, plasma     Status: None   Collection Time: 07/02/17 11:18 AM  Result Value Ref Range   Lactic Acid, Venous 1.9 0.5 - 1.9 mmol/L  Troponin I     Status: None   Collection Time: 07/02/17 11:18 AM  Result Value Ref Range   Troponin I <0.03 <0.03 ng/mL  Brain natriuretic peptide     Status: Abnormal   Collection Time: 07/02/17 11:18 AM  Result Value Ref Range   B Natriuretic Peptide 252.0 (H) 0.0 - 100.0 pg/mL   ____________________________________________  EKG My review and personal interpretation at Time: 11:13   Indication: weakness  Rate: 75  Rhythm: sinus Axis: normal Other: normal intervals,hyperacute t waves, no stemi ____________________________________________  RADIOLOGY  I personally reviewed all radiographic images ordered to evaluate for the above acute complaints and reviewed radiology reports and findings.  These findings were personally discussed with the patient.  Please see medical record for radiology report.  ____________________________________________   PROCEDURES  Procedure(s) performed:  Procedures    Critical Care performed: no ____________________________________________   INITIAL IMPRESSION / ASSESSMENT AND PLAN / ED COURSE  Pertinent labs & imaging results that were available during my care of the patient were reviewed by me and considered in my medical decision making (see chart for details).  DDX: Asthma, copd, CHF, pna, ptx, malignancy,, anemia   Crystal Todd is a 84 y.o. who presents to the ED with report of weakness and shortness of breath.  Patient nontoxic-appearing.  Mild tachypnea as described above.  And symptoms concerning for COPD exacerbation.  Will give additional nebulizer treatment.  Clinical Course as of Jul 02 1525  Wed Jul 02, 2017  1300 Patient reassessed feels much  improved after nebulizer treatments.  We will turn down oxygen and reassess.   [PR]  1427 Tolerating oral hydration and food.  No respiratory distress on her chronic home  oxygen.  Does have some mild tachypnea but she is speaking in complete sentences.  Documentation on vital signs seems to be slightly inflate is observation appears that she is breathing closer to 20 times a minute and not 29.  States that she feels significantly better after nebulizer treatment.  Spoke with nursing facility states patient would need a prescription for inhalers and has not been getting them despite her known diagnosis of COPD and reportedly being discharged home on nebulizers.  Did discuss hospitalization with the patient states that she feels well enough that she would like to go home.  Perception will be written.  Discussed signs symptoms which she should return to the hospital.   [PR]    Clinical Course User Index [PR] Willy Eddy, MD     As part of my medical decision making, I reviewed the following data within the electronic MEDICAL RECORD NUMBER Nursing notes reviewed and incorporated, Labs reviewed, notes from prior ED visits and [place] Controlled Substance Database   ____________________________________________   FINAL CLINICAL IMPRESSION(S) / ED DIAGNOSES  Final diagnoses:  Weakness  COPD exacerbation (HCC)      NEW MEDICATIONS STARTED DURING THIS VISIT:  New Prescriptions   ALBUTEROL (PROVENTIL HFA;VENTOLIN HFA) 108 (90 BASE) MCG/ACT INHALER    Inhale 2 puffs into the lungs every 6 (six) hours as needed for wheezing or shortness of breath.   LEVOFLOXACIN (LEVAQUIN) 250 MG TABLET    Take 1 tablet (250 mg total) by mouth daily for 7 days.     Note:  This document was prepared using Dragon voice recognition software and may include unintentional dictation errors.    Willy Eddy, MD 07/02/17 1431    Willy Eddy, MD 07/02/17 [phone]

## 2017-07-02 NOTE — ED Triage Notes (Signed)
Pt to ED from the [facility] with reports of generalized weakness that worsened today. Pt was seen in hospital on Monday and tested positive for influenza. Today pt presents with SOB and increased WOB. Pt on 3L [place] with EMS to maintain an oxygen saturation of 91%. Pt was on RA at baseline before hospital visit on Monday. Pt is 86% on RA upon arrival to ED.   Fine crackles in bilateral lower lobes.

## 2017-07-02 NOTE — ED Notes (Signed)
Changed pts brief. Giving pts sips of water.

## 2017-07-02 NOTE — ED Notes (Signed)
Pt able to keep down crackers and water. Call light within reach. Will continue to monitor for changes.

## 2017-07-02 NOTE — ED Notes (Signed)
Contacted the [employer] and they are coming to get the pt - Diplomatic Services operational officer notified to cancel ems

## 2017-07-02 NOTE — ED Notes (Signed)
Secretary notified to call ems for transport back to "[facility] [address]"

## 2017-07-02 NOTE — Discharge Instructions (Addendum)
Be sure to take 2 breaths with the inhaler every 4-6 hours while awake for the next 3 days and then every 4-6 hours as needed.  Take antibiotics once daily.  Return for worsening symptoms.

## 2017-07-03 ENCOUNTER — Inpatient Hospital Stay
Admission: EM | Admit: 2017-07-03 | Discharge: 2017-08-06 | DRG: 871 | Disposition: E | Payer: Medicare Other | Attending: Internal Medicine | Admitting: Internal Medicine

## 2017-07-03 ENCOUNTER — Emergency Department: Payer: Medicare Other

## 2017-07-03 ENCOUNTER — Encounter: Payer: Self-pay | Admitting: *Deleted

## 2017-07-03 ENCOUNTER — Other Ambulatory Visit: Payer: Self-pay

## 2017-07-03 DIAGNOSIS — A419 Sepsis, unspecified organism: Secondary | ICD-10-CM | POA: Diagnosis present

## 2017-07-03 DIAGNOSIS — R7989 Other specified abnormal findings of blood chemistry: Secondary | ICD-10-CM

## 2017-07-03 DIAGNOSIS — T380X5A Adverse effect of glucocorticoids and synthetic analogues, initial encounter: Secondary | ICD-10-CM | POA: Diagnosis present

## 2017-07-03 DIAGNOSIS — R6521 Severe sepsis with septic shock: Secondary | ICD-10-CM | POA: Diagnosis present

## 2017-07-03 DIAGNOSIS — M19042 Primary osteoarthritis, left hand: Secondary | ICD-10-CM | POA: Diagnosis present

## 2017-07-03 DIAGNOSIS — J441 Chronic obstructive pulmonary disease with (acute) exacerbation: Secondary | ICD-10-CM | POA: Diagnosis present

## 2017-07-03 DIAGNOSIS — Z87891 Personal history of nicotine dependence: Secondary | ICD-10-CM

## 2017-07-03 DIAGNOSIS — R1312 Dysphagia, oropharyngeal phase: Secondary | ICD-10-CM | POA: Diagnosis present

## 2017-07-03 DIAGNOSIS — F419 Anxiety disorder, unspecified: Secondary | ICD-10-CM | POA: Diagnosis present

## 2017-07-03 DIAGNOSIS — M810 Age-related osteoporosis without current pathological fracture: Secondary | ICD-10-CM | POA: Diagnosis present

## 2017-07-03 DIAGNOSIS — Z96641 Presence of right artificial hip joint: Secondary | ICD-10-CM | POA: Diagnosis present

## 2017-07-03 DIAGNOSIS — I5033 Acute on chronic diastolic (congestive) heart failure: Secondary | ICD-10-CM | POA: Diagnosis present

## 2017-07-03 DIAGNOSIS — I248 Other forms of acute ischemic heart disease: Secondary | ICD-10-CM | POA: Diagnosis present

## 2017-07-03 DIAGNOSIS — I340 Nonrheumatic mitral (valve) insufficiency: Secondary | ICD-10-CM | POA: Diagnosis not present

## 2017-07-03 DIAGNOSIS — J9622 Acute and chronic respiratory failure with hypercapnia: Secondary | ICD-10-CM

## 2017-07-03 DIAGNOSIS — J80 Acute respiratory distress syndrome: Secondary | ICD-10-CM | POA: Diagnosis present

## 2017-07-03 DIAGNOSIS — D649 Anemia, unspecified: Secondary | ICD-10-CM | POA: Diagnosis present

## 2017-07-03 DIAGNOSIS — G92 Toxic encephalopathy: Secondary | ICD-10-CM | POA: Diagnosis present

## 2017-07-03 DIAGNOSIS — E876 Hypokalemia: Secondary | ICD-10-CM | POA: Diagnosis not present

## 2017-07-03 DIAGNOSIS — Z9981 Dependence on supplemental oxygen: Secondary | ICD-10-CM | POA: Diagnosis not present

## 2017-07-03 DIAGNOSIS — R739 Hyperglycemia, unspecified: Secondary | ICD-10-CM | POA: Diagnosis present

## 2017-07-03 DIAGNOSIS — Z515 Encounter for palliative care: Secondary | ICD-10-CM | POA: Diagnosis not present

## 2017-07-03 DIAGNOSIS — J1000 Influenza due to other identified influenza virus with unspecified type of pneumonia: Secondary | ICD-10-CM | POA: Diagnosis present

## 2017-07-03 DIAGNOSIS — G9341 Metabolic encephalopathy: Secondary | ICD-10-CM | POA: Diagnosis not present

## 2017-07-03 DIAGNOSIS — M19041 Primary osteoarthritis, right hand: Secondary | ICD-10-CM | POA: Diagnosis present

## 2017-07-03 DIAGNOSIS — J101 Influenza due to other identified influenza virus with other respiratory manifestations: Secondary | ICD-10-CM | POA: Diagnosis present

## 2017-07-03 DIAGNOSIS — I2721 Secondary pulmonary arterial hypertension: Secondary | ICD-10-CM | POA: Diagnosis present

## 2017-07-03 DIAGNOSIS — Z9842 Cataract extraction status, left eye: Secondary | ICD-10-CM

## 2017-07-03 DIAGNOSIS — Z7951 Long term (current) use of inhaled steroids: Secondary | ICD-10-CM

## 2017-07-03 DIAGNOSIS — Z681 Body mass index (BMI) 19 or less, adult: Secondary | ICD-10-CM

## 2017-07-03 DIAGNOSIS — Z961 Presence of intraocular lens: Secondary | ICD-10-CM | POA: Diagnosis present

## 2017-07-03 DIAGNOSIS — T17800A Unspecified foreign body in other parts of respiratory tract causing asphyxiation, initial encounter: Secondary | ICD-10-CM

## 2017-07-03 DIAGNOSIS — I480 Paroxysmal atrial fibrillation: Secondary | ICD-10-CM | POA: Diagnosis present

## 2017-07-03 DIAGNOSIS — J690 Pneumonitis due to inhalation of food and vomit: Secondary | ICD-10-CM | POA: Diagnosis present

## 2017-07-03 DIAGNOSIS — I110 Hypertensive heart disease with heart failure: Secondary | ICD-10-CM | POA: Diagnosis present

## 2017-07-03 DIAGNOSIS — E785 Hyperlipidemia, unspecified: Secondary | ICD-10-CM | POA: Diagnosis present

## 2017-07-03 DIAGNOSIS — E440 Moderate protein-calorie malnutrition: Secondary | ICD-10-CM

## 2017-07-03 DIAGNOSIS — E860 Dehydration: Secondary | ICD-10-CM | POA: Diagnosis present

## 2017-07-03 DIAGNOSIS — Z79899 Other long term (current) drug therapy: Secondary | ICD-10-CM

## 2017-07-03 DIAGNOSIS — R778 Other specified abnormalities of plasma proteins: Secondary | ICD-10-CM

## 2017-07-03 DIAGNOSIS — I272 Pulmonary hypertension, unspecified: Secondary | ICD-10-CM | POA: Diagnosis present

## 2017-07-03 DIAGNOSIS — K219 Gastro-esophageal reflux disease without esophagitis: Secondary | ICD-10-CM | POA: Diagnosis present

## 2017-07-03 DIAGNOSIS — I4891 Unspecified atrial fibrillation: Secondary | ICD-10-CM | POA: Diagnosis not present

## 2017-07-03 DIAGNOSIS — J189 Pneumonia, unspecified organism: Secondary | ICD-10-CM

## 2017-07-03 DIAGNOSIS — I5031 Acute diastolic (congestive) heart failure: Secondary | ICD-10-CM | POA: Diagnosis not present

## 2017-07-03 DIAGNOSIS — Z887 Allergy status to serum and vaccine status: Secondary | ICD-10-CM

## 2017-07-03 DIAGNOSIS — M170 Bilateral primary osteoarthritis of knee: Secondary | ICD-10-CM | POA: Diagnosis present

## 2017-07-03 DIAGNOSIS — R1310 Dysphagia, unspecified: Secondary | ICD-10-CM | POA: Diagnosis present

## 2017-07-03 DIAGNOSIS — I509 Heart failure, unspecified: Secondary | ICD-10-CM

## 2017-07-03 DIAGNOSIS — J9600 Acute respiratory failure, unspecified whether with hypoxia or hypercapnia: Secondary | ICD-10-CM

## 2017-07-03 DIAGNOSIS — Z4659 Encounter for fitting and adjustment of other gastrointestinal appliance and device: Secondary | ICD-10-CM

## 2017-07-03 DIAGNOSIS — Z888 Allergy status to other drugs, medicaments and biological substances status: Secondary | ICD-10-CM

## 2017-07-03 DIAGNOSIS — Z66 Do not resuscitate: Secondary | ICD-10-CM | POA: Diagnosis present

## 2017-07-03 DIAGNOSIS — J9601 Acute respiratory failure with hypoxia: Secondary | ICD-10-CM | POA: Diagnosis not present

## 2017-07-03 DIAGNOSIS — L8990 Pressure ulcer of unspecified site, unspecified stage: Secondary | ICD-10-CM

## 2017-07-03 DIAGNOSIS — R0602 Shortness of breath: Secondary | ICD-10-CM | POA: Diagnosis present

## 2017-07-03 DIAGNOSIS — J9621 Acute and chronic respiratory failure with hypoxia: Secondary | ICD-10-CM | POA: Diagnosis not present

## 2017-07-03 DIAGNOSIS — Z7189 Other specified counseling: Secondary | ICD-10-CM | POA: Diagnosis not present

## 2017-07-03 DIAGNOSIS — I482 Chronic atrial fibrillation: Secondary | ICD-10-CM | POA: Diagnosis present

## 2017-07-03 DIAGNOSIS — I361 Nonrheumatic tricuspid (valve) insufficiency: Secondary | ICD-10-CM | POA: Diagnosis not present

## 2017-07-03 DIAGNOSIS — E162 Hypoglycemia, unspecified: Secondary | ICD-10-CM | POA: Diagnosis not present

## 2017-07-03 DIAGNOSIS — W449XXA Unspecified foreign body entering into or through a natural orifice, initial encounter: Secondary | ICD-10-CM

## 2017-07-03 LAB — TROPONIN I: Troponin I: 0.06 ng/mL (ref <0.03)

## 2017-07-03 LAB — COMPREHENSIVE METABOLIC PANEL
ALK PHOS: 68 U/L (ref 38–126)
ALT: 19 U/L (ref 14–54)
ANION GAP: 13 (ref 5–15)
AST: 21 U/L (ref 15–41)
Albumin: 2.8 g/dL — ABNORMAL LOW (ref 3.5–5.0)
BILIRUBIN TOTAL: 1 mg/dL (ref 0.3–1.2)
BUN: 23 mg/dL — ABNORMAL HIGH (ref 6–20)
CALCIUM: 7.8 mg/dL — AB (ref 8.9–10.3)
CO2: 22 mmol/L (ref 22–32)
Chloride: 101 mmol/L (ref 101–111)
Creatinine, Ser: 0.93 mg/dL (ref 0.44–1.00)
GFR calc Af Amer: >60 mL/min (ref >60)
GFR, EST NON AFRICAN AMERICAN: 54 mL/min — AB (ref >60)
Glucose, Bld: 161 mg/dL — ABNORMAL HIGH (ref 65–99)
Potassium: 4.5 mmol/L (ref 3.5–5.1)
Sodium: 136 mmol/L (ref 135–145)
TOTAL PROTEIN: 6.3 g/dL — AB (ref 6.5–8.1)

## 2017-07-03 LAB — CBC WITH DIFFERENTIAL/PLATELET
Basophils Absolute: 0 10*3/uL (ref 0–0.1)
Basophils Relative: 0 %
EOS ABS: 0 10*3/uL (ref 0–0.7)
Eosinophils Relative: 0 %
HCT: 38.6 % (ref 35.0–47.0)
HEMOGLOBIN: 12.9 g/dL (ref 12.0–16.0)
LYMPHS ABS: 1.2 10*3/uL (ref 1.0–3.6)
LYMPHS PCT: 7 %
MCH: 27.8 pg (ref 26.0–34.0)
MCHC: 33.3 g/dL (ref 32.0–36.0)
MCV: 83.3 fL (ref 80.0–100.0)
MONOS PCT: 5 %
Monocytes Absolute: 0.8 10*3/uL (ref 0.2–0.9)
NEUTROS PCT: 88 %
Neutro Abs: 14.4 10*3/uL — ABNORMAL HIGH (ref 1.4–6.5)
Platelets: 230 10*3/uL (ref 150–440)
RBC: 4.64 MIL/uL (ref 3.80–5.20)
RDW: 16.1 % — ABNORMAL HIGH (ref 11.5–14.5)
WBC: 16.4 10*3/uL — ABNORMAL HIGH (ref 3.6–11.0)

## 2017-07-03 LAB — GLUCOSE, CAPILLARY: Glucose-Capillary: 205 mg/dL — ABNORMAL HIGH (ref 65–99)

## 2017-07-03 LAB — LACTIC ACID, PLASMA
LACTIC ACID, VENOUS: 1.7 mmol/L (ref 0.5–1.9)
LACTIC ACID, VENOUS: 1.6 mmol/L (ref 0.5–1.9)

## 2017-07-03 MED ORDER — ALUM & MAG HYDROXIDE-SIMETH 200-200-20 MG/5ML PO SUSP
30.0000 mL | Freq: Four times a day (QID) | ORAL | Status: DC | PRN
  Filled 2017-07-03: qty 30

## 2017-07-03 MED ORDER — PAROXETINE HCL 20 MG PO TABS
20.0000 mg | ORAL_TABLET | Freq: Every day | ORAL | Status: DC
  Administered 2017-07-04 – 2017-07-12 (×8): 20 mg via ORAL
  Filled 2017-07-03 (×9): qty 1

## 2017-07-03 MED ORDER — ACETAMINOPHEN 325 MG PO TABS: 650.0000 mg | ORAL_TABLET | Freq: Four times a day (QID) | ORAL | Status: DC | PRN

## 2017-07-03 MED ORDER — MAGNESIUM SULFATE 2 GM/50ML IV SOLN
2.0000 g | Freq: Once | INTRAVENOUS | Status: AC
  Administered 2017-07-03: 2 g via INTRAVENOUS

## 2017-07-03 MED ORDER — GUAIFENESIN 100 MG/5ML PO SOLN
300.0000 mg | Freq: Four times a day (QID) | ORAL | Status: DC | PRN
  Filled 2017-07-03: qty 15

## 2017-07-03 MED ORDER — METHYLPREDNISOLONE SODIUM SUCC 40 MG IJ SOLR
40.0000 mg | Freq: Two times a day (BID) | INTRAMUSCULAR | Status: DC
  Administered 2017-07-03 – 2017-07-05 (×4): 40 mg via INTRAVENOUS
  Filled 2017-07-03 (×4): qty 1

## 2017-07-03 MED ORDER — CHLORHEXIDINE GLUCONATE 0.12 % MT SOLN: 15.0000 mL | Freq: Two times a day (BID) | OROMUCOSAL | Status: DC

## 2017-07-03 MED ORDER — METHYLPREDNISOLONE SODIUM SUCC 125 MG IJ SOLR
60.0000 mg | Freq: Once | INTRAMUSCULAR | Status: AC
  Administered 2017-07-03: 60 mg via INTRAVENOUS
  Filled 2017-07-03: qty 2

## 2017-07-03 MED ORDER — ONDANSETRON HCL 4 MG/2ML IJ SOLN
INTRAMUSCULAR | Status: AC
  Administered 2017-07-03: 4 mg
  Filled 2017-07-03: qty 2

## 2017-07-03 MED ORDER — SODIUM CHLORIDE 0.9 % IV BOLUS
500.0000 mL | Freq: Once | INTRAVENOUS | Status: AC
  Administered 2017-07-03: 500 mL via INTRAVENOUS

## 2017-07-03 MED ORDER — VANCOMYCIN HCL IN DEXTROSE 1-5 GM/200ML-% IV SOLN
1000.0000 mg | Freq: Once | INTRAVENOUS | Status: AC
  Administered 2017-07-03: 1000 mg via INTRAVENOUS
  Filled 2017-07-03: qty 200

## 2017-07-03 MED ORDER — SODIUM CHLORIDE 0.9 % IV BOLUS
250.0000 mL | Freq: Once | INTRAVENOUS | Status: AC
  Administered 2017-07-03: 250 mL via INTRAVENOUS

## 2017-07-03 MED ORDER — IPRATROPIUM-ALBUTEROL 0.5-2.5 (3) MG/3ML IN SOLN
3.0000 mL | Freq: Once | RESPIRATORY_TRACT | Status: AC
  Administered 2017-07-03: 3 mL via RESPIRATORY_TRACT
  Filled 2017-07-03: qty 3

## 2017-07-03 MED ORDER — HYDROCODONE-ACETAMINOPHEN 5-325 MG PO TABS: 1.0000 | ORAL_TABLET | ORAL | Status: DC | PRN

## 2017-07-03 MED ORDER — ONDANSETRON HCL 4 MG/2ML IJ SOLN
4.0000 mg | Freq: Four times a day (QID) | INTRAMUSCULAR | Status: DC | PRN
  Administered 2017-07-06: 4 mg via INTRAVENOUS
  Filled 2017-07-03: qty 2

## 2017-07-03 MED ORDER — DILTIAZEM HCL 100 MG IV SOLR
5.0000 mg/h | Freq: Once | INTRAVENOUS | Status: AC
  Administered 2017-07-03: 5 mg/h via INTRAVENOUS
  Filled 2017-07-03: qty 100

## 2017-07-03 MED ORDER — MAGNESIUM HYDROXIDE 400 MG/5ML PO SUSP: 30.0000 mL | Freq: Every day | ORAL | Status: DC | PRN

## 2017-07-03 MED ORDER — MAGNESIUM SULFATE 2 GM/50ML IV SOLN
INTRAVENOUS | Status: AC
  Administered 2017-07-03: 2 g via INTRAVENOUS
  Filled 2017-07-03: qty 50

## 2017-07-03 MED ORDER — SODIUM CHLORIDE 0.9 % IV SOLN
2.0000 g | Freq: Once | INTRAVENOUS | Status: AC
  Administered 2017-07-03: 2 g via INTRAVENOUS
  Filled 2017-07-03: qty 2

## 2017-07-03 MED ORDER — ALBUTEROL SULFATE (2.5 MG/3ML) 0.083% IN NEBU: 2.5000 mg | INHALATION_SOLUTION | RESPIRATORY_TRACT | Status: DC | PRN

## 2017-07-03 MED ORDER — KETOROLAC TROMETHAMINE 15 MG/ML IJ SOLN: 15.0000 mg | Freq: Four times a day (QID) | INTRAMUSCULAR | Status: AC | PRN

## 2017-07-03 MED ORDER — ACETAMINOPHEN 650 MG RE SUPP: 650.0000 mg | Freq: Four times a day (QID) | RECTAL | Status: DC | PRN

## 2017-07-03 MED ORDER — BISACODYL 5 MG PO TBEC: 5.0000 mg | DELAYED_RELEASE_TABLET | Freq: Every day | ORAL | Status: DC | PRN

## 2017-07-03 MED ORDER — PANTOPRAZOLE SODIUM 40 MG PO TBEC
40.0000 mg | DELAYED_RELEASE_TABLET | Freq: Every day | ORAL | Status: DC
  Administered 2017-07-04 – 2017-07-07 (×4): 40 mg via ORAL
  Filled 2017-07-03 (×4): qty 1

## 2017-07-03 MED ORDER — DIPHENHYDRAMINE HCL 25 MG PO TABS
25.0000 mg | ORAL_TABLET | Freq: Four times a day (QID) | ORAL | Status: DC | PRN
  Filled 2017-07-03: qty 1

## 2017-07-03 MED ORDER — ENOXAPARIN SODIUM 40 MG/0.4ML ~~LOC~~ SOLN: 40.0000 mg | SUBCUTANEOUS | Status: DC

## 2017-07-03 MED ORDER — DEXTROSE 5 % IV SOLN: 5.0000 mg/h | Freq: Once | INTRAVENOUS | Status: DC

## 2017-07-03 MED ORDER — ALBUTEROL SULFATE (5 MG/ML) 0.5% IN NEBU: 2.5000 mg | INHALATION_SOLUTION | Freq: Four times a day (QID) | RESPIRATORY_TRACT | Status: DC

## 2017-07-03 MED ORDER — ONDANSETRON HCL 4 MG PO TABS: 4.0000 mg | ORAL_TABLET | Freq: Four times a day (QID) | ORAL | Status: DC | PRN

## 2017-07-03 MED ORDER — SODIUM CHLORIDE 0.9 % IV SOLN
2.0000 g | Freq: Two times a day (BID) | INTRAVENOUS | Status: DC
  Administered 2017-07-04 – 2017-07-06 (×6): 2 g via INTRAVENOUS
  Filled 2017-07-03 (×7): qty 2

## 2017-07-03 MED ORDER — DIVALPROEX SODIUM 125 MG PO CSDR
125.0000 mg | DELAYED_RELEASE_CAPSULE | Freq: Every morning | ORAL | Status: DC
  Administered 2017-07-04 – 2017-07-07 (×4): 125 mg via ORAL
  Filled 2017-07-03 (×4): qty 1

## 2017-07-03 MED ORDER — SODIUM CHLORIDE 0.9 % IV SOLN
INTRAVENOUS | Status: DC
  Administered 2017-07-03: 23:00:00 via INTRAVENOUS

## 2017-07-03 MED ORDER — SENNOSIDES-DOCUSATE SODIUM 8.6-50 MG PO TABS: 1.0000 | ORAL_TABLET | Freq: Every evening | ORAL | Status: DC | PRN

## 2017-07-03 MED ORDER — VANCOMYCIN HCL IN DEXTROSE 750-5 MG/150ML-% IV SOLN
750.0000 mg | INTRAVENOUS | Status: DC
  Filled 2017-07-03: qty 150

## 2017-07-03 MED ORDER — IPRATROPIUM-ALBUTEROL 0.5-2.5 (3) MG/3ML IN SOLN
3.0000 mL | Freq: Once | RESPIRATORY_TRACT | Status: AC
Start: 2017-07-03 — End: 2017-07-03
  Administered 2017-07-03: 3 mL via RESPIRATORY_TRACT
  Filled 2017-07-03: qty 3

## 2017-07-03 MED ORDER — HEPARIN SODIUM (PORCINE) 5000 UNIT/ML IJ SOLN
5000.0000 [IU] | Freq: Three times a day (TID) | INTRAMUSCULAR | Status: DC
  Administered 2017-07-04 – 2017-07-05 (×6): 5000 [IU] via SUBCUTANEOUS
  Filled 2017-07-03 (×6): qty 1

## 2017-07-03 MED ORDER — DIVALPROEX SODIUM 125 MG PO CSDR
250.0000 mg | DELAYED_RELEASE_CAPSULE | Freq: Every day | ORAL | Status: DC
  Administered 2017-07-04 – 2017-07-06 (×3): 250 mg via ORAL
  Filled 2017-07-03 (×5): qty 2

## 2017-07-03 MED ORDER — DIVALPROEX SODIUM 125 MG PO CSDR: 125.0000 mg | DELAYED_RELEASE_CAPSULE | Freq: Two times a day (BID) | ORAL | Status: DC

## 2017-07-03 MED ORDER — ORAL CARE MOUTH RINSE: 15.0000 mL | Freq: Two times a day (BID) | OROMUCOSAL | Status: DC

## 2017-07-03 NOTE — Consult Note (Signed)
Name: Crystal Todd MRN: [id number] DOB: April 09, 1927    ADMISSION DATE:  Jul 13, 2017 CONSULTATION DATE: July 13, 2017  REFERRING MD : Dr. Elpidio Anis  CHIEF COMPLAINT: Shortness of Breath   BRIEF PATIENT DESCRIPTION:  58 F from nursing home recent hosp for influenza and pneumonia now presenting with hypotension and acute on chronic hypoxic respiratory failure secondary to pneumonia and AECOPD requiring Bipap  SIGNIFICANT EVENTS  03/28-Pt admitted to stepdown unit   STUDIES:  None  HISTORY OF PRESENT ILLNESS:   This is a 84 yo female with a PMH of Paroxysmal Atrial Fibrillation, Osteoporosis, HTN, Hypercholesteremia, GERD, COPD, Chronic Home O2 @2L , Asthma, and Arthritis.  She presented to [facility] ER from nursing facility on 03/28 with c/o worsening right sided chest pain and shortness of breath.  The pt was seen and treated in the ER on 03/27 with mild COPD exacerbation, however she did not require admission at that time ER physician instructed nursing facility to administer scheduled nebulizer treatment for 48 hrs.  However, per ER notes the pt did not receive any nebulized treatments or inhalers at nursing facility. During current ER visit pt in respiratory distress upon arrival, therefore she was placed on Bipap.  CXR revealed possible airspace disease on the left with vascular congestion. It was also noted pt hypotensive with leukocytosis, therefore pt received iv abx and iv fluids.  She was subsequently admitted to the stepdown unit by hospitalist team for further workup and treatment.   PAST MEDICAL HISTORY :   has a past medical history of Arthritis, Asthma, COPD (chronic obstructive pulmonary disease) (HCC), Dysrhythmia, Full dentures, GERD (gastroesophageal reflux disease), Hypercholesteremia, Hypertension, Osteoporosis, Paroxysmal atrial fibrillation (HCC), and Shortness of breath dyspnea.  has a past surgical history that includes Back surgery; Esophagogastroduodenoscopy (egd) with  esophageal dilation; Joint replacement; Wrist fracture surgery (Bilateral); and Cataract extraction w/PHACO (Left, 10/12/2014). Prior to Admission medications   Medication Sig Start Date End Date Taking? Authorizing Provider  acetaminophen (TYLENOL) 325 MG tablet Take 650 mg by mouth every 4 (four) hours as needed for fever (up to 101).   Yes [provider]  albuterol (PROVENTIL HFA;VENTOLIN HFA) 108 (90 Base) MCG/ACT inhaler Inhale 2 puffs into the lungs every 6 (six) hours as needed for wheezing or shortness of breath. 07/02/17  Yes Willy Eddy, MD  alum & mag hydroxide-simeth (ANTACID) 200-200-20 MG/5ML suspension Take 30 mLs by mouth every 6 (six) hours as needed for indigestion or heartburn.   Yes [provider]  amLODipine (NORVASC) 5 MG tablet Take 5 mg by mouth daily.   Yes [provider]  cholecalciferol (VITAMIN D) 1000 units tablet Take 1,000 Units by mouth daily.   Yes [provider]  diphenhydrAMINE (BENADRYL) 25 MG tablet Take 25 mg by mouth every 6 (six) hours as needed for allergies.   Yes [provider]  divalproex (DEPAKOTE SPRINKLE) 125 MG capsule Take 125-250 mg by mouth 2 (two) times daily. 125 mg every morning and 250 mg at bedtime   Yes [provider]  fluticasone (FLONASE) 50 MCG/ACT nasal spray Place 2 sprays into the nose 2 (two) times daily.   Yes [provider]  guaiFENesin (ROBITUSSIN) 100 MG/5ML liquid Take 300 mg by mouth every 6 (six) hours as needed for cough.   Yes [provider]  ipratropium-albuterol (DUONEB) 0.5-2.5 (3) MG/3ML SOLN Take 3 mLs by nebulization every 6 (six) hours as needed (SOB or wheezing). Patient taking differently: Take 3 mLs by nebulization every 6 (six)  hours as needed. For wheezing or shortness of breath 06/30/17  Yes Sudini, Wardell Heath, MD  loperamide (IMODIUM A-D) 2 MG capsule Take 4 mg by mouth as needed for diarrhea or loose stools.   Yes [provider]    magnesium hydroxide (MILK OF MAGNESIA) 400 MG/5ML suspension Take 30 mLs by mouth daily as needed for mild constipation.   Yes [provider]  metoprolol tartrate (LOPRESSOR) 25 MG tablet Take 12.5 mg by mouth 2 (two) times daily.   Yes [provider]  multivitamin (PROSIGHT) TABS tablet Take 1 tablet by mouth daily.   Yes [provider]  omeprazole (PRILOSEC) 20 MG capsule Take 20 mg by mouth daily.   Yes [provider]  oseltamivir (TAMIFLU) 30 MG capsule Take 1 capsule (30 mg total) by mouth 2 (two) times daily. 06/30/17  Yes Sudini, Wardell Heath, MD  PARoxetine (PAXIL) 20 MG tablet Take 20 mg by mouth daily.   Yes [provider]  predniSONE (DELTASONE) 10 MG tablet Take 10 mg by mouth 4 (four) times daily. For 2 days, then 3 times daily for 2 days, then 2 times daily for 2 days, then daily for 2 days, then stop 07/01/17  Yes [provider]  vitamin B-12 (CYANOCOBALAMIN) 1000 MCG tablet Take 1,000 mcg by mouth daily.   Yes [provider]  levofloxacin (LEVAQUIN) 250 MG tablet Take 1 tablet (250 mg total) by mouth daily for 7 days. Patient not taking: Reported on 06/22/2017 07/02/17 07/09/17  Willy Eddy, MD   Allergies  Allergen Reactions  . Ace Inhibitors     Other reaction(s): Other (See Comments) Other Reaction: Other reaction  . Alendronate     Other reaction(s): Other (See Comments) Other Reaction: Other reaction  . Influenza Vaccines Other (See Comments)    "kept the flu for a year"    FAMILY HISTORY:  family history is not on file. SOCIAL HISTORY:  reports that she quit smoking about 43 years ago. She has never used smokeless tobacco. She reports that she does not drink alcohol.  REVIEW OF SYSTEMS: Positives in BOLD  Constitutional: Negative for fever, chills, weight loss, malaise/fatigue and diaphoresis.  HENT: Negative for hearing loss, ear pain, nosebleeds, congestion, sore throat, neck pain, tinnitus and ear  discharge.   Eyes: Negative for blurred vision, double vision, photophobia, pain, discharge and redness.  Respiratory: cough, hemoptysis, sputum production, shortness of breath, wheezing and stridor.   Cardiovascular: right sided chest pain, palpitations, orthopnea, claudication, leg swelling and PND.  Gastrointestinal: Negative for heartburn, nausea, vomiting, abdominal pain, diarrhea, constipation, blood in stool and melena.  Genitourinary: Negative for dysuria, urgency, frequency, hematuria and flank pain.  Musculoskeletal: Negative for myalgias, back pain, joint pain and falls.  Skin: Negative for itching and rash.  Neurological: Negative for dizziness, tingling, tremors, sensory change, speech change, focal weakness, seizures, loss of consciousness, weakness and headaches.  Endo/Heme/Allergies: Negative for environmental allergies and polydipsia. Does not bruise/bleed easily.  SUBJECTIVE:  No complaints at this time   VITAL SIGNS: Temp:  [98.5 F (36.9 C)-100.1 F (37.8 C)] 98.5 F (36.9 C) (03/28 2300) Pulse Rate:  [27-160] 112 (03/28 2300) Resp:  [19-45] 19 (03/28 2300) BP: (76-116)/(49-93) 89/71 (03/28 2300) SpO2:  [90 %-98 %] 91 % (03/28 2300) FiO2 (%):  [30 %-60 %] 60 % (03/28 2300) Weight:  [48.4 kg (106 lb 11.2 oz)] 48.4 kg (106 lb 11.2 oz) (03/28 2310)  PHYSICAL EXAMINATION: General: well developed frail female, NAD on Bipap  Neuro: alert and oriented, follows commands  HEENT: supple, no JVD Cardiovascular: irregular irregular, no R/G Lungs: diminished throughout, even, non labored Abdomen: +BS x4, soft, non tender, non distended  Musculoskeletal: normal bulk and tone, no edema Skin: intact no rashes or lesions   Recent Labs  Lab 06/27/17 0613 07/02/17 1118 06/30/2017 2017  NA 140 134* 136  K 4.4 4.5 4.5  CL 107 97* 101  CO2 22 24 22   BUN 12 18 23*  CREATININE 0.62 0.75 0.93  GLUCOSE 143* 117* 161*   Recent Labs  Lab 06/27/17 0613 07/02/17 1118  07/01/2017 2017  HGB 11.5* 14.4 12.9  HCT 35.8 44.3 38.6  WBC 5.0 15.2* 16.4*  PLT 136* 218 230   Dg Chest 2 View  Result Date: 07/02/2017 CLINICAL DATA:  Generalized weakness. Increasing shortness of breath today. Patient was diagnosed with influenza 06/30/2017. EXAM: CHEST - 2 VIEW COMPARISON:  Single-view of the chest. PA and lateral 06/26/2017 chest 06/17/2013. FINDINGS: There is eventration of the left hemidiaphragm. Heart size is upper normal there is vascular congestion. No consolidative process, pneumothorax or pleural fluid. Linear atelectasis left lung base noted. Aortic atherosclerosis is noted. No acute bony abnormality. Remote left humerus fracture and thoracic spine fractures noted. IMPRESSION: Cardiomegaly and vascular congestion. Left basilar atelectasis. Atherosclerosis. Electronically Signed   By: 08/17/2013 M.D.   On: 07/02/2017 12:15   Dg Chest Portable 1 View  Result Date: 07/02/2017 CLINICAL DATA:  Weakness EXAM: PORTABLE CHEST 1 VIEW COMPARISON:  07/02/2017, 06/26/2017, 06/18/2011 FINDINGS: Limited by habitus and positioning enlarged cardiomediastinal silhouette with vascular congestion. Possible increased airspace disease at the left base. No obvious pneumothorax. Proximal left humerus fracture again noted IMPRESSION: Limited by habitus and positioning. Possible increased airspace disease at the left base. Cardiomegaly with vascular congestion Electronically Signed   By: 08/18/2011 M.D.   On: 06/09/2017 18:50    ASSESSMENT / PLAN: Acute on chronic hypoxic respiratory failure secondary to pneumonia and AECOPD  Chronic Paroxysmal Atrial Fibrillation  Hypotension concerning for possible sepsis  Mildly elevated troponin likely secondary to demand ischemia in setting of respiratory failure  Hx: GERD, Asthma, HTN, and Hypercholesteremia  P: Prn Bipap for dyspnea and/or hypoxia  Scheduled and prn bronchodilator therapy  IV and nebulized steroids Repeat CXR in am   Trend WBC and monitor fever curve  Trend PCT  Follow cultures  Continue current abx  Trend troponin's Maintain map >65 Trend BMP Replace electrolytes as indicated  Monitor UOP  VTE px: Subq heparin  Trend CBC Monitor for s/sx of bleeding  Transfuse for hgb <7  11-28-1992, AGNP  Pulmonary/Critical Care Pager [phone] (please enter 7 digits) PCCM Consult Pager [phone] (please enter 7 digits)

## 2017-07-03 NOTE — H&P (Signed)
[facility] [place] [facility]   PATIENT NAME: Crystal Todd    MR#:  [id number]  DATE OF BIRTH:  1927-04-24  DATE OF ADMISSION:  Jul 28, 2017  PRIMARY CARE PHYSICIAN: Patient, No Pcp Per   REQUESTING/REFERRING PHYSICIAN: Dr. Roxan Hockey.  CHIEF COMPLAINT:   Chief Complaint  Patient presents with  . Shortness of Breath   Worsening shortness of breath for 4 days. HISTORY OF PRESENT ILLNESS:  Crystal Todd  is a 84 y.o. female with a known history of recent influenza A, hypertension, paroxysmal A. fib, COPD, hyperlipidemia and arthritis.  The patient was sent from skilled nursing facility due to above chief complaints.  The patient also complains of denies weakness, fever and chills.  She was found hypotension with blood pressures at 80s, tachycardia, hypoxia and leukocytosis.  Chest x-ray show left-sided pneumonia.  She is put on BiPAP, given antibiotics and normal saline bolus. PAST MEDICAL HISTORY:   Past Medical History:  Diagnosis Date  . Arthritis    hands, knees  . Asthma   . COPD (chronic obstructive pulmonary disease) (HCC)   . Dysrhythmia    A-Fib  . Full dentures    upper and lower  . GERD (gastroesophageal reflux disease)   . Hypercholesteremia   . Hypertension   . Osteoporosis   . Paroxysmal atrial fibrillation (HCC)   . Shortness of breath dyspnea     PAST SURGICAL HISTORY:   Past Surgical History:  Procedure Laterality Date  . BACK SURGERY     "cemented" discs  . CATARACT EXTRACTION W/PHACO Left 10/12/2014   Procedure: CATARACT EXTRACTION PHACO AND INTRAOCULAR LENS PLACEMENT (IOC);  Surgeon: Lockie Mola, MD;  Location: [facility];  Service: Ophthalmology;  Laterality: Left;  . ESOPHAGOGASTRODUODENOSCOPY (EGD) WITH ESOPHAGEAL DILATION    . JOINT REPLACEMENT     hip  . WRIST FRACTURE SURGERY Bilateral     SOCIAL HISTORY:   Social History   Tobacco Use  . Smoking status: Former Smoker    Last attempt to  quit: 04/08/1974    Years since quitting: 43.2  . Smokeless tobacco: Never Used  Substance Use Topics  . Alcohol use: No    FAMILY HISTORY:  No family history on file.  DRUG ALLERGIES:   Allergies  Allergen Reactions  . Ace Inhibitors     Other reaction(s): Other (See Comments) Other Reaction: Other reaction  . Alendronate     Other reaction(s): Other (See Comments) Other Reaction: Other reaction  . Influenza Vaccines Other (See Comments)    "kept the flu for a year"    REVIEW OF SYSTEMS:   Review of Systems  Constitutional: Positive for chills, fever and malaise/fatigue.  HENT: Negative for sore throat.   Eyes: Negative for blurred vision and double vision.  Respiratory: Positive for cough, shortness of breath and wheezing. Negative for hemoptysis and stridor.   Cardiovascular: Negative for palpitations, orthopnea and leg swelling.  Gastrointestinal: Negative for abdominal pain, blood in stool, diarrhea, melena, nausea and vomiting.  Genitourinary: Negative for dysuria, flank pain and hematuria.  Musculoskeletal: Negative for back pain and joint pain.  Skin: Negative for itching.  Neurological: Positive for dizziness and headaches. Negative for sensory change, focal weakness, seizures, loss of consciousness and weakness.  Endo/Heme/Allergies: Negative for polydipsia.  Psychiatric/Behavioral: Negative for depression. The patient is not nervous/anxious.     MEDICATIONS AT HOME:   Prior to Admission medications   Medication Sig Start Date End Date Taking? Authorizing Provider  acetaminophen (  TYLENOL) 325 MG tablet Take 650 mg by mouth every 4 (four) hours as needed for fever (up to 101).   Yes [provider]  albuterol (PROVENTIL HFA;VENTOLIN HFA) 108 (90 Base) MCG/ACT inhaler Inhale 2 puffs into the lungs every 6 (six) hours as needed for wheezing or shortness of breath. 07/02/17  Yes Willy Eddy, MD  alum & mag hydroxide-simeth (ANTACID) 200-200-20 MG/5ML  suspension Take 30 mLs by mouth every 6 (six) hours as needed for indigestion or heartburn.   Yes [provider]  amLODipine (NORVASC) 5 MG tablet Take 5 mg by mouth daily.   Yes [provider]  cholecalciferol (VITAMIN D) 1000 units tablet Take 1,000 Units by mouth daily.   Yes [provider]  diphenhydrAMINE (BENADRYL) 25 MG tablet Take 25 mg by mouth every 6 (six) hours as needed for allergies.   Yes [provider]  divalproex (DEPAKOTE SPRINKLE) 125 MG capsule Take 125-250 mg by mouth 2 (two) times daily. 125 mg every morning and 250 mg at bedtime   Yes [provider]  fluticasone (FLONASE) 50 MCG/ACT nasal spray Place 2 sprays into the nose 2 (two) times daily.   Yes [provider]  guaiFENesin (ROBITUSSIN) 100 MG/5ML liquid Take 300 mg by mouth every 6 (six) hours as needed for cough.   Yes [provider]  ipratropium-albuterol (DUONEB) 0.5-2.5 (3) MG/3ML SOLN Take 3 mLs by nebulization every 6 (six) hours as needed (SOB or wheezing). Patient taking differently: Take 3 mLs by nebulization every 6 (six) hours as needed. For wheezing or shortness of breath 06/30/17  Yes Sudini, Wardell Heath, MD  loperamide (IMODIUM A-D) 2 MG capsule Take 4 mg by mouth as needed for diarrhea or loose stools.   Yes [provider]  magnesium hydroxide (MILK OF MAGNESIA) 400 MG/5ML suspension Take 30 mLs by mouth daily as needed for mild constipation.   Yes [provider]  metoprolol tartrate (LOPRESSOR) 25 MG tablet Take 12.5 mg by mouth 2 (two) times daily.   Yes [provider]  multivitamin (PROSIGHT) TABS tablet Take 1 tablet by mouth daily.   Yes [provider]  omeprazole (PRILOSEC) 20 MG capsule Take 20 mg by mouth daily.   Yes [provider]  oseltamivir (TAMIFLU) 30 MG capsule Take 1 capsule (30 mg total) by mouth 2 (two) times daily. 06/30/17  Yes Sudini, Wardell Heath, MD  PARoxetine (PAXIL) 20 MG tablet  Take 20 mg by mouth daily.   Yes [provider]  predniSONE (DELTASONE) 10 MG tablet Take 10 mg by mouth 4 (four) times daily. For 2 days, then 3 times daily for 2 days, then 2 times daily for 2 days, then daily for 2 days, then stop 07/01/17  Yes [provider]  vitamin B-12 (CYANOCOBALAMIN) 1000 MCG tablet Take 1,000 mcg by mouth daily.   Yes [provider]  levofloxacin (LEVAQUIN) 250 MG tablet Take 1 tablet (250 mg total) by mouth daily for 7 days. Patient not taking: Reported on 2017/07/20 07/02/17 07/09/17  Willy Eddy, MD      VITAL SIGNS:  Blood pressure (!) 82/62, pulse (!) 116, temperature 100.1 F (37.8 C), temperature source Oral, resp. rate (!) 23, SpO2 98 %.  PHYSICAL EXAMINATION:  Physical Exam  GENERAL:  84 y.o.-year-old patient lying in the bed with no acute distress.  On BiPAP. EYES: Pupils equal, round, reactive to light and accommodation. No scleral icterus. Extraocular muscles intact.  HEENT: Head atraumatic, normocephalic. Oropharynx and nasopharynx  clear.  Dry oral mucosa. NECK:  Supple, no jugular venous distention. No thyroid enlargement, no tenderness.  LUNGS: Normal breath sounds bilaterally, no wheezing, rales,rhonchi or crepitation. No use of accessory muscles of respiration.  CARDIOVASCULAR: S1, S2 normal. No murmurs, rubs, or gallops.  ABDOMEN: Soft, nontender, nondistended. Bowel sounds present. No organomegaly or mass.  EXTREMITIES: No pedal edema, cyanosis, or clubbing.  Scoliosis. NEUROLOGIC: Cranial nerves II through XII are intact. Muscle strength 5/5 in all extremities. Sensation intact. Gait not checked.  PSYCHIATRIC: The patient is alert and oriented x 3.  SKIN: No obvious rash, lesion, or ulcer.   LABORATORY PANEL:   CBC Recent Labs  Lab 06/06/2017 2017  WBC 16.4*  HGB 12.9  HCT 38.6  PLT 230    ------------------------------------------------------------------------------------------------------------------  Chemistries  Recent Labs  Lab 06/20/2017 2017  NA 136  K 4.5  CL 101  CO2 22  GLUCOSE 161*  BUN 23*  CREATININE 0.93  CALCIUM 7.8*  AST 21  ALT 19  ALKPHOS 68  BILITOT 1.0   ------------------------------------------------------------------------------------------------------------------  Cardiac Enzymes Recent Labs  Lab 07/05/2017 2017  TROPONINI 0.06*   ------------------------------------------------------------------------------------------------------------------  RADIOLOGY:  Dg Chest 2 View  Result Date: 07/02/2017 CLINICAL DATA:  Generalized weakness. Increasing shortness of breath today. Patient was diagnosed with influenza 06/30/2017. EXAM: CHEST - 2 VIEW COMPARISON:  Single-view of the chest. PA and lateral 06/26/2017 chest 06/17/2013. FINDINGS: There is eventration of the left hemidiaphragm. Heart size is upper normal there is vascular congestion. No consolidative process, pneumothorax or pleural fluid. Linear atelectasis left lung base noted. Aortic atherosclerosis is noted. No acute bony abnormality. Remote left humerus fracture and thoracic spine fractures noted. IMPRESSION: Cardiomegaly and vascular congestion. Left basilar atelectasis. Atherosclerosis. Electronically Signed   By: Drusilla Kanner M.D.   On: 07/02/2017 12:15   Dg Chest Portable 1 View  Result Date: 06/20/2017 CLINICAL DATA:  Weakness EXAM: PORTABLE CHEST 1 VIEW COMPARISON:  07/02/2017, 06/26/2017, 06/18/2011 FINDINGS: Limited by habitus and positioning enlarged cardiomediastinal silhouette with vascular congestion. Possible increased airspace disease at the left base. No obvious pneumothorax. Proximal left humerus fracture again noted IMPRESSION: Limited by habitus and positioning. Possible increased airspace disease at the left base. Cardiomegaly with vascular congestion  Electronically Signed   By: Jasmine Pang M.D.   On: 06/23/2017 18:50      IMPRESSION AND PLAN:   Acute respiratory failure with hypoxia due to sepsis and pneumonia. The patient will be admitted to stepdown unit. Continue BiPAP, start cefepime and vancomycin, follow-up CBC, blood culture and sputum culture if possible.  Septic shock due to pneumonia. The patient was treated with normal saline bolus, blood pressure is still in the low side, continue IV fluids support.  COPD exacerbation.  Xopenex every 6 hours and IV Solu-Medrol.  Elevated troponin due to demanding ischemia.  Dehydration.  Continue IV fluid support.  I discussed with Elink intensivist. All the records are reviewed and case discussed with ED provider. Management plans discussed with the patient, family and they are in agreement.  CODE STATUS: DNR.  TOTAL CRITICAL TIME TAKING CARE OF THIS PATIENT: 60 minutes.    Shaune Pollack M.D on 06/22/2017 at 9:36 PM  Between 7am to 6pm - Pager - [phone]  After 6pm go to [email or web address] - Social research officer, government  [facility] [place] [facility]  Office  [phone]  CC: Primary care physician; Patient, No Pcp Per   Note: This dictation was prepared with Dragon dictation along with smaller phrase technology. Any transcriptional  errors that result from this process are unin

## 2017-07-03 NOTE — Progress Notes (Signed)
CODE SEPSIS - PHARMACY COMMUNICATION  **Broad Spectrum Antibiotics should be administered within 1 hour of Sepsis diagnosis**  Time Code Sepsis Called/Page Received: 1846  Antibiotics Ordered: cefepime/vancomycin  Time of 1st antibiotic administration:   Additional action taken by pharmacy:   If necessary, Name of Provider/Nurse Contacted:   Patient will transfer back to [facility]  Valentina Gu ,PharmD Clinical Pharmacist  Jul 17, 2017  6:55 PM

## 2017-07-03 NOTE — Progress Notes (Signed)
Transported pt to ICU 7 on Bipap without incident. Pt remains on BIPAP and is tol well at this time. Report given to ICU RT.

## 2017-07-03 NOTE — Progress Notes (Deleted)
CODE SEPSIS - PHARMACY COMMUNICATION  **Broad Spectrum Antibiotics should be administered within 1 hour of Sepsis diagnosis**  Time Code Sepsis Called/Page Received: none  Antibiotics Ordered: vanc/cefepime  Time of 1st antibiotic administration: 2022  Additional action taken by pharmacy:   If necessary, Name of Provider/Nurse Contacted:     Thomasene Ripple ,PharmD Clinical Pharmacist  06/27/2017  10:18 PM

## 2017-07-03 NOTE — Progress Notes (Signed)
ANTIBIOTIC CONSULT NOTE - INITIAL  Pharmacy Consult for Vancomycin, Cefepime  Indication: pneumonia  Allergies  Allergen Reactions  . Ace Inhibitors     Other reaction(s): Other (See Comments) Other Reaction: Other reaction  . Alendronate     Other reaction(s): Other (See Comments) Other Reaction: Other reaction  . Influenza Vaccines Other (See Comments)    "kept the flu for a year"    Patient Measurements:   Adjusted Body Weight: 52.58 kg   Vital Signs: Temp: 100.1 F (37.8 C) (03/28 1828) Temp Source: Oral (03/28 1828) BP: 82/62 (03/28 2115) Pulse Rate: 116 (03/28 2130) Intake/Output from previous day: No intake/output data recorded. Intake/Output from this shift: No intake/output data recorded.  Labs: Recent Labs    07/02/17 1118 2017/07/21 2017  WBC 15.2* 16.4*  HGB 14.4 12.9  PLT 218 230  CREATININE 0.75 0.93   Estimated Creatinine Clearance: 33.2 mL/min (by C-G formula based on SCr of 0.93 mg/dL). No results for input(s): VANCOTROUGH, VANCOPEAK, VANCORANDOM, GENTTROUGH, GENTPEAK, GENTRANDOM, TOBRATROUGH, TOBRAPEAK, TOBRARND, AMIKACINPEAK, AMIKACINTROU, AMIKACIN in the last 72 hours.   Microbiology: Recent Results (from the past 720 hour(s))  Blood culture (routine x 2)     Status: None   Collection Time: 06/26/17  5:36 PM  Result Value Ref Range Status   Specimen Description BLOOD RFOA  Final   Special Requests   Final    BOTTLES DRAWN AEROBIC AND ANAEROBIC Blood Culture adequate volume   Culture   Final    NO GROWTH 5 DAYS Performed at [facility], [address] [place], [place] [address]    Report Status 07/01/2017 FINAL  Final  Blood culture (routine x 2)     Status: None   Collection Time: 06/26/17  5:36 PM  Result Value Ref Range Status   Specimen Description BLOOD LEFT WRIST  Final   Special Requests   Final    BOTTLES DRAWN AEROBIC AND ANAEROBIC Blood Culture adequate volume   Culture   Final    NO GROWTH 5 DAYS Performed at  [facility], [address] [place], [place] [address]    Report Status 07/01/2017 FINAL  Final  MRSA PCR Screening     Status: None   Collection Time: 06/28/17  5:10 AM  Result Value Ref Range Status   MRSA by PCR NEGATIVE NEGATIVE Final    Comment:        The GeneXpert MRSA Assay (FDA approved for NASAL specimens only), is one component of a comprehensive MRSA colonization surveillance program. It is not intended to diagnose MRSA infection nor to guide or monitor treatment for MRSA infections. Performed at [facility], [address], [place], [place] [address]     Medical History: Past Medical History:  Diagnosis Date  . Arthritis    hands, knees  . Asthma   . COPD (chronic obstructive pulmonary disease) (HCC)   . Dysrhythmia    A-Fib  . Full dentures    upper and lower  . GERD (gastroesophageal reflux disease)   . Hypercholesteremia   . Hypertension   . Osteoporosis   . Paroxysmal atrial fibrillation (HCC)   . Shortness of breath dyspnea     Medications:   (Not in a hospital admission) Assessment: CrCl = 33.2 ml/min  ke = 0.032 hr-1 Vd = 34.6 L  T1/2 = 21.7 hrs   Goal of Therapy:  Vancomycin trough level 15-20 mcg/ml  Plan:  Expected duration 7 days with resolution of temperature and/or normalization  of WBC   Cefepime 2 gm IV X 1 given on 3/28 @ 20:20. Cefepime 2 gm IV Q12H ordered to start on 3/29 @ 0800.   Vancomycin 1 gm IV X 1 given on 3/28 @ 21:00. Vancomycin 750 mg IV Q24H ordered to start on 3/29 @ 13:00, ~ 16 hrs after 1st dose (stacked dosing). This pt will reach Css on 4/2 @ 0900 . Will draw 1st trough on 4/1 @ 12:30 , which will be approaching Css.   Cain Fitzhenry D 07/18/2017,10:00 PM

## 2017-07-03 NOTE — ED Provider Notes (Signed)
[facility] Emergency Department Provider Note    First MD Initiated Contact with Patient 07/27/17 1804     (approximate)  I have reviewed the triage vital signs and the nursing notes.   HISTORY  Chief Complaint Shortness of Breath    HPI Crystal Todd is a 84 y.o. female with a history of COPD as well as A. fib with recent diagnosis of flu and treatment for pneumonia with chronic O2 requirement of 2 L nasal cannula presents with worsening right-sided chest pain as well as worsening shortness of breath.  I saw the patient yesterday and it appeared that she was having mild COPD exacerbation with no new hypoxia or significant increased work of breathing.  Spoke with her nursing home who was understanding of the plan to have scheduled nebulizer treatments for the next 48 hours.  She went home and states that she has not had any inhalers or nebulizer treatments since being back at the facility despite me writing a prescription for this and documenting those instructions in her discharge paperwork.  She is very pleasant states that she does feel more short of breath than previous.  States that her pain is mild and worse with movement.  Past Medical History:  Diagnosis Date  . Arthritis    hands, knees  . Asthma   . COPD (chronic obstructive pulmonary disease) (HCC)   . Dysrhythmia    A-Fib  . Full dentures    upper and lower  . GERD (gastroesophageal reflux disease)   . Hypercholesteremia   . Hypertension   . Osteoporosis   . Paroxysmal atrial fibrillation (HCC)   . Shortness of breath dyspnea    No family history on file. Past Surgical History:  Procedure Laterality Date  . BACK SURGERY     "cemented" discs  . CATARACT EXTRACTION W/PHACO Left 10/12/2014   Procedure: CATARACT EXTRACTION PHACO AND INTRAOCULAR LENS PLACEMENT (IOC);  Surgeon: Lockie Mola, MD;  Location: [facility];  Service: Ophthalmology;  Laterality: Left;  .  ESOPHAGOGASTRODUODENOSCOPY (EGD) WITH ESOPHAGEAL DILATION    . JOINT REPLACEMENT     hip  . WRIST FRACTURE SURGERY Bilateral    Patient Active Problem List   Diagnosis Date Noted  . PNA (pneumonia) 06/26/2017      Prior to Admission medications   Medication Sig Start Date End Date Taking? Authorizing Provider  acetaminophen (TYLENOL) 325 MG tablet Take 650 mg by mouth every 4 (four) hours as needed for fever (up to 101).   Yes [provider]  albuterol (PROVENTIL HFA;VENTOLIN HFA) 108 (90 Base) MCG/ACT inhaler Inhale 2 puffs into the lungs every 6 (six) hours as needed for wheezing or shortness of breath. 07/02/17  Yes Willy Eddy, MD  alum & mag hydroxide-simeth (ANTACID) 200-200-20 MG/5ML suspension Take 30 mLs by mouth every 6 (six) hours as needed for indigestion or heartburn.   Yes [provider]  amLODipine (NORVASC) 5 MG tablet Take 5 mg by mouth daily.   Yes [provider]  cholecalciferol (VITAMIN D) 1000 units tablet Take 1,000 Units by mouth daily.   Yes [provider]  diphenhydrAMINE (BENADRYL) 25 MG tablet Take 25 mg by mouth every 6 (six) hours as needed for allergies.   Yes [provider]  divalproex (DEPAKOTE SPRINKLE) 125 MG capsule Take 125-250 mg by mouth 2 (two) times daily. 125 mg every morning and 250 mg at bedtime   Yes [provider]  fluticasone (FLONASE) 50 MCG/ACT nasal  spray Place 2 sprays into the nose 2 (two) times daily.   Yes [provider]  guaiFENesin (ROBITUSSIN) 100 MG/5ML liquid Take 300 mg by mouth every 6 (six) hours as needed for cough.   Yes [provider]  ipratropium-albuterol (DUONEB) 0.5-2.5 (3) MG/3ML SOLN Take 3 mLs by nebulization every 6 (six) hours as needed (SOB or wheezing). Patient taking differently: Take 3 mLs by nebulization every 6 (six) hours as needed. For wheezing or shortness of breath 06/30/17  Yes Sudini, Wardell Heath, MD  loperamide (IMODIUM A-D) 2 MG  capsule Take 4 mg by mouth as needed for diarrhea or loose stools.   Yes [provider]  magnesium hydroxide (MILK OF MAGNESIA) 400 MG/5ML suspension Take 30 mLs by mouth daily as needed for mild constipation.   Yes [provider]  metoprolol tartrate (LOPRESSOR) 25 MG tablet Take 12.5 mg by mouth 2 (two) times daily.   Yes [provider]  multivitamin (PROSIGHT) TABS tablet Take 1 tablet by mouth daily.   Yes [provider]  omeprazole (PRILOSEC) 20 MG capsule Take 20 mg by mouth daily.   Yes [provider]  oseltamivir (TAMIFLU) 30 MG capsule Take 1 capsule (30 mg total) by mouth 2 (two) times daily. 06/30/17  Yes Sudini, Wardell Heath, MD  PARoxetine (PAXIL) 20 MG tablet Take 20 mg by mouth daily.   Yes [provider]  predniSONE (DELTASONE) 10 MG tablet Take 10 mg by mouth 4 (four) times daily. For 2 days, then 3 times daily for 2 days, then 2 times daily for 2 days, then daily for 2 days, then stop 07/01/17  Yes [provider]  vitamin B-12 (CYANOCOBALAMIN) 1000 MCG tablet Take 1,000 mcg by mouth daily.   Yes [provider]  levofloxacin (LEVAQUIN) 250 MG tablet Take 1 tablet (250 mg total) by mouth daily for 7 days. Patient not taking: Reported on 07/14/2017 07/02/17 07/09/17  Willy Eddy, MD    Allergies Ace inhibitors; Alendronate; and Influenza vaccines    Social History Social History   Tobacco Use  . Smoking status: Former Smoker    Last attempt to quit: 04/08/1974    Years since quitting: 43.2  . Smokeless tobacco: Never Used  Substance Use Topics  . Alcohol use: No  . Drug use: Not on file    Review of Systems Patient denies headaches, rhinorrhea, blurry vision, numbness, shortness of breath, chest pain, edema, cough, abdominal pain, nausea, vomiting, diarrhea, dysuria, fevers, rashes or hallucinations unless otherwise stated above in HPI. ____________________________________________   PHYSICAL  EXAM:  VITAL SIGNS: Vitals:   July 14, 2017 2000 07/14/2017 2015  BP: (!) 76/62 93/67  Pulse:  (!) 107  Resp: (!) 26 (!) 26  Temp:    SpO2:  95%    Constitutional: Alert and oriented. ill appearing in mild respiratory distress. Eyes: Conjunctivae are normal.  Head: Atraumatic. Nose: No congestion/rhinnorhea. Mouth/Throat: Mucous membranes are moist.   Neck: No stridor. Painless ROM.  Cardiovascular: Normal rate, regular rhythm. Grossly normal heart sounds.  Good peripheral circulation. Respiratory: Marked tachypnea and use of accessory muscles. Gastrointestinal: Soft and nontender. No distention. No abdominal bruits. No CVA tenderness. Genitourinary:  Musculoskeletal: No lower extremity tenderness nor edema.  No joint effusions. Neurologic:  Normal speech and language. No gross focal neurologic deficits are appreciated. No facial droop Skin:  Skin is warm, dry and intact. No rash noted. Psychiatric: Mood and affect are normal. Speech and behavior are normal.  ____________________________________________   LABS (all  labs ordered are listed, but only abnormal results are displayed)  Results for orders placed or performed during the hospital encounter of Aug 01, 2017 (from the past 24 hour(s))  Lactic acid, plasma     Status: None   Collection Time: 01-Aug-2017  7:03 PM  Result Value Ref Range   Lactic Acid, Venous 1.7 0.5 - 1.9 mmol/L  Lactic acid, plasma     Status: None   Collection Time: 08-01-17  8:08 PM  Result Value Ref Range   Lactic Acid, Venous 1.6 0.5 - 1.9 mmol/L  CBC with Differential/Platelet     Status: Abnormal   Collection Time: 01-Aug-2017  8:17 PM  Result Value Ref Range   WBC 16.4 (H) 3.6 - 11.0 K/uL   RBC 4.64 3.80 - 5.20 MIL/uL   Hemoglobin 12.9 12.0 - 16.0 g/dL   HCT 00.8 67.6 - 19.5 %   MCV 83.3 80.0 - 100.0 fL   MCH 27.8 26.0 - 34.0 pg   MCHC 33.3 32.0 - 36.0 g/dL   RDW 09.3 (H) 26.7 - 12.4 %   Platelets 230 150 - 440 K/uL   Neutrophils Relative % 88 %    Neutro Abs 14.4 (H) 1.4 - 6.5 K/uL   Lymphocytes Relative 7 %   Lymphs Abs 1.2 1.0 - 3.6 K/uL   Monocytes Relative 5 %   Monocytes Absolute 0.8 0.2 - 0.9 K/uL   Eosinophils Relative 0 %   Eosinophils Absolute 0.0 0 - 0.7 K/uL   Basophils Relative 0 %   Basophils Absolute 0.0 0 - 0.1 K/uL  Comprehensive metabolic panel     Status: Abnormal   Collection Time: Aug 01, 2017  8:17 PM  Result Value Ref Range   Sodium 136 135 - 145 mmol/L   Potassium 4.5 3.5 - 5.1 mmol/L   Chloride 101 101 - 111 mmol/L   CO2 22 22 - 32 mmol/L   Glucose, Bld 161 (H) 65 - 99 mg/dL   BUN 23 (H) 6 - 20 mg/dL   Creatinine, Ser 5.80 0.44 - 1.00 mg/dL   Calcium 7.8 (L) 8.9 - 10.3 mg/dL   Total Protein 6.3 (L) 6.5 - 8.1 g/dL   Albumin 2.8 (L) 3.5 - 5.0 g/dL   AST 21 15 - 41 U/L   ALT 19 14 - 54 U/L   Alkaline Phosphatase 68 38 - 126 U/L   Total Bilirubin 1.0 0.3 - 1.2 mg/dL   GFR calc non Af Amer 54 (L) >60 mL/min   GFR calc Af Amer >60 >60 mL/min   Anion gap 13 5 - 15  Troponin I     Status: Abnormal   Collection Time: Aug 01, 2017  8:17 PM  Result Value Ref Range   Troponin I 0.06 (HH) <0.03 ng/mL   ____________________________________________  EKG My review and personal interpretation at Time: 18:11   Indication: sob  Rate: 160  Rhythm: afib with rvr Axis: normal Other: nonspecific st changes, likely rate depndent ____________________________________________  RADIOLOGY  I personally reviewed all radiographic images ordered to evaluate for the above acute complaints and reviewed radiology reports and findings.  These findings were personally discussed with the patient.  Please see medical record for radiology report.  ____________________________________________   PROCEDURES  Procedure(s) performed:  .Critical Care Performed by: Willy Eddy, MD Authorized by: Willy Eddy, MD   Critical care provider statement:    Critical care time (minutes):  35   Critical care time was exclusive of:   Separately billable procedures and treating other patients  Critical care was necessary to treat or prevent imminent or life-threatening deterioration of the following conditions:  Respiratory failure   Critical care was time spent personally by me on the following activities:  Development of treatment plan with patient or surrogate, discussions with consultants, evaluation of patient's response to treatment, examination of patient, obtaining history from patient or surrogate, ordering and performing treatments and interventions, ordering and review of laboratory studies, ordering and review of radiographic studies, pulse oximetry, re-evaluation of patient's condition and review of old charts      Critical Care performed: yes ____________________________________________   INITIAL IMPRESSION / ASSESSMENT AND PLAN / ED COURSE  Pertinent labs & imaging results that were available during my care of the patient were reviewed by me and considered in my medical decision making (see chart for details).  DDX: Asthma, copd, CHF, pna, ptx, malignancy, Pe, anemia   LOUGENIA CURLEE is a 84 y.o. who presents to the ED with was as described above.  Patient is ill-appearing.  Likely due to not getting enough nebulizers over the past 24hours.  Based on her frailty will order blood work as well as give additional nebulizers place the patient on BiPAP give IV steroids and reassess.  Noted to be in A. fib which she has as a history.  Will order Cardizem.  Timestamp.'s patient still in A. fib with RVR but heart rate is slowly improving.  Maps remained in the 70s.  Patient remains critically ill-appearing but reiterates that she is a DNR.  We will continue with nebulizer treatments.  Has received IV magnesium.  Has received broad-spectrum IV antibiotics for H CAP pneumonia.  Patient reassessed and remains in good spirits but demonstrates understanding of her poor prognosis at this time.  Have discussed with the  patient and available family all diagnostics and treatments performed thus far and all questions were answered to the best of my ability. The patient demonstrates understanding and agreement with plan.    As part of my medical decision making, I reviewed the following data within the electronic MEDICAL RECORD NUMBER Nursing notes reviewed and incorporated, Labs reviewed, notes from prior ED visits and [place] Controlled Substance Database   ____________________________________________   FINAL CLINICAL IMPRESSION(S) / ED DIAGNOSES  Final diagnoses:  Acute on chronic respiratory failure with hypoxia and hypercapnia (HCC)  Elevated troponin I level  Atrial fibrillation with RVR (HCC)      NEW MEDICATIONS STARTED DURING THIS VISIT:  New Prescriptions   No medications on file     Note:  This document was prepared using Dragon voice recognition software and may include unintentional dictation errors.    Willy Eddy, MD 06/11/2017 2118

## 2017-07-04 DIAGNOSIS — J9601 Acute respiratory failure with hypoxia: Secondary | ICD-10-CM

## 2017-07-04 LAB — BASIC METABOLIC PANEL
Anion gap: 10 (ref 5–15)
BUN: 28 mg/dL — AB (ref 6–20)
CHLORIDE: 104 mmol/L (ref 101–111)
CO2: 23 mmol/L (ref 22–32)
CREATININE: 0.71 mg/dL (ref 0.44–1.00)
Calcium: 7.8 mg/dL — ABNORMAL LOW (ref 8.9–10.3)
Glucose, Bld: 179 mg/dL — ABNORMAL HIGH (ref 65–99)
POTASSIUM: 4.7 mmol/L (ref 3.5–5.1)
SODIUM: 137 mmol/L (ref 135–145)

## 2017-07-04 LAB — CBC
HCT: 36.4 % (ref 35.0–47.0)
Hemoglobin: 11.6 g/dL — ABNORMAL LOW (ref 12.0–16.0)
MCH: 27 pg (ref 26.0–34.0)
MCHC: 32 g/dL (ref 32.0–36.0)
MCV: 84.4 fL (ref 80.0–100.0)
PLATELETS: 201 10*3/uL (ref 150–440)
RBC: 4.31 MIL/uL (ref 3.80–5.20)
RDW: 16.6 % — AB (ref 11.5–14.5)
WBC: 9.1 10*3/uL (ref 3.6–11.0)

## 2017-07-04 LAB — INFLUENZA PANEL BY PCR (TYPE A & B)
INFLBPCR: NEGATIVE
Influenza A By PCR: POSITIVE — AB

## 2017-07-04 LAB — MRSA PCR SCREENING: MRSA by PCR: NEGATIVE

## 2017-07-04 LAB — MAGNESIUM: Magnesium: 3 mg/dL — ABNORMAL HIGH (ref 1.7–2.4)

## 2017-07-04 LAB — PROTIME-INR
INR: 1.2
PROTHROMBIN TIME: 15.1 s (ref 11.4–15.2)

## 2017-07-04 LAB — PROCALCITONIN: Procalcitonin: <0.1 ng/mL

## 2017-07-04 LAB — APTT: aPTT: 32 seconds (ref 24–36)

## 2017-07-04 MED ORDER — LEVALBUTEROL HCL 0.63 MG/3ML IN NEBU
0.6300 mg | INHALATION_SOLUTION | Freq: Four times a day (QID) | RESPIRATORY_TRACT | Status: DC | PRN
  Administered 2017-07-06: 0.63 mg via RESPIRATORY_TRACT
  Filled 2017-07-04: qty 3

## 2017-07-04 MED ORDER — LEVALBUTEROL HCL 0.63 MG/3ML IN NEBU
0.6300 mg | INHALATION_SOLUTION | Freq: Four times a day (QID) | RESPIRATORY_TRACT | Status: DC
  Administered 2017-07-04 – 2017-07-12 (×33): 0.63 mg via RESPIRATORY_TRACT
  Filled 2017-07-04 (×34): qty 3

## 2017-07-04 MED ORDER — OSELTAMIVIR PHOSPHATE 30 MG PO CAPS
30.0000 mg | ORAL_CAPSULE | Freq: Two times a day (BID) | ORAL | Status: AC
  Administered 2017-07-04 – 2017-07-06 (×6): 30 mg via ORAL
  Filled 2017-07-04 (×6): qty 1

## 2017-07-04 MED ORDER — AMIODARONE LOAD VIA INFUSION
150.0000 mg | Freq: Once | INTRAVENOUS | Status: AC
  Administered 2017-07-04: 150 mg via INTRAVENOUS
  Filled 2017-07-04: qty 83.34

## 2017-07-04 MED ORDER — ORAL CARE MOUTH RINSE
15.0000 mL | Freq: Two times a day (BID) | OROMUCOSAL | Status: DC
  Administered 2017-07-04 – 2017-07-12 (×10): 15 mL via OROMUCOSAL

## 2017-07-04 MED ORDER — AMIODARONE HCL IN DEXTROSE 360-4.14 MG/200ML-% IV SOLN
60.0000 mg/h | INTRAVENOUS | Status: AC
  Administered 2017-07-04: 60 mg/h via INTRAVENOUS
  Filled 2017-07-04: qty 200

## 2017-07-04 MED ORDER — ASPIRIN EC 81 MG PO TBEC
81.0000 mg | DELAYED_RELEASE_TABLET | Freq: Every day | ORAL | Status: DC
  Administered 2017-07-04 – 2017-07-07 (×4): 81 mg via ORAL
  Filled 2017-07-04 (×4): qty 1

## 2017-07-04 MED ORDER — ENSURE ENLIVE PO LIQD
237.0000 mL | Freq: Two times a day (BID) | ORAL | Status: DC
  Administered 2017-07-05: 237 mL via ORAL

## 2017-07-04 MED ORDER — CHLORHEXIDINE GLUCONATE 0.12 % MT SOLN
15.0000 mL | Freq: Two times a day (BID) | OROMUCOSAL | Status: DC
  Administered 2017-07-04 – 2017-07-05 (×3): 15 mL via OROMUCOSAL
  Filled 2017-07-04 (×3): qty 15

## 2017-07-04 MED ORDER — AMIODARONE HCL IN DEXTROSE 360-4.14 MG/200ML-% IV SOLN
60.0000 mg/h | INTRAVENOUS | Status: DC
  Administered 2017-07-04 – 2017-07-06 (×7): 30 mg/h via INTRAVENOUS
  Filled 2017-07-04 (×8): qty 200

## 2017-07-04 MED ORDER — BUDESONIDE 0.25 MG/2ML IN SUSP
0.2500 mg | Freq: Two times a day (BID) | RESPIRATORY_TRACT | Status: DC
  Administered 2017-07-04 – 2017-07-12 (×18): 0.25 mg via RESPIRATORY_TRACT
  Filled 2017-07-04 (×18): qty 2

## 2017-07-04 NOTE — Progress Notes (Signed)
Name: Crystal Todd MRN: [id number] DOB: 1927/08/29     CONSULTATION DATE: July 13, 2017  Subjective; Feels better tolerating nasal cannula  Objective; Remains on amiodarone drip   HISTORY OF PRESENT ILLNESS:    PAST MEDICAL HISTORY :   has a past medical history of Arthritis, Asthma, COPD (chronic obstructive pulmonary disease) (HCC), Dysrhythmia, Full dentures, GERD (gastroesophageal reflux disease), Hypercholesteremia, Hypertension, Osteoporosis, Paroxysmal atrial fibrillation (HCC), and Shortness of breath dyspnea.  has a past surgical history that includes Back surgery; Esophagogastroduodenoscopy (egd) with esophageal dilation; Joint replacement; Wrist fracture surgery (Bilateral); and Cataract extraction w/PHACO (Left, 10/12/2014). Prior to Admission medications   Medication Sig Start Date End Date Taking? Authorizing Provider  acetaminophen (TYLENOL) 325 MG tablet Take 650 mg by mouth every 4 (four) hours as needed for fever (up to 101).   Yes [provider]  albuterol (PROVENTIL HFA;VENTOLIN HFA) 108 (90 Base) MCG/ACT inhaler Inhale 2 puffs into the lungs every 6 (six) hours as needed for wheezing or shortness of breath. 07/02/17  Yes Willy Eddy, MD  alum & mag hydroxide-simeth (ANTACID) 200-200-20 MG/5ML suspension Take 30 mLs by mouth every 6 (six) hours as needed for indigestion or heartburn.   Yes [provider]  amLODipine (NORVASC) 5 MG tablet Take 5 mg by mouth daily.   Yes [provider]  cholecalciferol (VITAMIN D) 1000 units tablet Take 1,000 Units by mouth daily.   Yes [provider]  diphenhydrAMINE (BENADRYL) 25 MG tablet Take 25 mg by mouth every 6 (six) hours as needed for allergies.   Yes [provider]  divalproex (DEPAKOTE SPRINKLE) 125 MG capsule Take 125-250 mg by mouth 2 (two) times daily. 125 mg every morning and 250 mg at bedtime   Yes [provider]  fluticasone (FLONASE) 50 MCG/ACT nasal  spray Place 2 sprays into the nose 2 (two) times daily.   Yes [provider]  guaiFENesin (ROBITUSSIN) 100 MG/5ML liquid Take 300 mg by mouth every 6 (six) hours as needed for cough.   Yes [provider]  ipratropium-albuterol (DUONEB) 0.5-2.5 (3) MG/3ML SOLN Take 3 mLs by nebulization every 6 (six) hours as needed (SOB or wheezing). Patient taking differently: Take 3 mLs by nebulization every 6 (six) hours as needed. For wheezing or shortness of breath 06/30/17  Yes Sudini, Wardell Heath, MD  loperamide (IMODIUM A-D) 2 MG capsule Take 4 mg by mouth as needed for diarrhea or loose stools.   Yes [provider]  magnesium hydroxide (MILK OF MAGNESIA) 400 MG/5ML suspension Take 30 mLs by mouth daily as needed for mild constipation.   Yes [provider]  metoprolol tartrate (LOPRESSOR) 25 MG tablet Take 12.5 mg by mouth 2 (two) times daily.   Yes [provider]  multivitamin (PROSIGHT) TABS tablet Take 1 tablet by mouth daily.   Yes [provider]  omeprazole (PRILOSEC) 20 MG capsule Take 20 mg by mouth daily.   Yes [provider]  oseltamivir (TAMIFLU) 30 MG capsule Take 1 capsule (30 mg total) by mouth 2 (two) times daily. 06/30/17  Yes Sudini, Wardell Heath, MD  PARoxetine (PAXIL) 20 MG tablet Take 20 mg by mouth daily.   Yes [provider]  predniSONE (DELTASONE) 10 MG tablet Take 10 mg by mouth 4 (four) times daily. For 2 days, then 3 times daily for 2 days, then 2 times daily for 2 days, then daily for 2 days, then stop 07/01/17  Yes [provider]  vitamin B-12 (CYANOCOBALAMIN) 1000  MCG tablet Take 1,000 mcg by mouth daily.   Yes [provider]  levofloxacin (LEVAQUIN) 250 MG tablet Take 1 tablet (250 mg total) by mouth daily for 7 days. Patient not taking: Reported on 2017/07/07 07/02/17 07/09/17  Willy Eddy, MD   Allergies  Allergen Reactions  . Ace Inhibitors     Other reaction(s): Other (See  Comments) Other Reaction: Other reaction  . Alendronate     Other reaction(s): Other (See Comments) Other Reaction: Other reaction  . Influenza Vaccines Other (See Comments)    "kept the flu for a year"    FAMILY HISTORY:  family history is not on file. SOCIAL HISTORY:  reports that she quit smoking about 43 years ago. She has never used smokeless tobacco. She reports that she does not drink alcohol.  REVIEW OF SYSTEMS:   Unable to obtain due to critical illness   VITAL SIGNS: Temp:  [97.5 F (36.4 C)-100.1 F (37.8 C)] 98.3 F (36.8 C) (03/29 1200) Pulse Rate:  [27-160] 89 (03/29 1600) Resp:  [15-45] 23 (03/29 1600) BP: (76-116)/(49-93) 86/67 (03/29 1600) SpO2:  [90 %-98 %] 94 % (03/29 1600) FiO2 (%):  [30 %-60 %] 45 % (03/29 1100) Weight:  [48.4 kg (106 lb 11.2 oz)] 48.4 kg (106 lb 11.2 oz) (03/28 2310)  Physical Examination:  Awake and oriented there is no acute neurological deficits Tolerating nasal cannula, no distress, able to talk in full sentences, BEAE and no rales S1 + S2 audible no murmur Abdomen was normal peristalsis List of the extremities no edema  ASSESSMENT / PLAN: Acute hypoxic respiratory failure on chronic respiratory failure baseline home O2 2 L/min -Improved and tolerating nasal cannula.  Monitor work of breathing and O2 sat CODE STATUS DNR  Pneumonia.  Improved airspace disease -Continue cefepime and DC vancomycin MRSA PCR negative -Monitor CXR + CBC + FiO2  Flu. -Continue Tamiflu for a total of 10 days of therapy.  Hypotension with septic shock [less likely with pro-calcitonin less than 0.1] and meds.  -Optimize medication, consider pressors if systolic blood pressure less than 90 and continue to monitor hemodynamics  COPD exacerbation -Bronchodilators and taper steroids.  Mild elevation of troponin due to demand versus supply mismatch -Monitor EKG, echocardiogram and cardiac enzymes. -Start on aspirin.  DNR  DVT & GI prophylaxis.   Continue with supportive care  Critical care time 35 minutes

## 2017-07-04 NOTE — NC FL2 (Signed)
[place] MEDICAID FL2 LEVEL OF CARE SCREENING TOOL     IDENTIFICATION  Patient Name: Crystal Todd Birthdate: 29-Jul-1927 Sex: female Admission Date (Current Location): 08-02-2017  [place] and [place] Number:  [address] [id number] [facility] Facility and Address:  [facility], [address], [place], [place] [address]      Provider Number: [id number]  Attending Physician Name and Address:  Milagros Loll, MD  Relative Name and Phone Number:  Concha Pyo Niece [phone]  [phone]     Current Level of Care: Hospital Recommended Level of Care: Assisted Living Facility Prior Approval Number:    Date Approved/Denied:   PASRR Number:    Discharge Plan: Other (Comment)([facility])    Current Diagnoses: Patient Active Problem List   Diagnosis Date Noted  . Acute respiratory failure with hypoxia (HCC) 08/02/2017  . PNA (pneumonia) 06/26/2017    Orientation RESPIRATION BLADDER Height & Weight     Self, Time, Situation, Place  O2(2 litres) Incontinent Weight: 106 lb 11.2 oz (48.4 kg) Height:  5\' 3"  (160 cm)  BEHAVIORAL SYMPTOMS/MOOD NEUROLOGICAL BOWEL NUTRITION STATUS      Continent Diet(regular)  AMBULATORY STATUS COMMUNICATION OF NEEDS Skin   Limited Assist Verbally Normal                       Personal Care Assistance Level of Assistance  Feeding, Bathing, Dressing Bathing Assistance: Limited assistance Feeding assistance: Independent Dressing Assistance: Limited assistance     Functional Limitations Info  Sight, Speech, Hearing Sight Info: Adequate Hearing Info: Adequate Speech Info: Adequate    SPECIAL CARE FACTORS FREQUENCY                       Contractures Contractures Info: Not present    Additional Factors Info  Code Status, Allergies, Psychotropic Code Status Info: Full code Allergies Info: Ace inhibitors,aldendronate,influenza vacines Psychotropic Info: paxil 20 mg Depakote Sprinkle 250 mg tabDivalproex  Depakote sprinkle 125         Current Medications (07/04/2017):  This is the current hospital active medication list Current Facility-Administered Medications  Medication Dose Route Frequency Provider Last Rate Last Dose  . 0.9 %  sodium chloride infusion   Intravenous Continuous 07/06/2017, NP 50 mL/hr at 07/04/17 0200    . acetaminophen (TYLENOL) tablet 650 mg  650 mg Oral Q6H PRN 07/06/17, MD       Or  . acetaminophen (TYLENOL) suppository 650 mg  650 mg Rectal Q6H PRN Shaune Pollack, MD      . alum & mag hydroxide-simeth (MAALOX/MYLANTA) 200-200-20 MG/5ML suspension 30 mL  30 mL Oral Q6H PRN 10-15-2000, MD      . amiodarone (NEXTERONE PREMIX) 360-4.14 MG/200ML-% (1.8 mg/mL) IV infusion  30 mg/hr Intravenous Continuous 08-01-1979, NP 16.7 mL/hr at 07/04/17 1123 30 mg/hr at 07/04/17 1123  . aspirin EC tablet 81 mg  81 mg Oral Daily Samaan, Maged, MD      . bisacodyl (DULCOLAX) EC tablet 5 mg  5 mg Oral Daily PRN 07/06/17, MD      . budesonide (PULMICORT) nebulizer solution 0.25 mg  0.25 mg Nebulization BID Shaune Pollack, NP   0.25 mg at 07/04/17 0743  . ceFEPIme (MAXIPIME) 2 g in sodium chloride 0.9 % 100 mL IVPB  2 g Intravenous Q12H 07/06/17, MD   Stopped at 07/04/17 [phone]  . chlorhexidine (PERIDEX) 0.12 % solution 15 mL  15 mL Mouth Rinse BID  Eugenie Norrie, NP   15 mL at 07/04/17 1131  . diphenhydrAMINE (BENADRYL) tablet 25 mg  25 mg Oral Q6H PRN Shaune Pollack, MD      . divalproex (DEPAKOTE SPRINKLE) capsule 125 mg  125 mg Oral q morning - 10a Milagros Loll, MD   125 mg at 07/04/17 1130   And  . divalproex (DEPAKOTE SPRINKLE) capsule 250 mg  250 mg Oral QHS Sudini, Wardell Heath, MD      . Melene Muller ON 07/05/2017] feeding supplement (ENSURE ENLIVE) (ENSURE ENLIVE) liquid 237 mL  237 mL Oral BID BM Sudini, Srikar, MD      . guaiFENesin (ROBITUSSIN) 100 MG/5ML solution 300 mg  300 mg Oral Q6H PRN Shaune Pollack, MD      . heparin injection 5,000 Units  5,000 Units Subcutaneous Q8H  Eugenie Norrie, NP   5,000 Units at 07/04/17 [phone]  . HYDROcodone-acetaminophen (NORCO/VICODIN) 5-325 MG per tablet 1-2 tablet  1-2 tablet Oral Q4H PRN Shaune Pollack, MD      . ketorolac (TORADOL) 15 MG/ML injection 15 mg  15 mg Intravenous Q6H PRN Shaune Pollack, MD      . levalbuterol Pauline Aus) nebulizer solution 0.63 mg  0.63 mg Nebulization Q6H Eugenie Norrie, NP   0.63 mg at 07/04/17 1420  . levalbuterol (XOPENEX) nebulizer solution 0.63 mg  0.63 mg Nebulization Q6H PRN Eugenie Norrie, NP      . magnesium hydroxide (MILK OF MAGNESIA) suspension 30 mL  30 mL Oral Daily PRN Shaune Pollack, MD      . MEDLINE mouth rinse  15 mL Mouth Rinse q12n4p Sudini, Srikar, MD      . MEDLINE mouth rinse  15 mL Mouth Rinse q12n4p Eugenie Norrie, NP   15 mL at 07/04/17 1225  . methylPREDNISolone sodium succinate (SOLU-MEDROL) 40 mg/mL injection 40 mg  40 mg Intravenous Q12H Shaune Pollack, MD   40 mg at 07/04/17 1131  . ondansetron (ZOFRAN) tablet 4 mg  4 mg Oral Q6H PRN Shaune Pollack, MD       Or  . ondansetron [facility]) injection 4 mg  4 mg Intravenous Q6H PRN Shaune Pollack, MD      . oseltamivir (TAMIFLU) capsule 30 mg  30 mg Oral BID Uvaldo Rising, MD   30 mg at 07/04/17 1221  . pantoprazole (PROTONIX) EC tablet 40 mg  40 mg Oral Daily Shaune Pollack, MD   40 mg at 07/04/17 1130  . PARoxetine (PAXIL) tablet 20 mg  20 mg Oral Daily Shaune Pollack, MD   20 mg at 07/04/17 1131  . senna-docusate (Senokot-S) tablet 1 tablet  1 tablet Oral QHS PRN Shaune Pollack, MD         Discharge Medications: Please see discharge summary for a list of discharge medications.  Relevant Imaging Results:  Relevant Lab Results:   Additional Information   [id number] Cheron Schaumann, [place]

## 2017-07-04 NOTE — Progress Notes (Signed)
[employer] - [place] at [facility]   PATIENT NAME: Crystal Todd    MR#:  [id number]  DATE OF BIRTH:  04-Jan-1928  SUBJECTIVE:  CHIEF COMPLAINT:   Chief Complaint  Patient presents with  . Shortness of Breath   Patient well known to me from last admission.  Was here for influenza a and COPD exacerbation and was discharged to assisted living facility on oxygen.  Today she is laying in bed on BiPAP.  Awake and alert.  REVIEW OF SYSTEMS:    Review of Systems  Constitutional: Positive for malaise/fatigue. Negative for chills and fever.  HENT: Negative for sore throat.   Eyes: Negative for blurred vision, double vision and pain.  Respiratory: Positive for cough and shortness of breath. Negative for hemoptysis and wheezing.   Cardiovascular: Negative for chest pain, palpitations, orthopnea and leg swelling.  Gastrointestinal: Negative for abdominal pain, constipation, diarrhea, heartburn, nausea and vomiting.  Genitourinary: Negative for dysuria and hematuria.  Musculoskeletal: Negative for back pain and joint pain.  Skin: Negative for rash.  Neurological: Negative for sensory change, speech change, focal weakness and headaches.  Endo/Heme/Allergies: Does not bruise/bleed easily.  Psychiatric/Behavioral: Negative for depression. The patient is not nervous/anxious.     DRUG ALLERGIES:   Allergies  Allergen Reactions  . Ace Inhibitors     Other reaction(s): Other (See Comments) Other Reaction: Other reaction  . Alendronate     Other reaction(s): Other (See Comments) Other Reaction: Other reaction  . Influenza Vaccines Other (See Comments)    "kept the flu for a year"    VITALS:  Blood pressure 98/71, pulse 90, temperature 98.1 F (36.7 C), resp. rate 18, height 5\' 3"  (1.6 m), weight 48.4 kg (106 lb 11.2 oz), SpO2 96 %.  PHYSICAL EXAMINATION:   Physical Exam  GENERAL:  84 y.o.-year-old patient lying in the bed , on BiPAP EYES: Pupils equal, round,  reactive to light and accommodation. No scleral icterus. Extraocular muscles intact.  HEENT: Head atraumatic, normocephalic. Oropharynx and nasopharynx clear.  NECK:  Supple, no jugular venous distention. No thyroid enlargement, no tenderness.  LUNGS: Bilateral wheezing.  CARDIOVASCULAR: S1, S2 normal. No murmurs, rubs, or gallops.  ABDOMEN: Soft, nontender, nondistended. Bowel sounds present. No organomegaly or mass.  EXTREMITIES: No cyanosis, clubbing or edema b/l.    NEUROLOGIC: Cranial nerves II through XII are intact. No focal Motor or sensory deficits b/l.   PSYCHIATRIC: The patient is alert and awake SKIN: No obvious rash, lesion, or ulcer.   LABORATORY PANEL:   CBC Recent Labs  Lab 07/04/17 0549  WBC 9.1  HGB 11.6*  HCT 36.4  PLT 201   ------------------------------------------------------------------------------------------------------------------ Chemistries  Recent Labs  Lab 06/11/2017 2017 06/06/2017 2332 07/04/17 0549  NA 136  --  137  K 4.5  --  4.7  CL 101  --  104  CO2 22  --  23  GLUCOSE 161*  --  179*  BUN 23*  --  28*  CREATININE 0.93  --  0.71  CALCIUM 7.8*  --  7.8*  MG  --  3.0*  --   AST 21  --   --   ALT 19  --   --   ALKPHOS 68  --   --   BILITOT 1.0  --   --    ------------------------------------------------------------------------------------------------------------------  Cardiac Enzymes Recent Labs  Lab 06/17/2017 2017  TROPONINI 0.06*   ------------------------------------------------------------------------------------------------------------------  RADIOLOGY:  Dg Chest 2 View  Result Date: 07/02/2017 CLINICAL  DATA:  Generalized weakness. Increasing shortness of breath today. Patient was diagnosed with influenza 06/30/2017. EXAM: CHEST - 2 VIEW COMPARISON:  Single-view of the chest. PA and lateral 06/26/2017 chest 06/17/2013. FINDINGS: There is eventration of the left hemidiaphragm. Heart size is upper normal there is vascular  congestion. No consolidative process, pneumothorax or pleural fluid. Linear atelectasis left lung base noted. Aortic atherosclerosis is noted. No acute bony abnormality. Remote left humerus fracture and thoracic spine fractures noted. IMPRESSION: Cardiomegaly and vascular congestion. Left basilar atelectasis. Atherosclerosis. Electronically Signed   By: Drusilla Kanner M.D.   On: 07/02/2017 12:15   Dg Chest Portable 1 View  Result Date: 06/07/2017 CLINICAL DATA:  Weakness EXAM: PORTABLE CHEST 1 VIEW COMPARISON:  07/02/2017, 06/26/2017, 06/18/2011 FINDINGS: Limited by habitus and positioning enlarged cardiomediastinal silhouette with vascular congestion. Possible increased airspace disease at the left base. No obvious pneumothorax. Proximal left humerus fracture again noted IMPRESSION: Limited by habitus and positioning. Possible increased airspace disease at the left base. Cardiomegaly with vascular congestion Electronically Signed   By: Jasmine Pang M.D.   On: 07/06/2017 18:50     ASSESSMENT AND PLAN:   *Acute hypoxic respiratory failure secondary to pneumonia and sepsis Continue BiPAP support.  On broad-spectrum IV antibiotics.  Wait for culture results.  Nebulizers. Intensivist consulted.  *COPD exacerbation.  Scheduled nebulizers and IV steroids.  *Mild elevation in troponin due to demand ischemia.  Not MI.  All the records are reviewed and case discussed with Care Management/Social Workerr. Management plans discussed with the patient, family and they are in agreement.  CODE STATUS: DNR  DVT Prophylaxis: SCDs  TOTAL TIME TAKING CARE OF THIS PATIENT: 30 minutes.   POSSIBLE D/C IN 2-3 DAYS, DEPENDING ON CLINICAL CONDITION.  Molinda Bailiff Arnett Galindez M.D on 07/04/2017 at 11:14 AM  Between 7am to 6pm - Pager - [phone]  After 6pm go to [email or web address] - password [email or web address]  [facility]  Office  [phone]  CC: Primary care physician; Patient, No Pcp Per  Note: This  dictation was prepared with Dragon dictation along with smaller phrase technology. Any transcriptional errors that result from this process are unintentional.

## 2017-07-04 NOTE — Progress Notes (Signed)
ANTIBIOTIC CONSULT NOTE   Pharmacy Consult for vancomycin and cefepime  Indication: pneumonia   Plan:  1. Vancomycin discontinued. MRSA PCR resulted as negative.  2. Will continue cefepime 2g IV q12h (3/28>>   )   Microbiology: Recent Results (from the past 720 hour(s))  Blood culture (routine x 2)     Status: None   Collection Time: 06/26/17  5:36 PM  Result Value Ref Range Status   Specimen Description BLOOD RFOA  Final   Special Requests   Final    BOTTLES DRAWN AEROBIC AND ANAEROBIC Blood Culture adequate volume   Culture   Final    NO GROWTH 5 DAYS Performed at [facility], [address] [place], [place] [address]    Report Status 07/01/2017 FINAL  Final  Blood culture (routine x 2)     Status: None   Collection Time: 06/26/17  5:36 PM  Result Value Ref Range Status   Specimen Description BLOOD LEFT WRIST  Final   Special Requests   Final    BOTTLES DRAWN AEROBIC AND ANAEROBIC Blood Culture adequate volume   Culture   Final    NO GROWTH 5 DAYS Performed at [facility], [address] [place], [place] [address]    Report Status 07/01/2017 FINAL  Final  MRSA PCR Screening     Status: None   Collection Time: 06/28/17  5:10 AM  Result Value Ref Range Status   MRSA by PCR NEGATIVE NEGATIVE Final    Comment:        The GeneXpert MRSA Assay (FDA approved for NASAL specimens only), is one component of a comprehensive MRSA colonization surveillance program. It is not intended to diagnose MRSA infection nor to guide or monitor treatment for MRSA infections. Performed at [facility], [address] [place], [place] [address]   Blood Culture (routine x 2)     Status: None (Preliminary result)   Collection Time: 14-Jul-2017  7:03 PM  Result Value Ref Range Status   Specimen Description BLOOD LEFT WRIST  Final   Special Requests   Final    BOTTLES DRAWN AEROBIC AND ANAEROBIC Blood Culture adequate volume   Culture   Final    NO GROWTH <  12 HOURS Performed at [facility], [address] [place], [place] [address]    Report Status PENDING  Incomplete  Blood Culture (routine x 2)     Status: None (Preliminary result)   Collection Time: July 14, 2017  8:14 PM  Result Value Ref Range Status   Specimen Description BLOOD BLOOD LEFT FOREARM  Final   Special Requests   Final    BOTTLES DRAWN AEROBIC AND ANAEROBIC Blood Culture adequate volume   Culture   Final    NO GROWTH < 12 HOURS Performed at [facility], [address] [place], [place] [address]    Report Status PENDING  Incomplete  MRSA PCR Screening     Status: None   Collection Time: 07/14/17 10:52 PM  Result Value Ref Range Status   MRSA by PCR NEGATIVE NEGATIVE Final    Comment:        The GeneXpert MRSA Assay (FDA approved for NASAL specimens only), is one component of a comprehensive MRSA colonization surveillance program. It is not intended to diagnose MRSA infection nor to guide or monitor treatment for MRSA infections. Performed at [facility], [address], [place], [place] [address]       Cleopatra Cedar, PharmD Pharmacy Resident  07/04/2017,4:20 PM

## 2017-07-04 NOTE — Clinical Social Work Note (Signed)
Clinical Social Work Assessment  Patient Details  Name: Crystal Todd MRN: [id number] Date of Birth: [birth year]  Date of referral:  07/04/17               Reason for consult:  Family Concerns                Permission sought to share information with:  Facility Medical sales representative Permission granted to share information::  Yes, Verbal Permission Granted  Name::     Steward Drone Sau [place] [phone])  Agency::  [facility]  Relationship::     Contact Information:     Housing/Transportation Living arrangements for the past 2 months:  Assisted Living Facility Source of Information:  Patient Patient Interpreter Needed:  None Criminal Activity/Legal Involvement Pertinent to Current Situation/Hospitalization:  No - Comment as needed Significant Relationships:  Other Family Members Lives with:  Facility Resident Do you feel safe going back to the place where you live?  Yes Need for family participation in patient care:  No (Coment)  Care giving concerns:  TBD   Office manager / plan: TBD From doctors and medical staff notes: 78 F from nursing home recent hosp for influenza and pneumonia now presenting with hypotension and acute on chronic hypoxic respiratory failure secondary to pneumonia and AECOPD requiring Bipap   She is now off BiPAP. Reports losing weight from 120 pounds to 110 pounds after she fell 12/26/2016. She states she has been gaining weight back since then. According to chart she was 109 pounds 01/29/2017, weight seems to have been relatively stable since then, was 106 pounds upon admission. BUN is 28. She reports normally eating cereal, toast and bacon for breakfast, not much for lunch bites of whatever is served, and hot dogs, cake, and sweet tea for dinner. Was drinking boost at one point but has since stopped. States appetite is so-so, not really hungry, reports this to be chronic.     Employment status:  Retired Health and safety inspector:  (S)  Medicare(managed ) PT Recommendations:  Not assessed at this time Information / Referral to community resources:     Patient/Family's Response to care:  TBD  Patient/Family's Understanding of and Emotional Response to Diagnosis, Current Treatment, and Prognosis:  TBD  Emotional Assessment Appearance:  Appears stated age Attitude/Demeanor/Rapport:  Gracious Affect (typically observed):  Calm, Appropriate Orientation:  Oriented to Self, Oriented to Place, Oriented to  Time, Oriented to Situation Alcohol / Substance use:  Not Applicable Psych involvement (Current and /or in the community):  No (Comment)  Discharge Needs  Concerns to be addressed:  Adjustment to Illness Readmission within the last 30 days:  No Current discharge risk:  None Barriers to Discharge:  Continued Medical Work up   [place], [place], LCSW 07/04/2017, 5:16 PM

## 2017-07-04 NOTE — Progress Notes (Signed)
LCSW introduced myself to patient. She was enjoying a bath by nurses. LCSW greeted her briefly and will return tomorrow to complete remainder of her assessment. Patient appeared to be in good spritis a lot of assessment data was collected from previous assessment composed only a week ago.  No further needs at this time

## 2017-07-04 NOTE — Care Management (Addendum)
Patient is readmission from the [facility] [facility]. It looks like supplemental O2 and a nebulizer was obtained from [facility] at last/recent visit.  Patient also had an order for home health and [facility] was arranged.  RNCM checking O2 with [facility]-  Confirmed with Barbara Cower at [facility] that patient is on chronic O2 at 2LPM continuous.  Message sent to [facility].  At 1310: RNCM received notification (from [facility])  that patient was never opened to their agency as they are not in-network however typically when an agency accepts a patient they reach out to another agency if they cannot meet the patient's needs. Patient was accepted by Chip Boer with [facility] on 06/30/17.  I do not see any notification from [facility] letting RNCM know they could not take patient. I have requested that [facility]) research this concern. [facility] has no record of this patient and cannot accept patient due to out of network with their agency/patient's insurance.

## 2017-07-04 NOTE — Progress Notes (Addendum)
Good day. Off BiPAP since 1200. Talkative with extreme shortness of breath at times. Noted to have bruises where she has had lab draws. States she feels better today. Unable to tell me what her daily medications are or why she takes them. Patient did state that the nursing facility gives her medications in applesauce. Patient request no more BiPAP even with shortness of breath. PO intake was poor today. UOP was marginal with external catheter. Incontinent x 1. Remains in A.Fib and on Amiodorone drip at 30 mgs /hour. Heart rate ranges from 80s-120s.

## 2017-07-04 NOTE — Progress Notes (Signed)
Initial Nutrition Assessment  DOCUMENTATION CODES:   Underweight  INTERVENTION:  1. Ensure Enlive po BID, each supplement provides 350 kcal and 20 grams of protein  NUTRITION DIAGNOSIS:   Increased nutrient needs related to acute illness, chronic illness as evidenced by estimated needs.  GOAL:   Patient will meet greater than or equal to 90% of their needs  MONITOR:   PO intake, I & O's, Labs, Supplement acceptance, Weight trends  REASON FOR ASSESSMENT:   Rounds    ASSESSMENT:   28 F from nursing home recent hosp for influenza and pneumonia now presenting with hypotension and acute on chronic hypoxic respiratory failure secondary to pneumonia and AECOPD requiring Bipap  She is now off BiPAP. Reports losing weight from 120 pounds to 110 pounds after she fell 12/26/2016. She states she has been gaining weight back since then. According to chart she was 109 pounds 01/29/2017, weight seems to have been relatively stable since then, was 106 pounds upon admission. BUN is 28. She reports normally eating cereal, toast and bacon for breakfast, not much for lunch bites of whatever is served, and hot dogs, cake, and sweet tea for dinner. Was drinking boost at one point but has since stopped. States appetite is so-so, not really hungry, reports this to be chronic.  Labs reviewed  Medications reviewed and include:  Solumedrol NS at 62mL/hr Amiodarone gtt  NUTRITION - FOCUSED PHYSICAL EXAM:    Most Recent Value  Orbital Region  No depletion  Upper Arm Region  No depletion  Thoracic and Lumbar Region  No depletion  Buccal Region  No depletion  Temple Region  Moderate depletion  Clavicle Bone Region  Moderate depletion  Clavicle and Acromion Bone Region  Moderate depletion  Scapular Bone Region  Unable to assess  Dorsal Hand  No depletion  Patellar Region  No depletion  Anterior Thigh Region  No depletion  Posterior Calf Region  No depletion  Edema (RD Assessment)  None        Diet Order:  DIET - DYS 1 Room service appropriate? Yes; Fluid consistency: Thin  EDUCATION NEEDS:   Not appropriate for education at this time  Skin:  Skin Assessment: Reviewed RN Assessment  Last BM:  06/09/2017 (type 5)  Height:   Ht Readings from Last 1 Encounters:  06/17/2017 5\' 3"  (1.6 m)    Weight:   Wt Readings from Last 1 Encounters:  06/28/2017 106 lb 11.2 oz (48.4 kg)    Ideal Body Weight:  52.27 kg  BMI:  Body mass index is 18.9 kg/m.  Estimated Nutritional Needs:   Kcal:  1200-1500 calories  Protein:  63-72 grams (1.3-1.5g/kg)  Fluid:  >1.5L  07/05/17. Terre Zabriskie, MS, RD LDN Inpatient Clinical Dietitian Pager [phone]

## 2017-07-05 ENCOUNTER — Encounter: Payer: Self-pay | Admitting: *Deleted

## 2017-07-05 ENCOUNTER — Inpatient Hospital Stay: Payer: Medicare Other

## 2017-07-05 ENCOUNTER — Inpatient Hospital Stay: Admit: 2017-07-05 | Payer: Medicare Other

## 2017-07-05 DIAGNOSIS — I4891 Unspecified atrial fibrillation: Secondary | ICD-10-CM

## 2017-07-05 LAB — GLUCOSE, CAPILLARY: GLUCOSE-CAPILLARY: 158 mg/dL — AB (ref 65–99)

## 2017-07-05 LAB — TROPONIN I: Troponin I: <0.03 ng/mL (ref <0.03)

## 2017-07-05 LAB — PROTIME-INR
INR: 1.09
Prothrombin Time: 14 seconds (ref 11.4–15.2)

## 2017-07-05 LAB — TSH: TSH: 0.648 u[IU]/mL (ref 0.350–4.500)

## 2017-07-05 LAB — APTT: APTT: 33 s (ref 24–36)

## 2017-07-05 MED ORDER — HEPARIN BOLUS VIA INFUSION
2900.0000 [IU] | Freq: Once | INTRAVENOUS | Status: AC
  Administered 2017-07-05: 2900 [IU] via INTRAVENOUS
  Filled 2017-07-05: qty 2900

## 2017-07-05 MED ORDER — METOPROLOL TARTRATE 25 MG PO TABS
12.5000 mg | ORAL_TABLET | Freq: Two times a day (BID) | ORAL | Status: DC
  Administered 2017-07-05: 12.5 mg via ORAL
  Filled 2017-07-05 (×2): qty 1

## 2017-07-05 MED ORDER — HEPARIN (PORCINE) IN NACL 100-0.45 UNIT/ML-% IJ SOLN
750.0000 [IU]/h | INTRAMUSCULAR | Status: DC
  Administered 2017-07-05: 750 [IU]/h via INTRAVENOUS
  Filled 2017-07-05: qty 250

## 2017-07-05 MED ORDER — METHYLPREDNISOLONE SODIUM SUCC 40 MG IJ SOLR
20.0000 mg | Freq: Two times a day (BID) | INTRAMUSCULAR | Status: DC
  Administered 2017-07-05 – 2017-07-06 (×2): 20 mg via INTRAVENOUS
  Filled 2017-07-05 (×2): qty 1

## 2017-07-05 NOTE — Progress Notes (Signed)
ANTICOAGULATION CONSULT NOTE - Initial Consult  Pharmacy Consult for heparin gtt Indication: atrial fibrillation  Allergies  Allergen Reactions  . Ace Inhibitors     Other reaction(s): Other (See Comments) Other Reaction: Other reaction  . Alendronate     Other reaction(s): Other (See Comments) Other Reaction: Other reaction  . Influenza Vaccines Other (See Comments)    "kept the flu for a year"    Patient Measurements: Height: 5\' 3"  (160 cm) Weight: 106 lb 11.2 oz (48.4 kg) IBW/kg (Calculated) : 52.4 Heparin Dosing Weight: 48.4kg  Vital Signs: Temp: 97.8 F (36.6 C) (03/30 1600) Temp Source: Oral (03/30 1600) BP: 111/81 (03/30 1800) Pulse Rate: 141 (03/30 1800)  Labs: Recent Labs    06/19/2017 2017 06/17/2017 2332 07/04/17 0549 07/05/17 0751 07/05/17 1404 07/05/17 1917  HGB 12.9  --  11.6*  --   --   --   HCT 38.6  --  36.4  --   --   --   PLT 230  --  201  --   --   --   APTT  --  32  --   --   --   --   LABPROT  --  15.1  --   --   --   --   INR  --  1.20  --   --   --   --   CREATININE 0.93  --  0.71  --   --   --   TROPONINI 0.06*  --   --  <0.03 <0.03 <0.03    Estimated Creatinine Clearance: 37.9 mL/min (by C-G formula based on SCr of 0.71 mg/dL).   Medical History: Past Medical History:  Diagnosis Date  . Arthritis    hands, knees  . Asthma   . COPD (chronic obstructive pulmonary disease) (HCC)   . Dysrhythmia    A-Fib  . Full dentures    upper and lower  . GERD (gastroesophageal reflux disease)   . Hypercholesteremia   . Hypertension   . Osteoporosis   . Paroxysmal atrial fibrillation (HCC)   . Shortness of breath dyspnea     Medications:  Medications Prior to Admission  Medication Sig Dispense Refill Last Dose  . acetaminophen (TYLENOL) 325 MG tablet Take 650 mg by mouth every 4 (four) hours as needed for fever (up to 101).   PRN at PRN  . albuterol (PROVENTIL HFA;VENTOLIN HFA) 108 (90 Base) MCG/ACT inhaler Inhale 2 puffs into the lungs  every 6 (six) hours as needed for wheezing or shortness of breath. 1 Inhaler 2 PRN at PRN  . alum & mag hydroxide-simeth (ANTACID) 200-200-20 MG/5ML suspension Take 30 mLs by mouth every 6 (six) hours as needed for indigestion or heartburn.   PRN at PRN  . amLODipine (NORVASC) 5 MG tablet Take 5 mg by mouth daily.   06/07/2017 at 0900  . cholecalciferol (VITAMIN D) 1000 units tablet Take 1,000 Units by mouth daily.   06/14/2017 at 0900  . diphenhydrAMINE (BENADRYL) 25 MG tablet Take 25 mg by mouth every 6 (six) hours as needed for allergies.   PRN at PRN  . divalproex (DEPAKOTE SPRINKLE) 125 MG capsule Take 125-250 mg by mouth 2 (two) times daily. 125 mg every morning and 250 mg at bedtime   06/16/2017 at 0900  . fluticasone (FLONASE) 50 MCG/ACT nasal spray Place 2 sprays into the nose 2 (two) times daily.   06/23/2017 at 0900  . guaiFENesin (ROBITUSSIN) 100 MG/5ML liquid Take 300  mg by mouth every 6 (six) hours as needed for cough.   PRN at PRN  . ipratropium-albuterol (DUONEB) 0.5-2.5 (3) MG/3ML SOLN Take 3 mLs by nebulization every 6 (six) hours as needed (SOB or wheezing). (Patient taking differently: Take 3 mLs by nebulization every 6 (six) hours as needed. For wheezing or shortness of breath) 360 mL 0 PRN at PRN  . loperamide (IMODIUM A-D) 2 MG capsule Take 4 mg by mouth as needed for diarrhea or loose stools.   PRN at PRN  . magnesium hydroxide (MILK OF MAGNESIA) 400 MG/5ML suspension Take 30 mLs by mouth daily as needed for mild constipation.   PRN at PRN  . metoprolol tartrate (LOPRESSOR) 25 MG tablet Take 12.5 mg by mouth 2 (two) times daily.   06/21/2017 at 0900  . multivitamin (PROSIGHT) TABS tablet Take 1 tablet by mouth daily.   06/12/2017 at 0900  . omeprazole (PRILOSEC) 20 MG capsule Take 20 mg by mouth daily.   06/21/2017 at 0900  . oseltamivir (TAMIFLU) 30 MG capsule Take 1 capsule (30 mg total) by mouth 2 (two) times daily. 3 capsule 0 06/29/2017 at 0900  . PARoxetine (PAXIL) 20 MG tablet  Take 20 mg by mouth daily.   06/14/2017 at 0900  . predniSONE (DELTASONE) 10 MG tablet Take 10 mg by mouth 4 (four) times daily. For 2 days, then 3 times daily for 2 days, then 2 times daily for 2 days, then daily for 2 days, then stop   06/29/2017 at 1700  . vitamin B-12 (CYANOCOBALAMIN) 1000 MCG tablet Take 1,000 mcg by mouth daily.   07/05/2017 at 0900  . levofloxacin (LEVAQUIN) 250 MG tablet Take 1 tablet (250 mg total) by mouth daily for 7 days. (Patient not taking: Reported on 06/25/2017) 7 tablet 0 Not Taking at Unknown time   Scheduled:  . aspirin EC  81 mg Oral Daily  . budesonide (PULMICORT) nebulizer solution  0.25 mg Nebulization BID  . divalproex  125 mg Oral q morning - 10a   And  . divalproex  250 mg Oral QHS  . feeding supplement (ENSURE ENLIVE)  237 mL Oral BID BM  . heparin  2,900 Units Intravenous Once  . levalbuterol  0.63 mg Nebulization Q6H  . mouth rinse  15 mL Mouth Rinse q12n4p  . methylPREDNISolone (SOLU-MEDROL) injection  20 mg Intravenous Q12H  . metoprolol tartrate  12.5 mg Oral BID  . oseltamivir  30 mg Oral BID  . pantoprazole  40 mg Oral Daily  . PARoxetine  20 mg Oral Daily   Infusions:  . sodium chloride 50 mL/hr at 07/05/17 1800  . amiodarone 30 mg/hr (07/05/17 1800)  . ceFEPime (MAXIPIME) IV Stopped (07/05/17 1115)  . heparin     PRN: acetaminophen **OR** acetaminophen, alum & mag hydroxide-simeth, bisacodyl, diphenhydrAMINE, guaiFENesin, HYDROcodone-acetaminophen, ketorolac, levalbuterol, magnesium hydroxide, ondansetron **OR** ondansetron (ZOFRAN) IV, senna-docusate Anti-infectives (From admission, onward)   Start     Dose/Rate Route Frequency Ordered Stop   07/04/17 1300  vancomycin (VANCOCIN) IVPB 750 mg/150 ml premix  Status:  Discontinued     750 mg 150 mL/hr over 60 Minutes Intravenous Every 24 hours 06/28/2017 2157 07/04/17 1038   07/04/17 1200  oseltamivir (TAMIFLU) capsule 30 mg     30 mg Oral 2 times daily 07/04/17 1039 07/07/17 0959    07/04/17 0800  ceFEPIme (MAXIPIME) 2 g in sodium chloride 0.9 % 100 mL IVPB     2 g 200 mL/hr over 30 Minutes Intravenous  Every 12 hours 06/09/2017 2148     06/12/2017 1845  ceFEPIme (MAXIPIME) 2 g in sodium chloride 0.9 % 100 mL IVPB     2 g 200 mL/hr over 30 Minutes Intravenous  Once 06/10/2017 1836 07/01/2017 2052   07/06/2017 1845  vancomycin (VANCOCIN) IVPB 1000 mg/200 mL premix     1,000 mg 200 mL/hr over 60 Minutes Intravenous  Once 06/19/2017 1836 06/28/2017 2207      Assessment: 84 year old man requiring anticoagulation with heparin gtt  Goal of Therapy:  Heparin level 0.3-0.7 units/ml Monitor platelets by anticoagulation protocol: Yes   Plan:  Give 3600 units bolus x 1 Start heparin infusion at 750 units/hr Check anti-Xa level in 6 hours and daily while on heparin Continue to monitor H&H and platelets  Crystal Todd 07/05/2017,8:31 PM

## 2017-07-05 NOTE — Progress Notes (Signed)
Name: Crystal Todd MRN: [id number] DOB: [birth year]     CONSULTATION DATE: 07/08/17  Subjective: Feels better, sitting in bed and eating lunch  Objective: Tolerating nasal cannula, remains on amiodarone drip and no major issues last night    PAST MEDICAL HISTORY :   has a past medical history of Arthritis, Asthma, COPD (chronic obstructive pulmonary disease) (HCC), Dysrhythmia, Full dentures, GERD (gastroesophageal reflux disease), Hypercholesteremia, Hypertension, Osteoporosis, Paroxysmal atrial fibrillation (HCC), and Shortness of breath dyspnea.  has a past surgical history that includes Back surgery; Esophagogastroduodenoscopy (egd) with esophageal dilation; Joint replacement; Wrist fracture surgery (Bilateral); and Cataract extraction w/PHACO (Left, 10/12/2014). Prior to Admission medications   Medication Sig Start Date End Date Taking? Authorizing Provider  acetaminophen (TYLENOL) 325 MG tablet Take 650 mg by mouth every 4 (four) hours as needed for fever (up to 101).   Yes [provider]  albuterol (PROVENTIL HFA;VENTOLIN HFA) 108 (90 Base) MCG/ACT inhaler Inhale 2 puffs into the lungs every 6 (six) hours as needed for wheezing or shortness of breath. 07/02/17  Yes Willy Eddy, MD  alum & mag hydroxide-simeth (ANTACID) 200-200-20 MG/5ML suspension Take 30 mLs by mouth every 6 (six) hours as needed for indigestion or heartburn.   Yes [provider]  amLODipine (NORVASC) 5 MG tablet Take 5 mg by mouth daily.   Yes [provider]  cholecalciferol (VITAMIN D) 1000 units tablet Take 1,000 Units by mouth daily.   Yes [provider]  diphenhydrAMINE (BENADRYL) 25 MG tablet Take 25 mg by mouth every 6 (six) hours as needed for allergies.   Yes [provider]  divalproex (DEPAKOTE SPRINKLE) 125 MG capsule Take 125-250 mg by mouth 2 (two) times daily. 125 mg every morning and 250 mg at bedtime   Yes [provider]    fluticasone (FLONASE) 50 MCG/ACT nasal spray Place 2 sprays into the nose 2 (two) times daily.   Yes [provider]  guaiFENesin (ROBITUSSIN) 100 MG/5ML liquid Take 300 mg by mouth every 6 (six) hours as needed for cough.   Yes [provider]  ipratropium-albuterol (DUONEB) 0.5-2.5 (3) MG/3ML SOLN Take 3 mLs by nebulization every 6 (six) hours as needed (SOB or wheezing). Patient taking differently: Take 3 mLs by nebulization every 6 (six) hours as needed. For wheezing or shortness of breath 06/30/17  Yes Sudini, Wardell Heath, MD  loperamide (IMODIUM A-D) 2 MG capsule Take 4 mg by mouth as needed for diarrhea or loose stools.   Yes [provider]  magnesium hydroxide (MILK OF MAGNESIA) 400 MG/5ML suspension Take 30 mLs by mouth daily as needed for mild constipation.   Yes [provider]  metoprolol tartrate (LOPRESSOR) 25 MG tablet Take 12.5 mg by mouth 2 (two) times daily.   Yes [provider]  multivitamin (PROSIGHT) TABS tablet Take 1 tablet by mouth daily.   Yes [provider]  omeprazole (PRILOSEC) 20 MG capsule Take 20 mg by mouth daily.   Yes [provider]  oseltamivir (TAMIFLU) 30 MG capsule Take 1 capsule (30 mg total) by mouth 2 (two) times daily. 06/30/17  Yes Sudini, Wardell Heath, MD  PARoxetine (PAXIL) 20 MG tablet Take 20 mg by mouth daily.   Yes [provider]  predniSONE (DELTASONE) 10 MG tablet Take 10 mg by mouth 4 (four) times daily. For 2 days, then 3 times daily for 2 days, then 2 times daily for 2 days, then daily for 2 days, then stop 07/01/17  Yes [provider]  vitamin B-12 (CYANOCOBALAMIN) 1000 MCG tablet Take 1,000 mcg by mouth daily.   Yes [provider]  levofloxacin (LEVAQUIN) 250 MG tablet Take 1 tablet (250 mg total) by mouth daily for 7 days. Patient not taking: Reported on 06/30/2017 07/02/17 07/09/17  Willy Eddy, MD   Allergies  Allergen Reactions  . Ace Inhibitors      Other reaction(s): Other (See Comments) Other Reaction: Other reaction  . Alendronate     Other reaction(s): Other (See Comments) Other Reaction: Other reaction  . Influenza Vaccines Other (See Comments)    "kept the flu for a year"    FAMILY HISTORY:  family history is not on file. SOCIAL HISTORY:  reports that she quit smoking about 43 years ago. She has never used smokeless tobacco. She reports that she does not drink alcohol.  REVIEW OF SYSTEMS:   Unable to obtain due to critical illness   VITAL SIGNS: Temp:  [97.6 F (36.4 C)-98.1 F (36.7 C)] 97.9 F (36.6 C) (03/30 1200) Pulse Rate:  [89-128] 125 (03/30 1400) Resp:  [16-40] 40 (03/30 1400) BP: (86-114)/(49-76) 106/67 (03/30 1400) SpO2:  [89 %-98 %] 95 % (03/30 1400) FiO2 (%):  [45 %] 45 % (03/30 0205)  Physical Examination:  Awake and oriented there is no acute neurological deficits Tolerating nasal cannula, no distress, able to talk in full sentences, BEAE and no rales S1 + S2 audible no murmur Abdomen was normal peristalsis List of the extremities no edema   ASSESSMENT / PLAN:  Acute hypoxic respiratory failure on chronic respiratory failure baseline home O2 2 L/min -Improved and tolerating nasal cannula.  Monitor work of breathing and O2 sat CODE STATUS DNR  Pneumonia.  Improved airspace disease -Continue cefepime and s/p vancomycin MRSA PCR negative -Monitor CXR + CBC + FiO2  Flu. -Continue Tamiflu for a total of 10 days of therapy.  Hypotension with septic shock [less likely with pro-calcitonin less than 0.1] and meds.  -Optimize medication, consider pressors if systolic blood pressure less than 90 and continue to monitor hemodynamics  A. fib with RVR on amiodarone drip -Optimize BB COPD exacerbation -Bronchodilators and taper steroids.  Mild elevation of troponin due to demand versus supply mismatch -Monitor EKG, echocardiogram and cardiac enzymes. -Start on aspirin.  DNR  DVT & GI  prophylaxis.  Continue with supportive care Critical care time 35 min

## 2017-07-05 NOTE — Progress Notes (Signed)
[facility] [place] [facility]   PATIENT NAME: Crystal Todd    MR#:  [id number]  DATE OF BIRTH:  1927-08-24  SUBJECTIVE:   Patient still with this of breath this a.m.  Feels generalized weakness. REVIEW OF SYSTEMS:    Review of Systems  Constitutional: Negative for fever, chills weight loss Positive generalized weakness HENT: Negative for ear pain, nosebleeds, congestion, facial swelling, rhinorrhea, neck pain, neck stiffness and ear discharge.   Respiratory: Positive for cough and shortness of breath no wheezing  cardiovascular: Negative for chest pain, palpitations and leg swelling.  Gastrointestinal: Negative for heartburn, abdominal pain, vomiting, diarrhea or consitpation Genitourinary: Negative for dysuria, urgency, frequency, hematuria Musculoskeletal: Negative for back pain or joint pain Neurological: Negative for dizziness, seizures, syncope, focal weakness,  numbness and headaches.  Hematological: Does not bruise/bleed easily.  Psychiatric/Behavioral: Negative for hallucinations, confusion, dysphoric mood    Tolerating Diet: yes      DRUG ALLERGIES:   Allergies  Allergen Reactions  . Ace Inhibitors     Other reaction(s): Other (See Comments) Other Reaction: Other reaction  . Alendronate     Other reaction(s): Other (See Comments) Other Reaction: Other reaction  . Influenza Vaccines Other (See Comments)    "kept the flu for a year"    VITALS:  Blood pressure 99/67, pulse (!) 114, temperature 97.8 F (36.6 C), temperature source Oral, resp. rate 20, height 5\' 3"  (1.6 m), weight 48.4 kg (106 lb 11.2 oz), SpO2 96 %.  PHYSICAL EXAMINATION:  Constitutional: Appears thin and frail  hENT: Normocephalic. Oropharynx is clear and moist.  Eyes: Conjunctivae and EOM are normal. PERRLA, no scleral icterus.  Neck: Normal ROM. Neck supple. No JVD. No tracheal deviation. CVS: Tachycardic S1/S2 +, no murmurs, no gallops, no carotid bruit.   Pulmonary: Effort and breath sounds normal, no stridor, rhonchi, wheezes, rales.  Abdominal: Soft. BS +,  no distension, tenderness, rebound or guarding.  Musculoskeletal: Normal range of motion. No edema and no tenderness.  Neuro: Alert. CN 2-12 grossly intact. No focal deficits. Skin: Skin is warm and dry. No rash noted. Psychiatric: Normal mood and affect.      LABORATORY PANEL:   CBC Recent Labs  Lab 07/04/17 0549  WBC 9.1  HGB 11.6*  HCT 36.4  PLT 201   ------------------------------------------------------------------------------------------------------------------  Chemistries  Recent Labs  Lab 2017-07-31 2017 2017/07/31 2332 07/04/17 0549  NA 136  --  137  K 4.5  --  4.7  CL 101  --  104  CO2 22  --  23  GLUCOSE 161*  --  179*  BUN 23*  --  28*  CREATININE 0.93  --  0.71  CALCIUM 7.8*  --  7.8*  MG  --  3.0*  --   AST 21  --   --   ALT 19  --   --   ALKPHOS 68  --   --   BILITOT 1.0  --   --    ------------------------------------------------------------------------------------------------------------------  Cardiac Enzymes Recent Labs  Lab 07/02/17 1118 07/31/2017 2017 07/05/17 0751  TROPONINI <0.03 0.06* <0.03   ------------------------------------------------------------------------------------------------------------------  RADIOLOGY:  Dg Chest Port 1 View  Result Date: 07/05/2017 CLINICAL DATA:  Acute respiratory failure EXAM: PORTABLE CHEST 1 VIEW COMPARISON:  Two days ago FINDINGS: Tall and narrow chest. Retrocardiac opacity persists. Borderline cardiomegaly. No Kerley lines, effusion, or pneumothorax. Remote left humerus and mid clavicle fractures. IMPRESSION: Persistent retrocardiac opacity that could reflect pneumonia, atelectasis, or elevated  diaphragm. No new abnormality. Electronically Signed   By: Marnee Spring M.D.   On: 07/05/2017 08:06   Dg Chest Portable 1 View  Result Date: 11-Jul-2017 CLINICAL DATA:  Weakness EXAM: PORTABLE CHEST  1 VIEW COMPARISON:  07/02/2017, 06/26/2017, 06/18/2011 FINDINGS: Limited by habitus and positioning enlarged cardiomediastinal silhouette with vascular congestion. Possible increased airspace disease at the left base. No obvious pneumothorax. Proximal left humerus fracture again noted IMPRESSION: Limited by habitus and positioning. Possible increased airspace disease at the left base. Cardiomegaly with vascular congestion Electronically Signed   By: Jasmine Pang M.D.   On: Jul 11, 2017 18:50     ASSESSMENT AND PLAN:   84 year old female with history of COPD, essential hypertension and PAF with recent discharge from the hospital for influenza A and COPD who presented with shortness of breath.  1.  Septic shock with acute hypoxic respiratory failure Patient presented with hypotension, tachycardia, leukocytosis and fever  Sepsis due to pneumonia and influenza A Patient weaned off of BiPAP  2.HCAP:  Continue cefepime MRSA PCR negative and therefore discontinue vancomycin  3.  Influenza A: Intensivist is recommending 10 days of therapy of Tamiflu. 4.  Septic shock: Blood pressure showing some improvement  5.  PAF with RVR Continue amiodarone Follow-up on echo Consider cardiology consultation  6.  Acute COPD exacerbation due to pneumonia and influenza: Wean steroids as tolerated, continue nebulizer treatments  7.  Elevated troponin due to demand ischemia: Patient has been ruled out for ACS.   SNF at discharge  Management plans discussed with the patient and she is in agreement.  CODE STATUS: DNR  TOTAL TIME TAKING CARE OF THIS PATIENT: 27 minutes.     POSSIBLE D/C 2-4 days, DEPENDING ON CLINICAL CONDITION.   Shaw Dobek M.D on 07/05/2017 at 11:35 AM  Between 7am to 6pm - Pager - [phone] After 6pm go to [email or web address] - password [email or web address]  [facility]  Office  [phone]  CC: Primary care physician; Patient, No Pcp Per  Note: This dictation was  prepared with Dragon dictation along with smaller phrase technology. Any transcriptional errors that result from this process are unintentional.

## 2017-07-05 NOTE — Consult Note (Signed)
Cardiology Consultation:   Patient ID: Crystal Todd; [id number]; 11/11/1927   Admit date: 06/30/2017 Date of Consult: 07/05/2017  Primary Care Provider: Patient, No Pcp Per Primary Cardiologist: New Primary Electrophysiologist:     Patient Profile:   Crystal Todd is a 84 y.o. female with a hx of COPD asked to see for atrial fib by Dr Juliene Pina  History of Present Illness:   Crystal Todd an 84 yo who was recently admitted for influenza and pneumonia   She has a history of PAF (not on anticoag prior to admit), HTN, HL, COPD (on 2L [place] chronically)  She was admitted on 3/28 with R sided CP, SOB, fevers and chills     Placed on BiPAP on admit   She was hypotensive and tachycardic on admit  Placed on IV fluids and ABX    Patient hsa also been maintained on IV amiodarone   The pt says she had some R sided chest pain   This  Is getting better   Has felt heart racing at times   Some SOB  This is also getting better    Past Medical History:  Diagnosis Date  . Arthritis    hands, knees  . Asthma   . COPD (chronic obstructive pulmonary disease) (HCC)   . Dysrhythmia    A-Fib  . Full dentures    upper and lower  . GERD (gastroesophageal reflux disease)   . Hypercholesteremia   . Hypertension   . Osteoporosis   . Paroxysmal atrial fibrillation (HCC)   . Shortness of breath dyspnea     Past Surgical History:  Procedure Laterality Date  . BACK SURGERY     "cemented" discs  . CATARACT EXTRACTION W/PHACO Left 10/12/2014   Procedure: CATARACT EXTRACTION PHACO AND INTRAOCULAR LENS PLACEMENT (IOC);  Surgeon: Lockie Mola, MD;  Location: [facility];  Service: Ophthalmology;  Laterality: Left;  . ESOPHAGOGASTRODUODENOSCOPY (EGD) WITH ESOPHAGEAL DILATION    . JOINT REPLACEMENT     hip  . WRIST FRACTURE SURGERY Bilateral        Inpatient Medications: Scheduled Meds: . aspirin EC  81 mg Oral Daily  . budesonide (PULMICORT) nebulizer solution  0.25 mg Nebulization BID    . divalproex  125 mg Oral q morning - 10a   And  . divalproex  250 mg Oral QHS  . feeding supplement (ENSURE ENLIVE)  237 mL Oral BID BM  . heparin injection (subcutaneous)  5,000 Units Subcutaneous Q8H  . levalbuterol  0.63 mg Nebulization Q6H  . mouth rinse  15 mL Mouth Rinse q12n4p  . methylPREDNISolone (SOLU-MEDROL) injection  20 mg Intravenous Q12H  . metoprolol tartrate  12.5 mg Oral BID  . oseltamivir  30 mg Oral BID  . pantoprazole  40 mg Oral Daily  . PARoxetine  20 mg Oral Daily   Continuous Infusions: . sodium chloride 50 mL/hr at 07/05/17 1700  . amiodarone 30 mg/hr (07/05/17 1700)  . ceFEPime (MAXIPIME) IV Stopped (07/05/17 1115)   PRN Meds: acetaminophen **OR** acetaminophen, alum & mag hydroxide-simeth, bisacodyl, diphenhydrAMINE, guaiFENesin, HYDROcodone-acetaminophen, ketorolac, levalbuterol, magnesium hydroxide, ondansetron **OR** ondansetron (ZOFRAN) IV, senna-docusate  Allergies:    Allergies  Allergen Reactions  . Ace Inhibitors     Other reaction(s): Other (See Comments) Other Reaction: Other reaction  . Alendronate     Other reaction(s): Other (See Comments) Other Reaction: Other reaction  . Influenza Vaccines Other (See Comments)    "kept the flu for a year"    Social  History:   Social History   Socioeconomic History  . Marital status: Divorced    Spouse name: Not on file  . Number of children: Not on file  . Years of education: Not on file  . Highest education level: Not on file  Occupational History  . Not on file  Social Needs  . Financial resource strain: Not on file  . Food insecurity:    Worry: Not on file    Inability: Not on file  . Transportation needs:    Medical: Not on file    Non-medical: Not on file  Tobacco Use  . Smoking status: Former Smoker    Last attempt to quit: 04/08/1974    Years since quitting: 43.2  . Smokeless tobacco: Never Used  Substance and Sexual Activity  . Alcohol use: No  . Drug use: Not on file  .  Sexual activity: Not on file  Lifestyle  . Physical activity:    Days per week: Not on file    Minutes per session: Not on file  . Stress: Not on file  Relationships  . Social connections:    Talks on phone: Not on file    Gets together: Not on file    Attends religious service: Not on file    Active member of club or organization: Not on file    Attends meetings of clubs or organizations: Not on file    Relationship status: Not on file  . Intimate partner violence:    Fear of current or ex partner: Not on file    Emotionally abused: Not on file    Physically abused: Not on file    Forced sexual activity: Not on file  Other Topics Concern  . Not on file  Social History Narrative  . Not on file    Family History:   FHx is noncontributory  ROS:  Please see the history of present illness.  All other ROS reviewed and negative.     Physical Exam/Data:   Vitals:   07/05/17 1400 07/05/17 1500 07/05/17 1600 07/05/17 1700  BP: 106/67 109/65 106/70 110/69  Pulse: (!) 125 (!) 102 (!) 115 (!) 117  Resp: (!) 40 (!) 25 19 (!) 24  Temp:   97.8 F (36.6 C)   TempSrc:   Oral   SpO2: 95% 97% 98% 96%  Weight:      Height:        Intake/Output Summary (Last 24 hours) at 07/05/2017 1729 Last data filed at 07/05/2017 1700 Gross per 24 hour  Intake 2118.8 ml  Output 625 ml  Net 1493.8 ml   Filed Weights   Jul 16, 2017 2310  Weight: 106 lb 11.2 oz (48.4 kg)   Body mass index is 18.9 kg/m.  General:  Well nourished, well developed, in no acute distress HEENT: normal Lymph: no adenopathy Neck: no JVD Endocrine:  No thryomegaly Vascular: No carotid bruits;2+ DP   Feet warm   Cardiac:  normal S1, S2;  Irreg Irreg   No S3  ; no murmur Lungs: Rhonchi   Rales at bases Abd: soft, nontender, no hepatomegaly  Ext: no edema Musculoskeletal:  No deformities, BUE and BLE strength normal and equal Skin: warm and dry  Neuro:  CNs 2-12 intact, no focal abnormalities noted Psych:  Normal  affect   EKG:  The EKG was personally reviewed and demonstrates:  Atrial fibrillation  109 bpm  Nonspecific ST changes   Telemetry:  Telemetry was personally reviewed and demonstrates:  Atrial  fibrillation   110s   Relevant CV Studies:  Laboratory Data:  Chemistry Recent Labs  Lab 07/02/17 1118 Jul 11, 2017 2017 07/04/17 0549  NA 134* 136 137  K 4.5 4.5 4.7  CL 97* 101 104  CO2 24 22 23   GLUCOSE 117* 161* 179*  BUN 18 23* 28*  CREATININE 0.75 0.93 0.71  CALCIUM 8.5* 7.8* 7.8*  GFRNONAA >60 54* >60  GFRAA >60 >60 >60  ANIONGAP 13 13 10     Recent Labs  Lab 07/02/17 1118 11-Jul-2017 2017  PROT 7.5 6.3*  ALBUMIN 3.6 2.8*  AST 20 21  ALT 21 19  ALKPHOS 90 68  BILITOT 1.0 1.0   Hematology Recent Labs  Lab 07/02/17 1118 07/11/2017 2017 07/04/17 0549  WBC 15.2* 16.4* 9.1  RBC 5.33* 4.64 4.31  HGB 14.4 12.9 11.6*  HCT 44.3 38.6 36.4  MCV 83.1 83.3 84.4  MCH 27.0 27.8 27.0  MCHC 32.5 33.3 32.0  RDW 16.8* 16.1* 16.6*  PLT 218 230 201   Cardiac Enzymes Recent Labs  Lab 07/02/17 1118 07-11-17 2017 07/05/17 0751 07/05/17 1404  TROPONINI <0.03 0.06* <0.03 <0.03   No results for input(s): TROPIPOC in the last 168 hours.  BNP Recent Labs  Lab 07/02/17 1118  BNP 252.0*    DDimer No results for input(s): DDIMER in the last 168 hours.  Radiology/Studies:  Dg Chest 2 View  Result Date: 07/02/2017 CLINICAL DATA:  Generalized weakness. Increasing shortness of breath today. Patient was diagnosed with influenza 06/30/2017. EXAM: CHEST - 2 VIEW COMPARISON:  Single-view of the chest. PA and lateral 06/26/2017 chest 06/17/2013. FINDINGS: There is eventration of the left hemidiaphragm. Heart size is upper normal there is vascular congestion. No consolidative process, pneumothorax or pleural fluid. Linear atelectasis left lung base noted. Aortic atherosclerosis is noted. No acute bony abnormality. Remote left humerus fracture and thoracic spine fractures noted. IMPRESSION:  Cardiomegaly and vascular congestion. Left basilar atelectasis. Atherosclerosis. Electronically Signed   By: 06/28/2017 M.D.   On: 07/02/2017 12:15   Dg Chest Port 1 View  Result Date: 07/05/2017 CLINICAL DATA:  Acute respiratory failure EXAM: PORTABLE CHEST 1 VIEW COMPARISON:  Two days ago FINDINGS: Tall and narrow chest. Retrocardiac opacity persists. Borderline cardiomegaly. No Kerley lines, effusion, or pneumothorax. Remote left humerus and mid clavicle fractures. IMPRESSION: Persistent retrocardiac opacity that could reflect pneumonia, atelectasis, or elevated diaphragm. No new abnormality. Electronically Signed   By: 07/04/2017 M.D.   On: 07/05/2017 08:06   Dg Chest Portable 1 View  Result Date: 07/11/17 CLINICAL DATA:  Weakness EXAM: PORTABLE CHEST 1 VIEW COMPARISON:  07/02/2017, 06/26/2017, 06/18/2011 FINDINGS: Limited by habitus and positioning enlarged cardiomediastinal silhouette with vascular congestion. Possible increased airspace disease at the left base. No obvious pneumothorax. Proximal left humerus fracture again noted IMPRESSION: Limited by habitus and positioning. Possible increased airspace disease at the left base. Cardiomegaly with vascular congestion Electronically Signed   By: 06/28/2017 M.D.   On: Jul 11, 2017 18:50    Assessment and Plan:   Pt is an 84 yo admitted with SOB, fevers, R sided CP   Found to be in afib with RVR   Hx of PAF in past  1   Atrial fib with rapid ventricular response   On amiodarone IV   Rates still high  Duration of afib uncertain   Was not in afib on 3/21   Presented to ED on 3/28 in atrial fib   Recomm:  Stop SQ heparin and asa.  Start IV heparin Continue IV amiodarone for now Increase metoprolol to q  hours   Echo pending    Check TSH       For questions or updates, please contact [facility] Please consult [email or web address] for contact info under Cardiology/STEMI.   Signed, Dietrich Pates, MD  07/05/2017 5:29 PM

## 2017-07-06 ENCOUNTER — Inpatient Hospital Stay (HOSPITAL_COMMUNITY)
Admit: 2017-07-06 | Discharge: 2017-07-06 | Disposition: A | Discharge status: Home / Self Care | Payer: Medicare Other | Attending: Adult Health | Admitting: Adult Health

## 2017-07-06 ENCOUNTER — Inpatient Hospital Stay: Payer: Medicare Other

## 2017-07-06 DIAGNOSIS — I340 Nonrheumatic mitral (valve) insufficiency: Secondary | ICD-10-CM

## 2017-07-06 DIAGNOSIS — L8990 Pressure ulcer of unspecified site, unspecified stage: Secondary | ICD-10-CM

## 2017-07-06 DIAGNOSIS — I361 Nonrheumatic tricuspid (valve) insufficiency: Secondary | ICD-10-CM

## 2017-07-06 DIAGNOSIS — I5031 Acute diastolic (congestive) heart failure: Secondary | ICD-10-CM

## 2017-07-06 LAB — CBC WITH DIFFERENTIAL/PLATELET
BASOS ABS: 0 10*3/uL (ref 0–0.1)
Basophils Relative: 0 %
Eosinophils Absolute: 0 10*3/uL (ref 0–0.7)
Eosinophils Relative: 0 %
HCT: 34.8 % — ABNORMAL LOW (ref 35.0–47.0)
Hemoglobin: 11.1 g/dL — ABNORMAL LOW (ref 12.0–16.0)
LYMPHS ABS: 0.5 10*3/uL — AB (ref 1.0–3.6)
LYMPHS PCT: 4 %
MCH: 26.9 pg (ref 26.0–34.0)
MCHC: 31.8 g/dL — AB (ref 32.0–36.0)
MCV: 84.7 fL (ref 80.0–100.0)
MONO ABS: 0.4 10*3/uL (ref 0.2–0.9)
Monocytes Relative: 3 %
Neutro Abs: 12.3 10*3/uL — ABNORMAL HIGH (ref 1.4–6.5)
Neutrophils Relative %: 93 %
PLATELETS: 202 10*3/uL (ref 150–440)
RBC: 4.11 MIL/uL (ref 3.80–5.20)
RDW: 16.7 % — AB (ref 11.5–14.5)
WBC: 13.3 10*3/uL — ABNORMAL HIGH (ref 3.6–11.0)

## 2017-07-06 LAB — COMPREHENSIVE METABOLIC PANEL
ALT: 52 U/L (ref 14–54)
ANION GAP: 10 (ref 5–15)
AST: 39 U/L (ref 15–41)
Albumin: 2.6 g/dL — ABNORMAL LOW (ref 3.5–5.0)
Alkaline Phosphatase: 66 U/L (ref 38–126)
BUN: 23 mg/dL — ABNORMAL HIGH (ref 6–20)
CHLORIDE: 106 mmol/L (ref 101–111)
CO2: 21 mmol/L — ABNORMAL LOW (ref 22–32)
Calcium: 7.8 mg/dL — ABNORMAL LOW (ref 8.9–10.3)
Creatinine, Ser: 0.75 mg/dL (ref 0.44–1.00)
Glucose, Bld: 207 mg/dL — ABNORMAL HIGH (ref 65–99)
POTASSIUM: 4.6 mmol/L (ref 3.5–5.1)
Sodium: 137 mmol/L (ref 135–145)
Total Bilirubin: 0.4 mg/dL (ref 0.3–1.2)
Total Protein: 5.9 g/dL — ABNORMAL LOW (ref 6.5–8.1)

## 2017-07-06 LAB — HEPARIN LEVEL (UNFRACTIONATED)
HEPARIN UNFRACTIONATED: 0.5 [IU]/mL (ref 0.30–0.70)
HEPARIN UNFRACTIONATED: 0.55 [IU]/mL (ref 0.30–0.70)
Heparin Unfractionated: 1.04 IU/mL — ABNORMAL HIGH (ref 0.30–0.70)

## 2017-07-06 LAB — GLUCOSE, CAPILLARY
Glucose-Capillary: 128 mg/dL — ABNORMAL HIGH (ref 65–99)
Glucose-Capillary: 142 mg/dL — ABNORMAL HIGH (ref 65–99)
Glucose-Capillary: 217 mg/dL — ABNORMAL HIGH (ref 65–99)

## 2017-07-06 LAB — ECHOCARDIOGRAM COMPLETE
Height: 63 in
Weight: 1707.24 oz

## 2017-07-06 LAB — HEMOGLOBIN A1C
HEMOGLOBIN A1C: 5.8 % — AB (ref 4.8–5.6)
Mean Plasma Glucose: 119.76 mg/dL

## 2017-07-06 LAB — VALPROIC ACID LEVEL: Valproic Acid Lvl: 18 ug/mL — ABNORMAL LOW (ref 50.0–100.0)

## 2017-07-06 LAB — MAGNESIUM: MAGNESIUM: 2.4 mg/dL (ref 1.7–2.4)

## 2017-07-06 LAB — PHOSPHORUS: PHOSPHORUS: 2.1 mg/dL — AB (ref 2.5–4.6)

## 2017-07-06 MED ORDER — MORPHINE SULFATE (PF) 2 MG/ML IV SOLN
2.0000 mg | INTRAVENOUS | Status: DC | PRN
  Administered 2017-07-06 – 2017-07-09 (×8): 2 mg via INTRAVENOUS
  Filled 2017-07-06 (×4): qty 1
  Filled 2017-07-06: qty 2
  Filled 2017-07-06 (×2): qty 1

## 2017-07-06 MED ORDER — FUROSEMIDE 10 MG/ML IJ SOLN
40.0000 mg | Freq: Once | INTRAMUSCULAR | Status: AC
Start: 2017-07-06 — End: 2017-07-06
  Administered 2017-07-06: 40 mg via INTRAVENOUS
  Filled 2017-07-06: qty 4

## 2017-07-06 MED ORDER — POTASSIUM & SODIUM PHOSPHATES 280-160-250 MG PO PACK
2.0000 | PACK | Freq: Once | ORAL | Status: DC
  Filled 2017-07-06: qty 2

## 2017-07-06 MED ORDER — HEPARIN (PORCINE) IN NACL 100-0.45 UNIT/ML-% IJ SOLN
1250.0000 [IU]/h | INTRAMUSCULAR | Status: DC
  Administered 2017-07-07: 650 [IU]/h via INTRAVENOUS
  Administered 2017-07-08: 850 [IU]/h via INTRAVENOUS
  Administered 2017-07-11: 1050 [IU]/h via INTRAVENOUS
  Administered 2017-07-12: 1150 [IU]/h via INTRAVENOUS
  Filled 2017-07-06 (×5): qty 250

## 2017-07-06 MED ORDER — K PHOS MONO-SOD PHOS DI & MONO 155-852-130 MG PO TABS
500.0000 mg | ORAL_TABLET | Freq: Once | ORAL | Status: AC
  Administered 2017-07-06: 500 mg via ORAL
  Filled 2017-07-06: qty 2

## 2017-07-06 MED ORDER — INSULIN ASPART 100 UNIT/ML ~~LOC~~ SOLN
0.0000 [IU] | Freq: Three times a day (TID) | SUBCUTANEOUS | Status: DC
  Administered 2017-07-06 – 2017-07-09 (×7): 1 [IU] via SUBCUTANEOUS
  Administered 2017-07-10 (×2): 2 [IU] via SUBCUTANEOUS
  Administered 2017-07-11: 1 [IU] via SUBCUTANEOUS
  Administered 2017-07-11: 2 [IU] via SUBCUTANEOUS
  Administered 2017-07-11 – 2017-07-12 (×2): 1 [IU] via SUBCUTANEOUS
  Filled 2017-07-06 (×9): qty 1

## 2017-07-06 MED ORDER — METOPROLOL TARTRATE 50 MG PO TABS
50.0000 mg | ORAL_TABLET | Freq: Four times a day (QID) | ORAL | Status: DC
  Administered 2017-07-06 – 2017-07-07 (×2): 50 mg via ORAL
  Filled 2017-07-06 (×2): qty 1

## 2017-07-06 MED ORDER — METOPROLOL TARTRATE 25 MG PO TABS
25.0000 mg | ORAL_TABLET | Freq: Two times a day (BID) | ORAL | Status: DC
  Administered 2017-07-06: 25 mg via ORAL

## 2017-07-06 MED ORDER — PIPERACILLIN-TAZOBACTAM 3.375 G IVPB
3.3750 g | Freq: Three times a day (TID) | INTRAVENOUS | Status: DC
  Administered 2017-07-06 – 2017-07-12 (×17): 3.375 g via INTRAVENOUS
  Filled 2017-07-06 (×17): qty 50

## 2017-07-06 MED ORDER — MORPHINE SULFATE (PF) 2 MG/ML IV SOLN
INTRAVENOUS | Status: AC
  Administered 2017-07-06: 2 mg via INTRAVENOUS
  Filled 2017-07-06: qty 1

## 2017-07-06 NOTE — Progress Notes (Signed)
ANTICOAGULATION CONSULT NOTE - Initial Consult  Pharmacy Consult for heparin gtt Indication: atrial fibrillation  Allergies  Allergen Reactions  . Ace Inhibitors     Other reaction(s): Other (See Comments) Other Reaction: Other reaction  . Alendronate     Other reaction(s): Other (See Comments) Other Reaction: Other reaction  . Influenza Vaccines Other (See Comments)    "kept the flu for a year"    Patient Measurements: Height: 5\' 3"  (160 cm) Weight: 106 lb 11.2 oz (48.4 kg) IBW/kg (Calculated) : 52.4 Heparin Dosing Weight: 48.4kg  Vital Signs: Temp: 98.2 F (36.8 C) (03/31 0000) Temp Source: Oral (03/31 0000) BP: 108/74 (03/31 0200) Pulse Rate: 106 (03/31 0200)  Labs: Recent Labs    Jul 22, 2017 2017 2017-07-22 2332 07/04/17 0549 07/05/17 0751 07/05/17 1404 07/05/17 1917 07/05/17 2049 07/06/17 0150  HGB 12.9  --  11.6*  --   --   --   --  11.1*  HCT 38.6  --  36.4  --   --   --   --  34.8*  PLT 230  --  201  --   --   --   --  202  APTT  --  32  --   --   --   --  33  --   LABPROT  --  15.1  --   --   --   --  14.0  --   INR  --  1.20  --   --   --   --  1.09  --   HEPARINUNFRC  --   --   --   --   --   --   --  1.04*  CREATININE 0.93  --  0.71  --   --   --   --  0.75  TROPONINI 0.06*  --   --  <0.03 <0.03 <0.03  --   --     Estimated Creatinine Clearance: 37.9 mL/min (by C-G formula based on SCr of 0.75 mg/dL).   Medical History: Past Medical History:  Diagnosis Date  . Arthritis    hands, knees  . Asthma   . COPD (chronic obstructive pulmonary disease) (HCC)   . Dysrhythmia    A-Fib  . Full dentures    upper and lower  . GERD (gastroesophageal reflux disease)   . Hypercholesteremia   . Hypertension   . Osteoporosis   . Paroxysmal atrial fibrillation (HCC)   . Shortness of breath dyspnea     Medications:  Medications Prior to Admission  Medication Sig Dispense Refill Last Dose  . acetaminophen (TYLENOL) 325 MG tablet Take 650 mg by mouth every  4 (four) hours as needed for fever (up to 101).   PRN at PRN  . albuterol (PROVENTIL HFA;VENTOLIN HFA) 108 (90 Base) MCG/ACT inhaler Inhale 2 puffs into the lungs every 6 (six) hours as needed for wheezing or shortness of breath. 1 Inhaler 2 PRN at PRN  . alum & mag hydroxide-simeth (ANTACID) 200-200-20 MG/5ML suspension Take 30 mLs by mouth every 6 (six) hours as needed for indigestion or heartburn.   PRN at PRN  . amLODipine (NORVASC) 5 MG tablet Take 5 mg by mouth daily.   July 22, 2017 at 0900  . cholecalciferol (VITAMIN D) 1000 units tablet Take 1,000 Units by mouth daily.   2017/07/22 at 0900  . diphenhydrAMINE (BENADRYL) 25 MG tablet Take 25 mg by mouth every 6 (six) hours as needed for allergies.   PRN at PRN  .  divalproex (DEPAKOTE SPRINKLE) 125 MG capsule Take 125-250 mg by mouth 2 (two) times daily. 125 mg every morning and 250 mg at bedtime   08/01/2017 at 0900  . fluticasone (FLONASE) 50 MCG/ACT nasal spray Place 2 sprays into the nose 2 (two) times daily.   2017/08/01 at 0900  . guaiFENesin (ROBITUSSIN) 100 MG/5ML liquid Take 300 mg by mouth every 6 (six) hours as needed for cough.   PRN at PRN  . ipratropium-albuterol (DUONEB) 0.5-2.5 (3) MG/3ML SOLN Take 3 mLs by nebulization every 6 (six) hours as needed (SOB or wheezing). (Patient taking differently: Take 3 mLs by nebulization every 6 (six) hours as needed. For wheezing or shortness of breath) 360 mL 0 PRN at PRN  . loperamide (IMODIUM A-D) 2 MG capsule Take 4 mg by mouth as needed for diarrhea or loose stools.   PRN at PRN  . magnesium hydroxide (MILK OF MAGNESIA) 400 MG/5ML suspension Take 30 mLs by mouth daily as needed for mild constipation.   PRN at PRN  . metoprolol tartrate (LOPRESSOR) 25 MG tablet Take 12.5 mg by mouth 2 (two) times daily.   2017-08-01 at 0900  . multivitamin (PROSIGHT) TABS tablet Take 1 tablet by mouth daily.   08/01/2017 at 0900  . omeprazole (PRILOSEC) 20 MG capsule Take 20 mg by mouth daily.   Aug 01, 2017 at 0900   . oseltamivir (TAMIFLU) 30 MG capsule Take 1 capsule (30 mg total) by mouth 2 (two) times daily. 3 capsule 0 Aug 01, 2017 at 0900  . PARoxetine (PAXIL) 20 MG tablet Take 20 mg by mouth daily.   2017-08-01 at 0900  . predniSONE (DELTASONE) 10 MG tablet Take 10 mg by mouth 4 (four) times daily. For 2 days, then 3 times daily for 2 days, then 2 times daily for 2 days, then daily for 2 days, then stop   08-01-2017 at 1700  . vitamin B-12 (CYANOCOBALAMIN) 1000 MCG tablet Take 1,000 mcg by mouth daily.   08/01/2017 at 0900  . levofloxacin (LEVAQUIN) 250 MG tablet Take 1 tablet (250 mg total) by mouth daily for 7 days. (Patient not taking: Reported on 08/01/17) 7 tablet 0 Not Taking at Unknown time   Scheduled:  . aspirin EC  81 mg Oral Daily  . budesonide (PULMICORT) nebulizer solution  0.25 mg Nebulization BID  . divalproex  125 mg Oral q morning - 10a   And  . divalproex  250 mg Oral QHS  . feeding supplement (ENSURE ENLIVE)  237 mL Oral BID BM  . levalbuterol  0.63 mg Nebulization Q6H  . mouth rinse  15 mL Mouth Rinse q12n4p  . methylPREDNISolone (SOLU-MEDROL) injection  20 mg Intravenous Q12H  . metoprolol tartrate  12.5 mg Oral BID  . oseltamivir  30 mg Oral BID  . pantoprazole  40 mg Oral Daily  . PARoxetine  20 mg Oral Daily   Infusions:  . sodium chloride 50 mL/hr at 07/05/17 1800  . amiodarone 30 mg/hr (07/05/17 1800)  . ceFEPime (MAXIPIME) IV Stopped (07/05/17 2145)  . heparin     PRN: acetaminophen **OR** acetaminophen, alum & mag hydroxide-simeth, bisacodyl, diphenhydrAMINE, guaiFENesin, HYDROcodone-acetaminophen, ketorolac, levalbuterol, magnesium hydroxide, ondansetron **OR** ondansetron (ZOFRAN) IV, senna-docusate Anti-infectives (From admission, onward)   Start     Dose/Rate Route Frequency Ordered Stop   07/04/17 1300  vancomycin (VANCOCIN) IVPB 750 mg/150 ml premix  Status:  Discontinued     750 mg 150 mL/hr over 60 Minutes Intravenous Every 24 hours 08-01-2017 2157 07/04/17  1038  07/04/17 1200  oseltamivir (TAMIFLU) capsule 30 mg     30 mg Oral 2 times daily 07/04/17 1039 07/07/17 0959   07/04/17 0800  ceFEPIme (MAXIPIME) 2 g in sodium chloride 0.9 % 100 mL IVPB     2 g 200 mL/hr over 30 Minutes Intravenous Every 12 hours 2017/07/26 2148     07/26/2017 1845  ceFEPIme (MAXIPIME) 2 g in sodium chloride 0.9 % 100 mL IVPB     2 g 200 mL/hr over 30 Minutes Intravenous  Once 2017-07-26 1836 Jul 26, 2017 2052   07/26/2017 1845  vancomycin (VANCOCIN) IVPB 1000 mg/200 mL premix     1,000 mg 200 mL/hr over 60 Minutes Intravenous  Once 07/26/2017 1836 26-Jul-2017 2207      Assessment: 84 year old man requiring anticoagulation with heparin gtt  Goal of Therapy:  Heparin level 0.3-0.7 units/ml Monitor platelets by anticoagulation protocol: Yes   Plan:  Give 3600 units bolus x 1 Start heparin infusion at 750 units/hr Check anti-Xa level in 6 hours and daily while on heparin Continue to monitor H&H and platelets   03/31 @ 0200 HL 1.04 supratherapeutic. Will hold drip for 1 hour and restart @ 0400 at a rate of 650 units/hr and will recheck HL @ 1200.  Thomasene Ripple, PharmD, BCPS Clinical Pharmacist 07/06/2017

## 2017-07-06 NOTE — Progress Notes (Signed)
ANTICOAGULATION CONSULT NOTE - Initial Consult  Pharmacy Consult for heparin gtt Indication: atrial fibrillation  Allergies  Allergen Reactions  . Ace Inhibitors     Other reaction(s): Other (See Comments) Other Reaction: Other reaction  . Alendronate     Other reaction(s): Other (See Comments) Other Reaction: Other reaction  . Eggs Or Egg-Derived Products Other (See Comments)    Unknown, pt just said that she can't eat eggs due to not being able to take flu vaccine  . Influenza Vaccines Other (See Comments)    "kept the flu for a year"    Patient Measurements: Height: 5\' 3"  (160 cm) Weight: 106 lb 11.2 oz (48.4 kg) IBW/kg (Calculated) : 52.4 Heparin Dosing Weight: 48.4kg  Vital Signs: Temp: 96.2 F (35.7 C) (03/31 1900) Temp Source: Oral (03/31 1900) BP: 114/56 (03/31 2200) Pulse Rate: 117 (03/31 2100)  Labs: Recent Labs    07-20-2017 2332 07/04/17 0549 07/05/17 0751 07/05/17 1404 07/05/17 1917 07/05/17 2049 07/06/17 0150 07/06/17 1330 07/06/17 2141  HGB  --  11.6*  --   --   --   --  11.1*  --   --   HCT  --  36.4  --   --   --   --  34.8*  --   --   PLT  --  201  --   --   --   --  202  --   --   APTT 32  --   --   --   --  33  --   --   --   LABPROT 15.1  --   --   --   --  14.0  --   --   --   INR 1.20  --   --   --   --  1.09  --   --   --   HEPARINUNFRC  --   --   --   --   --   --  1.04* 0.50 0.55  CREATININE  --  0.71  --   --   --   --  0.75  --   --   TROPONINI  --   --  <0.03 <0.03 <0.03  --   --   --   --     Estimated Creatinine Clearance: 37.9 mL/min (by C-G formula based on SCr of 0.75 mg/dL).   Medical History: Past Medical History:  Diagnosis Date  . Arthritis    hands, knees  . Asthma   . COPD (chronic obstructive pulmonary disease) (HCC)   . Dysrhythmia    A-Fib  . Full dentures    upper and lower  . GERD (gastroesophageal reflux disease)   . Hypercholesteremia   . Hypertension   . Osteoporosis   . Paroxysmal atrial  fibrillation (HCC)   . Shortness of breath dyspnea     Medications:  Medications Prior to Admission  Medication Sig Dispense Refill Last Dose  . acetaminophen (TYLENOL) 325 MG tablet Take 650 mg by mouth every 4 (four) hours as needed for fever (up to 101).   PRN at PRN  . albuterol (PROVENTIL HFA;VENTOLIN HFA) 108 (90 Base) MCG/ACT inhaler Inhale 2 puffs into the lungs every 6 (six) hours as needed for wheezing or shortness of breath. 1 Inhaler 2 PRN at PRN  . alum & mag hydroxide-simeth (ANTACID) 200-200-20 MG/5ML suspension Take 30 mLs by mouth every 6 (six) hours as needed for indigestion or heartburn.   PRN  at PRN  . amLODipine (NORVASC) 5 MG tablet Take 5 mg by mouth daily.   06/24/2017 at 0900  . cholecalciferol (VITAMIN D) 1000 units tablet Take 1,000 Units by mouth daily.   06/15/2017 at 0900  . diphenhydrAMINE (BENADRYL) 25 MG tablet Take 25 mg by mouth every 6 (six) hours as needed for allergies.   PRN at PRN  . divalproex (DEPAKOTE SPRINKLE) 125 MG capsule Take 125-250 mg by mouth 2 (two) times daily. 125 mg every morning and 250 mg at bedtime   07/04/2017 at 0900  . fluticasone (FLONASE) 50 MCG/ACT nasal spray Place 2 sprays into the nose 2 (two) times daily.   06/30/2017 at 0900  . guaiFENesin (ROBITUSSIN) 100 MG/5ML liquid Take 300 mg by mouth every 6 (six) hours as needed for cough.   PRN at PRN  . ipratropium-albuterol (DUONEB) 0.5-2.5 (3) MG/3ML SOLN Take 3 mLs by nebulization every 6 (six) hours as needed (SOB or wheezing). (Patient taking differently: Take 3 mLs by nebulization every 6 (six) hours as needed. For wheezing or shortness of breath) 360 mL 0 PRN at PRN  . loperamide (IMODIUM A-D) 2 MG capsule Take 4 mg by mouth as needed for diarrhea or loose stools.   PRN at PRN  . magnesium hydroxide (MILK OF MAGNESIA) 400 MG/5ML suspension Take 30 mLs by mouth daily as needed for mild constipation.   PRN at PRN  . metoprolol tartrate (LOPRESSOR) 25 MG tablet Take 12.5 mg by mouth 2  (two) times daily.   06/17/2017 at 0900  . multivitamin (PROSIGHT) TABS tablet Take 1 tablet by mouth daily.   07/02/2017 at 0900  . omeprazole (PRILOSEC) 20 MG capsule Take 20 mg by mouth daily.   06/06/2017 at 0900  . oseltamivir (TAMIFLU) 30 MG capsule Take 1 capsule (30 mg total) by mouth 2 (two) times daily. 3 capsule 0 06/12/2017 at 0900  . PARoxetine (PAXIL) 20 MG tablet Take 20 mg by mouth daily.   06/20/2017 at 0900  . predniSONE (DELTASONE) 10 MG tablet Take 10 mg by mouth 4 (four) times daily. For 2 days, then 3 times daily for 2 days, then 2 times daily for 2 days, then daily for 2 days, then stop   06/24/2017 at 1700  . vitamin B-12 (CYANOCOBALAMIN) 1000 MCG tablet Take 1,000 mcg by mouth daily.   06/16/2017 at 0900  . levofloxacin (LEVAQUIN) 250 MG tablet Take 1 tablet (250 mg total) by mouth daily for 7 days. (Patient not taking: Reported on 06/30/2017) 7 tablet 0 Not Taking at Unknown time   Scheduled:  . aspirin EC  81 mg Oral Daily  . budesonide (PULMICORT) nebulizer solution  0.25 mg Nebulization BID  . divalproex  125 mg Oral q morning - 10a   And  . divalproex  250 mg Oral QHS  . feeding supplement (ENSURE ENLIVE)  237 mL Oral BID BM  . insulin aspart  0-9 Units Subcutaneous TID WC  . levalbuterol  0.63 mg Nebulization Q6H  . mouth rinse  15 mL Mouth Rinse q12n4p  . metoprolol tartrate  50 mg Oral QID  . pantoprazole  40 mg Oral Daily  . PARoxetine  20 mg Oral Daily   Infusions:  . amiodarone 60 mg/hr (07/06/17 2200)  . heparin 650 Units/hr (07/06/17 1900)  . piperacillin-tazobactam (ZOSYN)  IV     PRN: acetaminophen **OR** acetaminophen, alum & mag hydroxide-simeth, bisacodyl, diphenhydrAMINE, guaiFENesin, HYDROcodone-acetaminophen, ketorolac, levalbuterol, magnesium hydroxide, morphine injection, ondansetron **OR** ondansetron (ZOFRAN) IV,  senna-docusate Anti-infectives (From admission, onward)   Start     Dose/Rate Route Frequency Ordered Stop   07/06/17 2215   piperacillin-tazobactam (ZOSYN) IVPB 3.375 g     3.375 g 12.5 mL/hr over 240 Minutes Intravenous Every 8 hours 07/06/17 2208     07/04/17 1300  vancomycin (VANCOCIN) IVPB 750 mg/150 ml premix  Status:  Discontinued     750 mg 150 mL/hr over 60 Minutes Intravenous Every 24 hours 06/08/2017 2157 07/04/17 1038   07/04/17 1200  oseltamivir (TAMIFLU) capsule 30 mg     30 mg Oral 2 times daily 07/04/17 1039 07/06/17 2116   07/04/17 0800  ceFEPIme (MAXIPIME) 2 g in sodium chloride 0.9 % 100 mL IVPB  Status:  Discontinued     2 g 200 mL/hr over 30 Minutes Intravenous Every 12 hours 07/06/2017 2148 07/06/17 2155   06/11/2017 1845  ceFEPIme (MAXIPIME) 2 g in sodium chloride 0.9 % 100 mL IVPB     2 g 200 mL/hr over 30 Minutes Intravenous  Once 07/02/2017 1836 07/02/2017 2052   06/29/2017 1845  vancomycin (VANCOCIN) IVPB 1000 mg/200 mL premix     1,000 mg 200 mL/hr over 60 Minutes Intravenous  Once 06/11/2017 1836 07/04/2017 2207      Assessment: 84 year old man requiring anticoagulation with heparin gtt  Goal of Therapy:  Heparin level 0.3-0.7 units/ml Monitor platelets by anticoagulation protocol: Yes   Plan:  Heparin level therapeutic. Will continue heparin at same rate and check confirmation level in 8 hours.  03/31 @ 2100 HL 0.55 therapeutic. Will continue current rate and will recheck next anti-Xa w/ am labs.  Thomasene Ripple, PharmD, BCPS Clinical Pharmacist 07/06/2017

## 2017-07-06 NOTE — Progress Notes (Signed)
Pt given dys 1 breakfast tray with thin liquids on it, pt had episode of wet sounding cough and sats dropped to high 80s after drinking orange juice. Tray removed and order put in for SLP eval. Pt made NPO for time being. Pt currently on dys 1 diet due to dentures being left at [facility]. Will continue to monitor.

## 2017-07-06 NOTE — Progress Notes (Signed)
Pharmacy Antibiotic Note  Crystal Todd is a 84 y.o. female admitted on 2017/07/07 with pneumonia.  Pharmacy has been consulted for zosyn dosing.  Plan: Zosyn 3.375g IV q8h (4 hour infusion).  Height: 5\' 3"  (160 cm) Weight: 106 lb 11.2 oz (48.4 kg) IBW/kg (Calculated) : 52.4  Temp (24hrs), Avg:97.8 F (36.6 C), Min:96.2 F (35.7 C), Max:98.4 F (36.9 C)  Recent Labs  Lab 07/02/17 1118 07-Jul-2017 1903 Jul 07, 2017 2008 2017/07/07 2017 07/04/17 0549 07/06/17 0150  WBC 15.2*  --   --  16.4* 9.1 13.3*  CREATININE 0.75  --   --  0.93 0.71 0.75  LATICACIDVEN 1.9 1.7 1.6  --   --   --     Estimated Creatinine Clearance: 37.9 mL/min (by C-G formula based on SCr of 0.75 mg/dL).    Allergies  Allergen Reactions  . Ace Inhibitors     Other reaction(s): Other (See Comments) Other Reaction: Other reaction  . Alendronate     Other reaction(s): Other (See Comments) Other Reaction: Other reaction  . Eggs Or Egg-Derived Products Other (See Comments)    Unknown, pt just said that she can't eat eggs due to not being able to take flu vaccine  . Influenza Vaccines Other (See Comments)    "kept the flu for a year"    Thank you for allowing pharmacy to be a part of this patient's care.  07/08/17, PharmD, BCPS Clinical Pharmacist 07/06/2017

## 2017-07-06 NOTE — Plan of Care (Signed)
During beginning of shift, pt seemed to have aspirated on orange juice. Pt subsequently made NPO pending swallow eval. Pt on and off bipap this shift, then refused to have it put back on despite O2 sats being in the 80s on 6L by [employer]. Pt put on venturi mask at 55% FiO2 and is more comfortable with that, however will desat when she talks or has too much activity. Pt has coarse crackles and rhonchi with a congested sounding cough. Remains in a fib with rates from 90s to 110s.

## 2017-07-06 NOTE — Progress Notes (Signed)
ANTICOAGULATION CONSULT NOTE - Initial Consult  Pharmacy Consult for heparin gtt Indication: atrial fibrillation  Allergies  Allergen Reactions  . Ace Inhibitors     Other reaction(s): Other (See Comments) Other Reaction: Other reaction  . Alendronate     Other reaction(s): Other (See Comments) Other Reaction: Other reaction  . Eggs Or Egg-Derived Products Other (See Comments)    Unknown, pt just said that she can't eat eggs due to not being able to take flu vaccine  . Influenza Vaccines Other (See Comments)    "kept the flu for a year"    Patient Measurements: Height: 5\' 3"  (160 cm) Weight: 106 lb 11.2 oz (48.4 kg) IBW/kg (Calculated) : 52.4 Heparin Dosing Weight: 48.4kg  Vital Signs: Temp: 98.1 F (36.7 C) (03/31 1200) Temp Source: Oral (03/31 1200) BP: 139/75 (03/31 1300) Pulse Rate: 111 (03/31 1300)  Labs: Recent Labs    06/22/2017 2017 06/14/2017 2332 07/04/17 0549 07/05/17 0751 07/05/17 1404 07/05/17 1917 07/05/17 2049 07/06/17 0150 07/06/17 1330  HGB 12.9  --  11.6*  --   --   --   --  11.1*  --   HCT 38.6  --  36.4  --   --   --   --  34.8*  --   PLT 230  --  201  --   --   --   --  202  --   APTT  --  32  --   --   --   --  33  --   --   LABPROT  --  15.1  --   --   --   --  14.0  --   --   INR  --  1.20  --   --   --   --  1.09  --   --   HEPARINUNFRC  --   --   --   --   --   --   --  1.04* 0.50  CREATININE 0.93  --  0.71  --   --   --   --  0.75  --   TROPONINI 0.06*  --   --  <0.03 <0.03 <0.03  --   --   --     Estimated Creatinine Clearance: 37.9 mL/min (by C-G formula based on SCr of 0.75 mg/dL).   Medical History: Past Medical History:  Diagnosis Date  . Arthritis    hands, knees  . Asthma   . COPD (chronic obstructive pulmonary disease) (HCC)   . Dysrhythmia    A-Fib  . Full dentures    upper and lower  . GERD (gastroesophageal reflux disease)   . Hypercholesteremia   . Hypertension   . Osteoporosis   . Paroxysmal atrial  fibrillation (HCC)   . Shortness of breath dyspnea     Medications:  Medications Prior to Admission  Medication Sig Dispense Refill Last Dose  . acetaminophen (TYLENOL) 325 MG tablet Take 650 mg by mouth every 4 (four) hours as needed for fever (up to 101).   PRN at PRN  . albuterol (PROVENTIL HFA;VENTOLIN HFA) 108 (90 Base) MCG/ACT inhaler Inhale 2 puffs into the lungs every 6 (six) hours as needed for wheezing or shortness of breath. 1 Inhaler 2 PRN at PRN  . alum & mag hydroxide-simeth (ANTACID) 200-200-20 MG/5ML suspension Take 30 mLs by mouth every 6 (six) hours as needed for indigestion or heartburn.   PRN at PRN  . amLODipine (NORVASC) 5 MG  tablet Take 5 mg by mouth daily.   07/02/2017 at 0900  . cholecalciferol (VITAMIN D) 1000 units tablet Take 1,000 Units by mouth daily.   06/26/2017 at 0900  . diphenhydrAMINE (BENADRYL) 25 MG tablet Take 25 mg by mouth every 6 (six) hours as needed for allergies.   PRN at PRN  . divalproex (DEPAKOTE SPRINKLE) 125 MG capsule Take 125-250 mg by mouth 2 (two) times daily. 125 mg every morning and 250 mg at bedtime   06/14/2017 at 0900  . fluticasone (FLONASE) 50 MCG/ACT nasal spray Place 2 sprays into the nose 2 (two) times daily.   06/30/2017 at 0900  . guaiFENesin (ROBITUSSIN) 100 MG/5ML liquid Take 300 mg by mouth every 6 (six) hours as needed for cough.   PRN at PRN  . ipratropium-albuterol (DUONEB) 0.5-2.5 (3) MG/3ML SOLN Take 3 mLs by nebulization every 6 (six) hours as needed (SOB or wheezing). (Patient taking differently: Take 3 mLs by nebulization every 6 (six) hours as needed. For wheezing or shortness of breath) 360 mL 0 PRN at PRN  . loperamide (IMODIUM A-D) 2 MG capsule Take 4 mg by mouth as needed for diarrhea or loose stools.   PRN at PRN  . magnesium hydroxide (MILK OF MAGNESIA) 400 MG/5ML suspension Take 30 mLs by mouth daily as needed for mild constipation.   PRN at PRN  . metoprolol tartrate (LOPRESSOR) 25 MG tablet Take 12.5 mg by mouth 2  (two) times daily.   07/04/2017 at 0900  . multivitamin (PROSIGHT) TABS tablet Take 1 tablet by mouth daily.   06/19/2017 at 0900  . omeprazole (PRILOSEC) 20 MG capsule Take 20 mg by mouth daily.   06/27/2017 at 0900  . oseltamivir (TAMIFLU) 30 MG capsule Take 1 capsule (30 mg total) by mouth 2 (two) times daily. 3 capsule 0 06/26/2017 at 0900  . PARoxetine (PAXIL) 20 MG tablet Take 20 mg by mouth daily.   06/28/2017 at 0900  . predniSONE (DELTASONE) 10 MG tablet Take 10 mg by mouth 4 (four) times daily. For 2 days, then 3 times daily for 2 days, then 2 times daily for 2 days, then daily for 2 days, then stop   06/24/2017 at 1700  . vitamin B-12 (CYANOCOBALAMIN) 1000 MCG tablet Take 1,000 mcg by mouth daily.   06/18/2017 at 0900  . levofloxacin (LEVAQUIN) 250 MG tablet Take 1 tablet (250 mg total) by mouth daily for 7 days. (Patient not taking: Reported on 06/26/2017) 7 tablet 0 Not Taking at Unknown time   Scheduled:  . aspirin EC  81 mg Oral Daily  . budesonide (PULMICORT) nebulizer solution  0.25 mg Nebulization BID  . divalproex  125 mg Oral q morning - 10a   And  . divalproex  250 mg Oral QHS  . feeding supplement (ENSURE ENLIVE)  237 mL Oral BID BM  . insulin aspart  0-9 Units Subcutaneous TID WC  . levalbuterol  0.63 mg Nebulization Q6H  . mouth rinse  15 mL Mouth Rinse q12n4p  . methylPREDNISolone (SOLU-MEDROL) injection  20 mg Intravenous Q12H  . metoprolol tartrate  25 mg Oral BID  . oseltamivir  30 mg Oral BID  . pantoprazole  40 mg Oral Daily  . PARoxetine  20 mg Oral Daily   Infusions:  . amiodarone 30 mg/hr (07/06/17 0700)  . ceFEPime (MAXIPIME) IV Stopped (07/06/17 8546)  . heparin 650 Units/hr (07/06/17 0700)   PRN: acetaminophen **OR** acetaminophen, alum & mag hydroxide-simeth, bisacodyl, diphenhydrAMINE, guaiFENesin, HYDROcodone-acetaminophen, ketorolac,  levalbuterol, magnesium hydroxide, ondansetron **OR** ondansetron (ZOFRAN) IV, senna-docusate Anti-infectives (From  admission, onward)   Start     Dose/Rate Route Frequency Ordered Stop   07/04/17 1300  vancomycin (VANCOCIN) IVPB 750 mg/150 ml premix  Status:  Discontinued     750 mg 150 mL/hr over 60 Minutes Intravenous Every 24 hours 06/09/2017 2157 07/04/17 1038   07/04/17 1200  oseltamivir (TAMIFLU) capsule 30 mg     30 mg Oral 2 times daily 07/04/17 1039 07/07/17 0959   07/04/17 0800  ceFEPIme (MAXIPIME) 2 g in sodium chloride 0.9 % 100 mL IVPB     2 g 200 mL/hr over 30 Minutes Intravenous Every 12 hours 07/01/2017 2148     06/29/2017 1845  ceFEPIme (MAXIPIME) 2 g in sodium chloride 0.9 % 100 mL IVPB     2 g 200 mL/hr over 30 Minutes Intravenous  Once 06/09/2017 1836 07/05/2017 2052   06/12/2017 1845  vancomycin (VANCOCIN) IVPB 1000 mg/200 mL premix     1,000 mg 200 mL/hr over 60 Minutes Intravenous  Once 06/11/2017 1836 06/28/2017 2207      Assessment: 84 year old man requiring anticoagulation with heparin gtt  Goal of Therapy:  Heparin level 0.3-0.7 units/ml Monitor platelets by anticoagulation protocol: Yes   Plan:  Heparin level therapeutic. Will continue heparin at same rate and check confirmation level in 8 hours.  Olene Floss, Pharm.D, BCPS Clinical Pharmacist  07/06/2017

## 2017-07-06 NOTE — Progress Notes (Signed)
Progress Note  Patient Name: Crystal Todd Date of Encounter: 07/06/2017  Primary Cardiologist: New   Subjective   Pt's breathing is worse today  No CP   Now on BiPAP  Inpatient Medications    Scheduled Meds: . aspirin EC  81 mg Oral Daily  . budesonide (PULMICORT) nebulizer solution  0.25 mg Nebulization BID  . divalproex  125 mg Oral q morning - 10a   And  . divalproex  250 mg Oral QHS  . feeding supplement (ENSURE ENLIVE)  237 mL Oral BID BM  . insulin aspart  0-9 Units Subcutaneous TID WC  . levalbuterol  0.63 mg Nebulization Q6H  . mouth rinse  15 mL Mouth Rinse q12n4p  . methylPREDNISolone (SOLU-MEDROL) injection  20 mg Intravenous Q12H  . metoprolol tartrate  12.5 mg Oral BID  . oseltamivir  30 mg Oral BID  . pantoprazole  40 mg Oral Daily  . PARoxetine  20 mg Oral Daily   Continuous Infusions: . amiodarone 30 mg/hr (07/06/17 0700)  . ceFEPime (MAXIPIME) IV Stopped (07/06/17 2229)  . heparin 650 Units/hr (07/06/17 0700)   PRN Meds:  Vital Signs    Vitals:   07/06/17 0500 07/06/17 0600 07/06/17 0700 07/06/17 0741  BP: 116/68 (!) 116/92 125/84   Pulse: (!) 108 (!) 105 (!) 106   Resp: (!) 21 (!) 24 (!) 22   Temp:    (!) 97.3 F (36.3 C)  TempSrc:    Axillary  SpO2: 93% 92% 96%   Weight:      Height:        Intake/Output Summary (Last 24 hours) at 07/06/2017 0909 Last data filed at 07/06/2017 0744 Gross per 24 hour  Intake 1829.38 ml  Output 600 ml  Net 1229.38 ml   Filed Weights   07/04/2017 2310  Weight: 106 lb 11.2 oz (48.4 kg)    Telemetry     atrial fib  100s to 110s  - Personally Reviewed  ECG      Physical Exam   GEN: No acute distress.   Neck: JVP increased Cardiac:  Irreg Irreg, no murmurs, rubs, or gallops.  Respiratory: Rhonchi   GI: Soft, nontender, non-distended  MS: No edema; No deformity. Neuro:  Nonfocal  Psych: Normal affect   Labs    Chemistry Recent Labs  Lab 07/02/17 1118 04-Jul-2017 2017 07/04/17 0549  07/06/17 0150  NA 134* 136 137 137  K 4.5 4.5 4.7 4.6  CL 97* 101 104 106  CO2 24 22 23  21*  GLUCOSE 117* 161* 179* 207*  BUN 18 23* 28* 23*  CREATININE 0.75 0.93 0.71 0.75  CALCIUM 8.5* 7.8* 7.8* 7.8*  PROT 7.5 6.3*  --  5.9*  ALBUMIN 3.6 2.8*  --  2.6*  AST 20 21  --  39  ALT 21 19  --  52  ALKPHOS 90 68  --  66  BILITOT 1.0 1.0  --  0.4  GFRNONAA >60 54* >60 >60  GFRAA >60 >60 >60 >60  ANIONGAP 13 13 10 10      Hematology Recent Labs  Lab 07-04-17 2017 07/04/17 0549 07/06/17 0150  WBC 16.4* 9.1 13.3*  RBC 4.64 4.31 4.11  HGB 12.9 11.6* 11.1*  HCT 38.6 36.4 34.8*  MCV 83.3 84.4 84.7  MCH 27.8 27.0 26.9  MCHC 33.3 32.0 31.8*  RDW 16.1* 16.6* 16.7*  PLT 230 201 202    Cardiac Enzymes Recent Labs  Lab 2017/07/04 2017 07/05/17 0751 07/05/17 1404 07/05/17  1917  TROPONINI 0.06* <0.03 <0.03 <0.03   No results for input(s): TROPIPOC in the last 168 hours.   BNP Recent Labs  Lab 07/02/17 1118  BNP 252.0*     DDimer No results for input(s): DDIMER in the last 168 hours.   Radiology    Dg Chest Port 1 View  Result Date: 07/05/2017 CLINICAL DATA:  Acute respiratory failure EXAM: PORTABLE CHEST 1 VIEW COMPARISON:  Two days ago FINDINGS: Tall and narrow chest. Retrocardiac opacity persists. Borderline cardiomegaly. No Kerley lines, effusion, or pneumothorax. Remote left humerus and mid clavicle fractures. IMPRESSION: Persistent retrocardiac opacity that could reflect pneumonia, atelectasis, or elevated diaphragm. No new abnormality. Electronically Signed   By: Marnee Spring M.D.   On: 07/05/2017 08:06    Cardiac Studies     Patient Profile     84 y.o. female admitted with fevers, chills    Found to be in atrial fibrillation  Assessment & Plan    1  Atrial fibrillation  Rates remain uncontrolled   I would recomm increasing amiodarone to 1 mg/min and continue  (pt was in SR on 3/21)  Also increase metoprolol  2  Acute diastolic CHF   LVEF is vigorous    Would give lasix x1      For questions or updates, please contact [facility] Please consult [email or web address] for contact info under Cardiology/STEMI.      Signed, Dietrich Pates, MD  07/06/2017, 9:09 AM

## 2017-07-06 NOTE — Progress Notes (Signed)
[employer] - [place] at [facility]   PATIENT NAME: Crystal Todd    MR#:  [id number]  DATE OF BIRTH:  Nov 13, 1927  SUBJECTIVE:   Patient having SOB this am ++coughing  REVIEW OF SYSTEMS:    Review of Systems  Constitutional: Negative for fever, chills weight loss Positive generalized weakness HENT: Negative for ear pain, nosebleeds, congestion, facial swelling, rhinorrhea, neck pain, neck stiffness and ear discharge.   Respiratory: Positive for cough and shortness of breath no wheezing  cardiovascular: Negative for chest pain, palpitations and leg swelling.  Gastrointestinal: Negative for heartburn, abdominal pain, vomiting, diarrhea or consitpation Genitourinary: Negative for dysuria, urgency, frequency, hematuria Musculoskeletal: Negative for back pain or joint pain Neurological: Negative for dizziness, seizures, syncope, focal weakness,  numbness and headaches.  Hematological: Does not bruise/bleed easily.  Psychiatric/Behavioral: Negative for hallucinations, confusion, dysphoric mood    Tolerating Diet: seems like she is aspirating while I am was in the room      DRUG ALLERGIES:   Allergies  Allergen Reactions  . Ace Inhibitors     Other reaction(s): Other (See Comments) Other Reaction: Other reaction  . Alendronate     Other reaction(s): Other (See Comments) Other Reaction: Other reaction  . Influenza Vaccines Other (See Comments)    "kept the flu for a year"    VITALS:  Blood pressure 125/84, pulse (!) 106, temperature (!) 97.3 F (36.3 C), temperature source Axillary, resp. rate (!) 22, height 5\' 3"  (1.6 m), weight 48.4 kg (106 lb 11.2 oz), SpO2 96 %.  PHYSICAL EXAMINATION:  Constitutional: Appears thin and frail  hENT: Normocephalic. Oropharynx is clear and moist.  Eyes: Conjunctivae and EOM are normal. PERRLA, no scleral icterus.  Neck: Normal ROM. Neck supple. No JVD. No tracheal deviation. CVS: irr, irr tachy , no murmurs, no  gallops, no carotid bruit.  Pulmonary: inc resp effort with coarse breath sounds  Abdominal: Soft. BS +,  no distension, tenderness, rebound or guarding.  Musculoskeletal: Normal range of motion. No edema and no tenderness.  Neuro: Alert. CN 2-12 grossly intact. No focal deficits. Skin: Skin is warm and dry. No rash noted. Psychiatric: Normal mood and affect.      LABORATORY PANEL:   CBC Recent Labs  Lab 07/06/17 0150  WBC 13.3*  HGB 11.1*  HCT 34.8*  PLT 202   ------------------------------------------------------------------------------------------------------------------  Chemistries  Recent Labs  Lab 07/06/17 0150  NA 137  K 4.6  CL 106  CO2 21*  GLUCOSE 207*  BUN 23*  CREATININE 0.75  CALCIUM 7.8*  MG 2.4  AST 39  ALT 52  ALKPHOS 66  BILITOT 0.4   ------------------------------------------------------------------------------------------------------------------  Cardiac Enzymes Recent Labs  Lab 07/05/17 0751 07/05/17 1404 07/05/17 1917  TROPONINI <0.03 <0.03 <0.03   ------------------------------------------------------------------------------------------------------------------  RADIOLOGY:  Dg Chest Port 1 View  Result Date: 07/05/2017 CLINICAL DATA:  Acute respiratory failure EXAM: PORTABLE CHEST 1 VIEW COMPARISON:  Two days ago FINDINGS: Tall and narrow chest. Retrocardiac opacity persists. Borderline cardiomegaly. No Kerley lines, effusion, or pneumothorax. Remote left humerus and mid clavicle fractures. IMPRESSION: Persistent retrocardiac opacity that could reflect pneumonia, atelectasis, or elevated diaphragm. No new abnormality. Electronically Signed   By: 07/07/2017 M.D.   On: 07/05/2017 08:06     ASSESSMENT AND PLAN:   84 year old female with history of COPD, essential hypertension and PAF with recent discharge from the hospital for influenza A and COPD who presented with shortness of breath.  1.  Septic shock with  acute hypoxic  respiratory failure Patient presented with hypotension, tachycardia, leukocytosis and fever  Sepsis was due to pneumonia and influenza A Patient has been weaned off of BiPAP and now on [place] I would be concerned of aspiration on today's visit and would consult speech. Would check CXR I am wondering if she has getting fluid overloaded from elevated heart rate. I will D/c fluids for now  2.HCAP:  Continue cefepime MRSA PCR negative  3.  Influenza A: Intensivist is recommending 10 days of therapy of Tamiflu. 4.  Septic shock: Blood pressure showing some improvement  5.  PAF with RVR Continue amiodarone and heaprin gtt as per Cardiology Follow-up on echo TSH was enl  6.  Acute COPD exacerbation due to pneumonia and influenza: Wean steroids as tolerated, continue nebulizer treatments  7.  Elevated troponin due to demand ischemia: Patient has been ruled out for ACS.  8. Elevated blood sugar: Check A1c and add SSI for now Maybe from steroids.  SNF at discharge  Management plans discussed with the patient and she is in agreement.  CODE STATUS: DNR  TOTAL TIME TAKING CARE OF THIS PATIENT: 27 minutes.   D/w nursing    POSSIBLE D/C 2-4 days, DEPENDING ON CLINICAL CONDITION.   Auria Mckinlay M.D on 07/06/2017 at 8:28 AM  Between 7am to 6pm - Pager - [phone] After 6pm go to [email or web address] - password [email or web address]  [facility]  Office  [phone]  CC: Primary care physician; Patient, No Pcp Per  Note: This dictation was prepared with Dragon dictation along with smaller phrase technology. Any transcriptional errors that result from this process are unintentional.

## 2017-07-06 NOTE — Progress Notes (Signed)
Name: Crystal Todd MRN: [id number] DOB: Mar 01, 1928     CONSULTATION DATE: 2017/07/11 Subjective: Questionable aspiration on liquids  Objectives:  remains on Heparin and Amio gtt.   PAST MEDICAL HISTORY :   has a past medical history of Arthritis, Asthma, COPD (chronic obstructive pulmonary disease) (HCC), Dysrhythmia, Full dentures, GERD (gastroesophageal reflux disease), Hypercholesteremia, Hypertension, Osteoporosis, Paroxysmal atrial fibrillation (HCC), and Shortness of breath dyspnea.  has a past surgical history that includes Back surgery; Esophagogastroduodenoscopy (egd) with esophageal dilation; Joint replacement; Wrist fracture surgery (Bilateral); and Cataract extraction w/PHACO (Left, 10/12/2014). Prior to Admission medications   Medication Sig Start Date End Date Taking? Authorizing Provider  acetaminophen (TYLENOL) 325 MG tablet Take 650 mg by mouth every 4 (four) hours as needed for fever (up to 101).   Yes [provider]  albuterol (PROVENTIL HFA;VENTOLIN HFA) 108 (90 Base) MCG/ACT inhaler Inhale 2 puffs into the lungs every 6 (six) hours as needed for wheezing or shortness of breath. 07/02/17  Yes Willy Eddy, MD  alum & mag hydroxide-simeth (ANTACID) 200-200-20 MG/5ML suspension Take 30 mLs by mouth every 6 (six) hours as needed for indigestion or heartburn.   Yes [provider]  amLODipine (NORVASC) 5 MG tablet Take 5 mg by mouth daily.   Yes [provider]  cholecalciferol (VITAMIN D) 1000 units tablet Take 1,000 Units by mouth daily.   Yes [provider]  diphenhydrAMINE (BENADRYL) 25 MG tablet Take 25 mg by mouth every 6 (six) hours as needed for allergies.   Yes [provider]  divalproex (DEPAKOTE SPRINKLE) 125 MG capsule Take 125-250 mg by mouth 2 (two) times daily. 125 mg every morning and 250 mg at bedtime   Yes [provider]  fluticasone (FLONASE) 50 MCG/ACT nasal spray Place 2 sprays into the  nose 2 (two) times daily.   Yes [provider]  guaiFENesin (ROBITUSSIN) 100 MG/5ML liquid Take 300 mg by mouth every 6 (six) hours as needed for cough.   Yes [provider]  ipratropium-albuterol (DUONEB) 0.5-2.5 (3) MG/3ML SOLN Take 3 mLs by nebulization every 6 (six) hours as needed (SOB or wheezing). Patient taking differently: Take 3 mLs by nebulization every 6 (six) hours as needed. For wheezing or shortness of breath 06/30/17  Yes Sudini, Wardell Heath, MD  loperamide (IMODIUM A-D) 2 MG capsule Take 4 mg by mouth as needed for diarrhea or loose stools.   Yes [provider]  magnesium hydroxide (MILK OF MAGNESIA) 400 MG/5ML suspension Take 30 mLs by mouth daily as needed for mild constipation.   Yes [provider]  metoprolol tartrate (LOPRESSOR) 25 MG tablet Take 12.5 mg by mouth 2 (two) times daily.   Yes [provider]  multivitamin (PROSIGHT) TABS tablet Take 1 tablet by mouth daily.   Yes [provider]  omeprazole (PRILOSEC) 20 MG capsule Take 20 mg by mouth daily.   Yes [provider]  oseltamivir (TAMIFLU) 30 MG capsule Take 1 capsule (30 mg total) by mouth 2 (two) times daily. 06/30/17  Yes Sudini, Wardell Heath, MD  PARoxetine (PAXIL) 20 MG tablet Take 20 mg by mouth daily.   Yes [provider]  predniSONE (DELTASONE) 10 MG tablet Take 10 mg by mouth 4 (four) times daily. For 2 days, then 3 times daily for 2 days, then 2 times daily for 2 days, then daily for 2 days, then stop 07/01/17  Yes [provider]  vitamin B-12 (CYANOCOBALAMIN) 1000 MCG tablet Take 1,000 mcg by  mouth daily.   Yes [provider]  levofloxacin (LEVAQUIN) 250 MG tablet Take 1 tablet (250 mg total) by mouth daily for 7 days. Patient not taking: Reported on 06/18/2017 07/02/17 07/09/17  Willy Eddy, MD   Allergies  Allergen Reactions  . Ace Inhibitors     Other reaction(s): Other (See Comments) Other Reaction: Other reaction    . Alendronate     Other reaction(s): Other (See Comments) Other Reaction: Other reaction  . Eggs Or Egg-Derived Products Other (See Comments)    Unknown, pt just said that she can't eat eggs due to not being able to take flu vaccine  . Influenza Vaccines Other (See Comments)    "kept the flu for a year"    FAMILY HISTORY:  family history is not on file. SOCIAL HISTORY:  reports that she quit smoking about 43 years ago. She has never used smokeless tobacco. She reports that she does not drink alcohol.  REVIEW OF SYSTEMS:   Unable to obtain due to critical illness   VITAL SIGNS: Temp:  [97.3 F (36.3 C)-98.3 F (36.8 C)] 98.1 F (36.7 C) (03/31 1200) Pulse Rate:  [89-141] 99 (03/31 1441) Resp:  [17-35] 30 (03/31 1441) BP: (92-144)/(58-94) 134/94 (03/31 1400) SpO2:  [88 %-98 %] 92 % (03/31 1441) FiO2 (%):  [36 %-50 %] 50 % (03/31 1441)  Physical Examination:  Awake and oriented there is no acute neurological deficits Tolerating nasal cannula/ BiPAP, no distress, able to talk in full sentences, BEAE and no rales S1 + S2 audible no murmur Abdomen was normal peristalsis wasted extremities no edema  ASSESSMENT / PLAN:  Acute hypoxic respiratory failure on chronic respiratory failure baseline home O2 2 L/min -Improved and tolerating nasal cannula with PRN BiPAP. Monitor work of breathing and O2 sat CODE STATUS DNR  Pneumonia.Improved airspace disease -Continue cefepime and s/p vancomycin MRSA PCR negative -Monitor CXR + CBC + FiO2  Flu. -Continue Tamiflu for a total of 10 days of therapy.  Hypotension with septic shock [less likely with pro-calcitonin less than 0.1] and meds.  -Optimize medication,consider pressors if systolic blood pressure less than 90 and continue to monitor hemodynamics  A. fib with RVR on amiodarone drip -Optimize BB -Heparin gtt.  COPD exacerbation -Bronchodilators and taper steroids.  Mild elevation of troponin due to demand versus  supply mismatch -Monitor EKG, echocardiogram and cardiac enzymes. -Start on aspirin.  Anxiety on Paxil and Depakote -Monitor Valproic acid level.   DNR  DVT &GI prophylaxis. Continue with supportive care  Critical care time 35 min

## 2017-07-07 ENCOUNTER — Inpatient Hospital Stay: Payer: Medicare Other

## 2017-07-07 LAB — BASIC METABOLIC PANEL
ANION GAP: 11 (ref 5–15)
BUN: 27 mg/dL — ABNORMAL HIGH (ref 6–20)
CALCIUM: 7.9 mg/dL — AB (ref 8.9–10.3)
CO2: 25 mmol/L (ref 22–32)
Chloride: 103 mmol/L (ref 101–111)
Creatinine, Ser: 0.8 mg/dL (ref 0.44–1.00)
Glucose, Bld: 146 mg/dL — ABNORMAL HIGH (ref 65–99)
POTASSIUM: 4.9 mmol/L (ref 3.5–5.1)
Sodium: 139 mmol/L (ref 135–145)

## 2017-07-07 LAB — CBC WITH DIFFERENTIAL/PLATELET
BASOS ABS: 0 10*3/uL (ref 0–0.1)
BASOS PCT: 0 %
Eosinophils Absolute: 0.1 10*3/uL (ref 0–0.7)
Eosinophils Relative: 0 %
HEMATOCRIT: 37.7 % (ref 35.0–47.0)
Hemoglobin: 12 g/dL (ref 12.0–16.0)
Lymphocytes Relative: 6 %
Lymphs Abs: 1.4 10*3/uL (ref 1.0–3.6)
MCH: 27.1 pg (ref 26.0–34.0)
MCHC: 31.9 g/dL — ABNORMAL LOW (ref 32.0–36.0)
MCV: 84.8 fL (ref 80.0–100.0)
MONO ABS: 1.8 10*3/uL — AB (ref 0.2–0.9)
Monocytes Relative: 7 %
NEUTROS ABS: 22.3 10*3/uL — AB (ref 1.4–6.5)
Neutrophils Relative %: 87 %
PLATELETS: 258 10*3/uL (ref 150–440)
RBC: 4.44 MIL/uL (ref 3.80–5.20)
RDW: 16.9 % — AB (ref 11.5–14.5)
WBC: 25.6 10*3/uL — AB (ref 3.6–11.0)

## 2017-07-07 LAB — BLOOD GAS, ARTERIAL
ACID-BASE EXCESS: 4.9 mmol/L — AB (ref 0.0–2.0)
BICARBONATE: 30.4 mmol/L — AB (ref 20.0–28.0)
FIO2: 100
O2 SAT: 94.4 %
PATIENT TEMPERATURE: 37
pCO2 arterial: 48 mmHg (ref 32.0–48.0)
pH, Arterial: 7.41 (ref 7.350–7.450)
pO2, Arterial: 72 mmHg — ABNORMAL LOW (ref 83.0–108.0)

## 2017-07-07 LAB — GLUCOSE, CAPILLARY
GLUCOSE-CAPILLARY: 78 mg/dL (ref 65–99)
GLUCOSE-CAPILLARY: 72 mg/dL (ref 65–99)
Glucose-Capillary: 140 mg/dL — ABNORMAL HIGH (ref 65–99)
Glucose-Capillary: 101 mg/dL — ABNORMAL HIGH (ref 65–99)

## 2017-07-07 LAB — PHOSPHORUS: PHOSPHORUS: 5.3 mg/dL — AB (ref 2.5–4.6)

## 2017-07-07 LAB — MAGNESIUM: Magnesium: 2.4 mg/dL (ref 1.7–2.4)

## 2017-07-07 LAB — HEPARIN LEVEL (UNFRACTIONATED): HEPARIN UNFRACTIONATED: 0.64 [IU]/mL (ref 0.30–0.70)

## 2017-07-07 MED ORDER — FUROSEMIDE 10 MG/ML IJ SOLN
20.0000 mg | Freq: Once | INTRAMUSCULAR | Status: AC
  Administered 2017-07-07: 20 mg via INTRAVENOUS
  Filled 2017-07-07: qty 2

## 2017-07-07 MED ORDER — DEXTROSE 5 % IV SOLN
INTRAVENOUS | Status: DC
  Administered 2017-07-07: 23:00:00 via INTRAVENOUS

## 2017-07-07 MED ORDER — METOPROLOL TARTRATE 5 MG/5ML IV SOLN
5.0000 mg | Freq: Three times a day (TID) | INTRAVENOUS | Status: DC
  Administered 2017-07-08 (×2): 5 mg via INTRAVENOUS
  Filled 2017-07-07 (×3): qty 5

## 2017-07-07 MED ORDER — PANTOPRAZOLE SODIUM 40 MG IV SOLR
40.0000 mg | Freq: Every day | INTRAVENOUS | Status: DC
Start: 2017-07-07 — End: 2017-07-11
  Administered 2017-07-07 – 2017-07-10 (×4): 40 mg via INTRAVENOUS
  Filled 2017-07-07 (×5): qty 40

## 2017-07-07 MED ORDER — VALPROATE SODIUM 500 MG/5ML IV SOLN
125.0000 mg | Freq: Three times a day (TID) | INTRAVENOUS | Status: DC
  Administered 2017-07-07 – 2017-07-10 (×10): 125 mg via INTRAVENOUS
  Filled 2017-07-07 (×11): qty 1.25

## 2017-07-07 MED ORDER — ALBUMIN HUMAN 25 % IV SOLN
25.0000 g | Freq: Once | INTRAVENOUS | Status: AC
  Administered 2017-07-07: 25 g via INTRAVENOUS
  Filled 2017-07-07: qty 100

## 2017-07-07 NOTE — Progress Notes (Signed)
Name: Crystal Todd MRN: [id number] DOB: 09-09-1927     CONSULTATION DATE: 2017-07-15  Subjective: Aspiration on liquids.  Objective: Doesn't tolerate BiPAP, worsening mental status.     PAST MEDICAL HISTORY :   has a past medical history of Arthritis, Asthma, COPD (chronic obstructive pulmonary disease) (HCC), Dysrhythmia, Full dentures, GERD (gastroesophageal reflux disease), Hypercholesteremia, Hypertension, Osteoporosis, Paroxysmal atrial fibrillation (HCC), and Shortness of breath dyspnea.  has a past surgical history that includes Back surgery; Esophagogastroduodenoscopy (egd) with esophageal dilation; Joint replacement; Wrist fracture surgery (Bilateral); and Cataract extraction w/PHACO (Left, 10/12/2014). Prior to Admission medications   Medication Sig Start Date End Date Taking? Authorizing Provider  acetaminophen (TYLENOL) 325 MG tablet Take 650 mg by mouth every 4 (four) hours as needed for fever (up to 101).   Yes [provider]  albuterol (PROVENTIL HFA;VENTOLIN HFA) 108 (90 Base) MCG/ACT inhaler Inhale 2 puffs into the lungs every 6 (six) hours as needed for wheezing or shortness of breath. 07/02/17  Yes Willy Eddy, MD  alum & mag hydroxide-simeth (ANTACID) 200-200-20 MG/5ML suspension Take 30 mLs by mouth every 6 (six) hours as needed for indigestion or heartburn.   Yes [provider]  amLODipine (NORVASC) 5 MG tablet Take 5 mg by mouth daily.   Yes [provider]  cholecalciferol (VITAMIN D) 1000 units tablet Take 1,000 Units by mouth daily.   Yes [provider]  diphenhydrAMINE (BENADRYL) 25 MG tablet Take 25 mg by mouth every 6 (six) hours as needed for allergies.   Yes [provider]  divalproex (DEPAKOTE SPRINKLE) 125 MG capsule Take 125-250 mg by mouth 2 (two) times daily. 125 mg every morning and 250 mg at bedtime   Yes [provider]  fluticasone (FLONASE) 50 MCG/ACT nasal spray Place 2 sprays  into the nose 2 (two) times daily.   Yes [provider]  guaiFENesin (ROBITUSSIN) 100 MG/5ML liquid Take 300 mg by mouth every 6 (six) hours as needed for cough.   Yes [provider]  ipratropium-albuterol (DUONEB) 0.5-2.5 (3) MG/3ML SOLN Take 3 mLs by nebulization every 6 (six) hours as needed (SOB or wheezing). Patient taking differently: Take 3 mLs by nebulization every 6 (six) hours as needed. For wheezing or shortness of breath 06/30/17  Yes Sudini, Wardell Heath, MD  loperamide (IMODIUM A-D) 2 MG capsule Take 4 mg by mouth as needed for diarrhea or loose stools.   Yes [provider]  magnesium hydroxide (MILK OF MAGNESIA) 400 MG/5ML suspension Take 30 mLs by mouth daily as needed for mild constipation.   Yes [provider]  metoprolol tartrate (LOPRESSOR) 25 MG tablet Take 12.5 mg by mouth 2 (two) times daily.   Yes [provider]  multivitamin (PROSIGHT) TABS tablet Take 1 tablet by mouth daily.   Yes [provider]  omeprazole (PRILOSEC) 20 MG capsule Take 20 mg by mouth daily.   Yes [provider]  oseltamivir (TAMIFLU) 30 MG capsule Take 1 capsule (30 mg total) by mouth 2 (two) times daily. 06/30/17  Yes Sudini, Wardell Heath, MD  PARoxetine (PAXIL) 20 MG tablet Take 20 mg by mouth daily.   Yes [provider]  predniSONE (DELTASONE) 10 MG tablet Take 10 mg by mouth 4 (four) times daily. For 2 days, then 3 times daily for 2 days, then 2 times daily for 2 days, then daily for 2 days, then stop 07/01/17  Yes [provider]  vitamin B-12 (CYANOCOBALAMIN) 1000 MCG tablet Take 1,000 mcg  by mouth daily.   Yes [provider]  levofloxacin (LEVAQUIN) 250 MG tablet Take 1 tablet (250 mg total) by mouth daily for 7 days. Patient not taking: Reported on 08/02/2017 07/02/17 07/09/17  Willy Eddy, MD   Allergies  Allergen Reactions  . Ace Inhibitors     Other reaction(s): Other (See Comments) Other Reaction: Other  reaction  . Alendronate     Other reaction(s): Other (See Comments) Other Reaction: Other reaction  . Eggs Or Egg-Derived Products Other (See Comments)    Unknown, pt just said that she can't eat eggs due to not being able to take flu vaccine  . Influenza Vaccines Other (See Comments)    "kept the flu for a year"    FAMILY HISTORY:  family history is not on file. SOCIAL HISTORY:  reports that she quit smoking about 43 years ago. She has never used smokeless tobacco. She reports that she does not drink alcohol.  REVIEW OF SYSTEMS:   Unable to obtain due to critical illness   VITAL SIGNS: Temp:  [96.2 F (35.7 C)-97.8 F (36.6 C)] 97.8 F (36.6 C) (04/01 1200) Pulse Rate:  [85-123] 91 (04/01 1500) Resp:  [14-35] 15 (04/01 1500) BP: (88-201)/(50-125) 94/59 (04/01 1500) SpO2:  [88 %-100 %] 97 % (04/01 1500) FiO2 (%):  [98 %-100 %] 98 % (04/01 1200)  Physical Examination:  Lethargic and not following commands Tolerating HFNC, no distress, BEAE and diffuse medium crackles. S1 + S2 audible no murmur Abdomen was normal peristalsis wasted extremities no edema  ASSESSMENT / PLAN:  Acute hypoxic respiratory failure on chronic respiratory failure baseline home O2 2 L/min -worsening after aspiration, doesn't tolerate BiPAP, currently on HFNC.  -CODE STATUS DNR and consider comfort measures if develops distress.  Pneumonia with aspiration.worsening airspace disease and pulmonary congestion. -Zosyn  ands/pvancomycin MRSA PCR negative -Diuresis to improve lung compliance. -Monitor CXR + CBC + FiO2  Altered mental status with hypoxia / meds./ toxic metabolic encephalopathy. -Optimize meds., FIO2 and monitor neuro status  Flu. -Continue Tamiflu for a total of 10 days of therapy.  Hypotension with septic shock [less likely with pro-calcitonin less than 0.1] and meds.  -Optimize medication,consider pressors if systolic blood pressure less than 90 and continue to monitor  hemodynamics  A. fib with RVR Off amiodarone drip -Optimize BB -Heparin gtt.  COPD exacerbation -Bronchodilators and taper steroids.  Dysphagia with aspiration -NPO and follow with swallowing evaluation.  Mild elevation of troponin due to demand versus supply mismatch -Monitor EKG, echocardiogram LV EF 65-70% and cardiac enzymes. -Start on aspirin.  Anxiety on Paxil and Depakote -Monitor Valproic acid level.   DNR  DVT &GI prophylaxis. Continue with supportive care  Critical care time 35 min

## 2017-07-07 NOTE — Evaluation (Signed)
Clinical/Bedside Swallow Evaluation Patient Details  Name: Crystal Todd MRN: [id number] Date of Birth: 04-22-1927  Today's Date: 07/07/2017 Time: SLP Start Time (ACUTE ONLY): 1030 SLP Stop Time (ACUTE ONLY): 1045 SLP Time Calculation (min) (ACUTE ONLY): 15 min  Past Medical History:  Past Medical History:  Diagnosis Date  . Arthritis    hands, knees  . Asthma   . COPD (chronic obstructive pulmonary disease) (HCC)   . Dysrhythmia    A-Fib  . Full dentures    upper and lower  . GERD (gastroesophageal reflux disease)   . Hypercholesteremia   . Hypertension   . Osteoporosis   . Paroxysmal atrial fibrillation (HCC)   . Shortness of breath dyspnea    Past Surgical History:  Past Surgical History:  Procedure Laterality Date  . BACK SURGERY     "cemented" discs  . CATARACT EXTRACTION W/PHACO Left 10/12/2014   Procedure: CATARACT EXTRACTION PHACO AND INTRAOCULAR LENS PLACEMENT (IOC);  Surgeon: Lockie Mola, MD;  Location: [facility];  Service: Ophthalmology;  Laterality: Left;  . ESOPHAGOGASTRODUODENOSCOPY (EGD) WITH ESOPHAGEAL DILATION    . JOINT REPLACEMENT     hip  . WRIST FRACTURE SURGERY Bilateral    HPI:  [name]  is a 84 y.o. female with a known history of recent influenza A, hypertension, paroxysmal A. fib, COPD (on 2L [place] chronically), hyperlipidemia and arthritis.  The patient was sent from skilled nursing facility due SOB.  The patient also complains of denies weakness, fever and chills.  She was found hypotension with blood pressures at 80s, tachycardia, hypoxia and leukocytosis.  Chest x-ray show left-sided pneumonia.  She is put on BiPAP, given antibiotics and normal saline bolus. Pt given dys 1 breakfast tray with thin liquids on it, pt had episode of wet sounding cough and sats dropped to high 80s after drinking orange juice. Tray removed and order put in for SLP eval. Pt made NPO for time being.   CXR 4/1 Progression of right upper lobe  infiltrate. Bibasilar airspace disease left greater than right unchanged. Small pleural effusions bilaterally. Cardiac enlargement. Possible heart failure    Assessment / Plan / Recommendation Clinical Impression  Pt presents with suspected severe oropharyngeal and questionably esophageal dysphagia characterized by delayed belching, coughing, and significant expectoration of mucus, small amount of red tinged mucus, and what appeared to be applesauce - ?from medication administration earlier in the day.  Additionally, pt with fluctuating levels of O2 and compromised respiratory status.  Pt is at severe risk for aspiration.  Recommend NPO with oral care QID.  Pt may benefit from MBS when appropriate (pending respiratory status, participation, fatigue) if pt wishes to pursue more aggressive means.  Pt would benefit from palliative care consult to discuss goals of care and pt wishes.  She is unlikely to meet nutritional needs at this time.  SLP will follow for appropriate of diet initiation.  SLP Visit Diagnosis: Dysphagia, oropharyngeal phase (R13.12)    Aspiration Risk  Severe aspiration risk;Risk for inadequate nutrition/hydration    Diet Recommendation NPO   Medication Administration: Via alternative means Supervision: Staff to assist with self feeding;Full supervision/cueing for compensatory strategies Postural Changes: Seated upright at 90 degrees    Other  Recommendations Recommended Consults: Other (Comment)(May benefit from MBS pending GOC.) Oral Care Recommendations: Oral care QID   Follow up Recommendations Other (comment)(pending progress and GOC.)      Frequency and Duration min 4x/week  2 weeks       Prognosis Prognosis  for Safe Diet Advancement: Guarded Barriers to Reach Goals: Severity of deficits      Swallow Study   General Date of Onset: 25-Jul-2017 HPI: Darene Nappi  is a 84 y.o. female with a known history of recent influenza A, hypertension, paroxysmal A. fib, COPD  (on 2L [place] chronically), hyperlipidemia and arthritis.  The patient was sent from skilled nursing facility due SOB.  The patient also complains of denies weakness, fever and chills.  She was found hypotension with blood pressures at 80s, tachycardia, hypoxia and leukocytosis.  Chest x-ray show left-sided pneumonia.  She is put on BiPAP, given antibiotics and normal saline bolus. Pt given dys 1 breakfast tray with thin liquids on it, pt had episode of wet sounding cough and sats dropped to high 80s after drinking orange juice. Tray removed and order put in for SLP eval. Pt made NPO for time being. CXR 4/1 Progression of right upper lobe infiltrate. Bibasilar airspace disease left greater than right unchanged. Small pleural effusions bilaterally. Cardiac enlargement. Possible heart failure  Type of Study: Bedside Swallow Evaluation Diet Prior to this Study: Thin liquids;Dysphagia 1 (puree) Respiratory Status: Nasal cannula;Other (comment)(HFNC) History of Recent Intubation: No Behavior/Cognition: Alert;Cooperative;Lethargic/Drowsy Oral Cavity Assessment: Other (comment)(Slight white coating on tongue) Oral Care Completed by SLP: No Oral Cavity - Dentition: Edentulous Vision: Functional for self-feeding Self-Feeding Abilities: Needs assist;Needs set up Patient Positioning: Upright in bed Baseline Vocal Quality: Wet;Low vocal intensity Volitional Cough: Strong;Other (Comment)(productive) Volitional Swallow: Able to elicit    Oral/Motor/Sensory Function Overall Oral Motor/Sensory Function: Other (comment)(General oral-motor weakness.)   Ice Chips Ice chips: Not tested   Thin Liquid Thin Liquid: Impaired Presentation: Straw Pharyngeal  Phase Impairments: Wet Vocal Quality;Cough - Delayed Other Comments: Pt with varying levels of O2 throughout, changing from mid 80s to 90s with speech.  Pt initially overtly tolerated very small sips of thin liquid.  No changes in voice quality or immediate coughing  noted.  However, after approximately 30 seconds, pt begin to belch several times, followed by expectoration of a significant amount of what appeared to be secretions mixed with applesauce (from med administration earlier?) and a small amount of red tinged mucus.  O2 levels remained low.     Nectar Thick Nectar Thick Liquid: Not tested   Honey Thick Honey Thick Liquid: Not tested   Puree Puree: Not tested   Solid   GO   Solid: Not tested        Benigno Check 07/07/2017,10:51 AM

## 2017-07-07 NOTE — Progress Notes (Signed)
Progress Note  Patient Name: Crystal Todd Date of Encounter: 07/07/2017  Primary Cardiologist: New to [facility] - consult by Tenny Craw  Subjective   Remains SOB on HFNC. Got choked on applesauce this morning. No pills currently. Ventricular rates better controlled. Off amiodarone gtt at this time.   Inpatient Medications    Scheduled Meds: . aspirin EC  81 mg Oral Daily  . budesonide (PULMICORT) nebulizer solution  0.25 mg Nebulization BID  . divalproex  125 mg Oral q morning - 10a   And  . divalproex  250 mg Oral QHS  . feeding supplement (ENSURE ENLIVE)  237 mL Oral BID BM  . insulin aspart  0-9 Units Subcutaneous TID WC  . levalbuterol  0.63 mg Nebulization Q6H  . mouth rinse  15 mL Mouth Rinse q12n4p  . metoprolol tartrate  50 mg Oral QID  . pantoprazole  40 mg Oral Daily  . PARoxetine  20 mg Oral Daily   Continuous Infusions: . amiodarone Stopped (07/07/17 1104)  . heparin 650 Units/hr (07/07/17 0900)  . piperacillin-tazobactam (ZOSYN)  IV Stopped (07/07/17 1021)   PRN Meds: acetaminophen **OR** acetaminophen, alum & mag hydroxide-simeth, bisacodyl, diphenhydrAMINE, guaiFENesin, HYDROcodone-acetaminophen, ketorolac, levalbuterol, magnesium hydroxide, morphine injection, ondansetron **OR** ondansetron (ZOFRAN) IV, senna-docusate   Vital Signs    Vitals:   07/07/17 0600 07/07/17 0700 07/07/17 0800 07/07/17 0957  BP: 108/73 (!) 90/55  107/71  Pulse: 92 86  86  Resp: (!) 24 16    Temp:   (!) 97.5 F (36.4 C)   TempSrc:   Axillary   SpO2: (!) 88% 97%    Weight:      Height:        Intake/Output Summary (Last 24 hours) at 07/07/2017 1108 Last data filed at 07/07/2017 0900 Gross per 24 hour  Intake 601.5 ml  Output 1170 ml  Net -568.5 ml   Filed Weights   07-22-17 2310  Weight: 106 lb 11.2 oz (48.4 kg)    Telemetry    Afib, 70s to 80s bpm - Personally Reviewed  ECG    n/a - Personally Reviewed  Physical Exam   GEN: No acute distress.   Neck: No  JVD. Cardiac: Irregularly irregular, III/VI systolic murmur, no rubs, or gallops.  Respiratory: Diminished breath sounds bilaterally with diffuse rhonchi.  GI: Soft, nontender, non-distended.   MS: No edema; No deformity. Neuro:  Alert and oriented x 3; Nonfocal.  Psych: Normal affect.  Labs    Chemistry Recent Labs  Lab 07/02/17 1118 2017/07/22 2017 07/04/17 0549 07/06/17 0150 07/07/17 0509  NA 134* 136 137 137 139  K 4.5 4.5 4.7 4.6 4.9  CL 97* 101 104 106 103  CO2 24 22 23  21* 25  GLUCOSE 117* 161* 179* 207* 146*  BUN 18 23* 28* 23* 27*  CREATININE 0.75 0.93 0.71 0.75 0.80  CALCIUM 8.5* 7.8* 7.8* 7.8* 7.9*  PROT 7.5 6.3*  --  5.9*  --   ALBUMIN 3.6 2.8*  --  2.6*  --   AST 20 21  --  39  --   ALT 21 19  --  52  --   ALKPHOS 90 68  --  66  --   BILITOT 1.0 1.0  --  0.4  --   GFRNONAA >60 54* >60 >60 >60  GFRAA >60 >60 >60 >60 >60  ANIONGAP 13 13 10 10 11      Hematology Recent Labs  Lab 07/04/17 0549 07/06/17 0150 07/07/17 07/08/17  WBC 9.1 13.3* 25.6*  RBC 4.31 4.11 4.44  HGB 11.6* 11.1* 12.0  HCT 36.4 34.8* 37.7  MCV 84.4 84.7 84.8  MCH 27.0 26.9 27.1  MCHC 32.0 31.8* 31.9*  RDW 16.6* 16.7* 16.9*  PLT 201 202 258    Cardiac Enzymes Recent Labs  Lab 07/04/2017 2017 07/05/17 0751 07/05/17 1404 07/05/17 1917  TROPONINI 0.06* <0.03 <0.03 <0.03   No results for input(s): TROPIPOC in the last 168 hours.   BNP Recent Labs  Lab 07/02/17 1118  BNP 252.0*     DDimer No results for input(s): DDIMER in the last 168 hours.   Radiology    Dg Chest 1 View  Result Date: 07/06/2017 CLINICAL DATA:  Lower lobe aspiration EXAM: CHEST  1 VIEW COMPARISON:  Yesterday FINDINGS: Stable retrocardiac opacity and indistinct less extensive right base opacity. No Kerley lines. No pneumothorax. Chronic cardiomegaly. Chronic multifocal posttraumatic skeletal deformity. IMPRESSION: History of influenza and aspiration with unchanged left more than right lower lung opacities.  Electronically Signed   By: Marnee Spring M.D.   On: 07/06/2017 10:57   Dg Chest Port 1 View  Result Date: 07/07/2017 CLINICAL DATA:  Pneumonia EXAM: PORTABLE CHEST 1 VIEW COMPARISON:  07/06/2017 FINDINGS: Progression of right upper lobe infiltrate. Bibasilar airspace disease left greater than right unchanged. Small pleural effusions bilaterally. Cardiac enlargement. Possible heart failure IMPRESSION: Progression of right upper lobe infiltrate, pneumonia versus edema Bibasilar airspace disease and small effusions unchanged. Electronically Signed   By: Marlan Palau M.D.   On: 07/07/2017 07:56    Cardiac Studies   TTE 07/06/2017:Study Conclusions  - Left ventricle: The cavity size was normal. Wall thickness was   normal. Systolic function was vigorous. The estimated ejection   fraction was in the range of 65% to 70%. Left ventricular   diastolic function parameters were normal. - Mitral valve: There was moderate regurgitation. - Left atrium: The atrium was moderately dilated. - Right atrium: The atrium was mildly dilated. - Tricuspid valve: There was moderate regurgitation. - Pulmonary arteries: PA peak pressure: 50 mm Hg (S).  Patient Profile     84 y.o. female with history of HTN, HLD, OA, and asthma who was admitted to [facility] with influenza PNA and noted to be in Afib with RVR.   Assessment & Plan    1. Newly diagnosed Afib: -Ventricular rates well controlled -Will need to transition from PO Lopressor to IV due to aspiration risk -PCCM has placed her on IV metoprolol 5 mg tid -May need diltiazem gtt if ventricular rates prove difficult to control -Likely in the setting of her PNA -Off amio gtt -Continue heparin gtt, will need DOAC prior to discharge given CHADS2VASc of at least 5 (CHF, HTN, age x 2, female) -Lytes at goal -TSH normal  2. Acute diastolic CHF/pulmonary HTN: -IV Lasix as BP allows   For questions or updates, please contact [facility] Please consult  [email or web address] for contact info under Cardiology/STEMI.    Signed, Eula Listen, PA-C [facility] Pager: [phone] 07/07/2017, 11:08 AM

## 2017-07-07 NOTE — Consult Note (Signed)
WOC Nurse wound consult note Reason for Consult: pressure injuries Wound type: no pressure injuries noted at the time of my assessment Pressure Injury POA: NA Dressing procedure/placement/frequency: Discussed with bedside nurse. Patient will not likely keep prevalon boots in place. Floating heels. Use silicone foam to protect heels.   Discussed POC with patient and bedside nurse.  Re consult if needed, will not follow at this time. Thanks  Joanny Dupree [employer], RN,CWOCN, CNS, CWON-AP [phone])

## 2017-07-07 NOTE — Progress Notes (Signed)
MEDICATION RELATED CONSULT NOTE    Pharmacy Consult for PO to IV medication conversion  Indication: patient failed swallow study   Allergies  Allergen Reactions  . Ace Inhibitors     Other reaction(s): Other (See Comments) Other Reaction: Other reaction  . Alendronate     Other reaction(s): Other (See Comments) Other Reaction: Other reaction  . Eggs Or Egg-Derived Products Other (See Comments)    Unknown, pt just said that she can't eat eggs due to not being able to take flu vaccine  . Influenza Vaccines Other (See Comments)    "kept the flu for a year"    Patient Measurements: Height: 5\' 3"  (160 cm) Weight: 106 lb 11.2 oz (48.4 kg) IBW/kg (Calculated) : 52.4   Vital Signs: Temp: 97.8 F (36.6 C) (04/01 1200) Temp Source: Axillary (04/01 1200) BP: 94/59 (04/01 1500) Pulse Rate: 91 (04/01 1500) Intake/Output from previous day: 03/31 0701 - 04/01 0700 In: 376.7 [I.V.:326.7; IV Piggyback:50] Out: 1470 [Urine:1470] Intake/Output from this shift: Total I/O In: 356.6 [I.V.:356.6] Out: -   Assessment: Pharmacy consulted for medication adjustments for 84 yo female unable to swallow oral medications.   Plan:  Will adjust Depakote to valproic acid 125mg  IV Q8hr.   Will adjust pantoprazole to IV formulation.   Metoprolol previously transitioned by physician.   No other PO to IV transitions available.   Pharmacy will continue to monitor and adjust per consult.   Jaymi Tinner L 07/07/2017,4:44 PM

## 2017-07-07 NOTE — Progress Notes (Signed)
[employer] [facility] [place] at [facility]   PATIENT NAME: Crystal Todd    MR#:  [id number]  DATE OF BIRTH:  07-05-1927  SUBJECTIVE:   Patient with increased work of breathing and hypoxia.  Currently on high flow nasal cannula  REVIEW OF SYSTEMS:    Unable to obtain.  Patient is lethargic this morning   Tolerating Diet: N.p.o.    DRUG ALLERGIES:   Allergies  Allergen Reactions  . Ace Inhibitors     Other reaction(s): Other (See Comments) Other Reaction: Other reaction  . Alendronate     Other reaction(s): Other (See Comments) Other Reaction: Other reaction  . Eggs Or Egg-Derived Products Other (See Comments)    Unknown, pt just said that she can't eat eggs due to not being able to take flu vaccine  . Influenza Vaccines Other (See Comments)    "kept the flu for a year"    VITALS:  Blood pressure 108/73, pulse 92, temperature 97.6 F (36.4 C), temperature source Oral, resp. rate (!) 24, height 5\' 3"  (1.6 m), weight 48.4 kg (106 lb 11.2 oz), SpO2 (!) 88 %.  PHYSICAL EXAMINATION:  Constitutional: Appears thin and frail  hENT: Normocephalic. Oropharynx is clear and moist.  Eyes: Conjunctivae and EOM are normal. PERRLA, no scleral icterus.  Neck: Normal ROM. Neck supple. No JVD. No tracheal deviation. CVS: irr, irr tachy , no murmurs, no gallops, no carotid bruit.  Pulmonary: inc resp effort with coarse breath sounds  Abdominal: Soft. BS +,  no distension, tenderness, rebound or guarding.  Musculoskeletal: Normal range of motion. No edema and no tenderness.  Neuro: Alert. CN 2-12 grossly intact. No focal deficits. Skin: Skin is warm and dry. No rash noted. Psychiatric: Normal mood and affect.      LABORATORY PANEL:   CBC Recent Labs  Lab 07/07/17 0509  WBC 25.6*  HGB 12.0  HCT 37.7  PLT 258   ------------------------------------------------------------------------------------------------------------------  Chemistries  Recent Labs  Lab  07/06/17 0150 07/07/17 0509  NA 137 139  K 4.6 4.9  CL 106 103  CO2 21* 25  GLUCOSE 207* 146*  BUN 23* 27*  CREATININE 0.75 0.80  CALCIUM 7.8* 7.9*  MG 2.4 2.4  AST 39  --   ALT 52  --   ALKPHOS 66  --   BILITOT 0.4  --    ------------------------------------------------------------------------------------------------------------------  Cardiac Enzymes Recent Labs  Lab 07/05/17 0751 07/05/17 1404 07/05/17 1917  TROPONINI <0.03 <0.03 <0.03   ------------------------------------------------------------------------------------------------------------------  RADIOLOGY:  Dg Chest 1 View  Result Date: 07/06/2017 CLINICAL DATA:  Lower lobe aspiration EXAM: CHEST  1 VIEW COMPARISON:  Yesterday FINDINGS: Stable retrocardiac opacity and indistinct less extensive right base opacity. No Kerley lines. No pneumothorax. Chronic cardiomegaly. Chronic multifocal posttraumatic skeletal deformity. IMPRESSION: History of influenza and aspiration with unchanged left more than right lower lung opacities. Electronically Signed   By: 07/08/2017 M.D.   On: 07/06/2017 10:57   Dg Chest Port 1 View  Result Date: 07/07/2017 CLINICAL DATA:  Pneumonia EXAM: PORTABLE CHEST 1 VIEW COMPARISON:  07/06/2017 FINDINGS: Progression of right upper lobe infiltrate. Bibasilar airspace disease left greater than right unchanged. Small pleural effusions bilaterally. Cardiac enlargement. Possible heart failure IMPRESSION: Progression of right upper lobe infiltrate, pneumonia versus edema Bibasilar airspace disease and small effusions unchanged. Electronically Signed   By: 07/08/2017 M.D.   On: 07/07/2017 07:56     ASSESSMENT AND PLAN:   84 year old female with history of COPD, essential  hypertension and PAF with recent discharge from the hospital for influenza A and COPD who presented with shortness of breath.  1.  Septic shock with acute hypoxic respiratory failure Patient presented with hypotension,  tachycardia, leukocytosis and fever  Sepsis is due to pneumonia and influenza A Patient weaned from BiPAP back on nasal cannula, however due to increased respiratory effort is now on high flow nasal cannula There is concern for aspiration causing acute hypoxic respiratory failure. Continue Zosyn for aspiration pneumonia Tamiflu for influenza for a total of 10 days as per intensivist. Wean high flow nasal cannula as tolerated Continue nebulizer treatment Consider Lasix 40 mg IV x1 again for acute diastolic heart failure.   2.  Pneumonia: Initially patient was treated for healthcare acquired pneumonia however it appears now that patient is aspirating.  Antibiotics changed from cefepime to Zosyn (day 2) MRSA PCR negative   3.  Influenza A: Intensivist is recommending 10 days of therapy of Tamiflu. 4.  Septic shock: Blood pressure tolerable  5.  PAF with RVR Continue amiodarone and heaprin gtt as per Cardiology Echo shows normal ejection fraction with moderate mitral regurgitation and elevated PA pressures Appreciate cardiology consultation TSH was enl  6.  Acute COPD exacerbation due to pneumonia and influenza: Patient off of steroids.  Continue levalbuterol   7.  Elevated troponin due to demand ischemia: Patient has been ruled out for ACS.  8. Elevated blood sugar due to steroids A1c is 5.8   9.  Acute diastolic heart failure with preserved ejection fraction: Would advise 40 mg IV Lasix x1 today  SNF at discharge  Patient would benefit from palliative care consultation if no improvement over the next 24 hours.   CODE STATUS: DNR  TOTAL TIME TAKING CARE OF THIS PATIENT: 27 minutes.       POSSIBLE D/C 2-4 days, DEPENDING ON CLINICAL CONDITION.   Enmanuel Zufall M.D on 07/07/2017 at 8:19 AM  Between 7am to 6pm - Pager - [phone] After 6pm go to [email or web address] - password [email or web address]  [facility]  Office  [phone]  CC: Primary care physician; Patient,  No Pcp Per  Note: This dictation was prepared with Dragon dictation along with smaller phrase technology. Any transcriptional errors that result from this process are unintentional.

## 2017-07-07 NOTE — Progress Notes (Signed)
ANTICOAGULATION CONSULT NOTE   Pharmacy Consult for heparin drip Indication: atrial fibrillation  Allergies  Allergen Reactions  . Ace Inhibitors     Other reaction(s): Other (See Comments) Other Reaction: Other reaction  . Alendronate     Other reaction(s): Other (See Comments) Other Reaction: Other reaction  . Eggs Or Egg-Derived Products Other (See Comments)    Unknown, pt just said that she can't eat eggs due to not being able to take flu vaccine  . Influenza Vaccines Other (See Comments)    "kept the flu for a year"    Patient Measurements: Height: 5\' 3"  (160 cm) Weight: 106 lb 11.2 oz (48.4 kg) IBW/kg (Calculated) : 52.4 Heparin Dosing Weight: 48.4kg  Vital Signs: Temp: 97.8 F (36.6 C) (04/01 1200) Temp Source: Axillary (04/01 1200) BP: 94/59 (04/01 1500) Pulse Rate: 91 (04/01 1500)  Labs: Recent Labs    07/05/17 0751 07/05/17 1404 07/05/17 1917 07/05/17 2049  07/06/17 0150 07/06/17 1330 07/06/17 2141 07/07/17 0509  HGB  --   --   --   --   --  11.1*  --   --  12.0  HCT  --   --   --   --   --  34.8*  --   --  37.7  PLT  --   --   --   --   --  202  --   --  258  APTT  --   --   --  33  --   --   --   --   --   LABPROT  --   --   --  14.0  --   --   --   --   --   INR  --   --   --  1.09  --   --   --   --   --   HEPARINUNFRC  --   --   --   --    < > 1.04* 0.50 0.55 0.64  CREATININE  --   --   --   --   --  0.75  --   --  0.80  TROPONINI <0.03 <0.03 <0.03  --   --   --   --   --   --    < > = values in this interval not displayed.    Estimated Creatinine Clearance: 37.9 mL/min (by C-G formula based on SCr of 0.8 mg/dL).   Medical History: Past Medical History:  Diagnosis Date  . Arthritis    hands, knees  . Asthma   . COPD (chronic obstructive pulmonary disease) (HCC)   . Dysrhythmia    A-Fib  . Full dentures    upper and lower  . GERD (gastroesophageal reflux disease)   . Hypercholesteremia   . Hypertension   . Osteoporosis   .  Paroxysmal atrial fibrillation (HCC)   . Shortness of breath dyspnea     Medications:  Scheduled:  . aspirin EC  81 mg Oral Daily  . budesonide (PULMICORT) nebulizer solution  0.25 mg Nebulization BID  . divalproex  125 mg Oral q morning - 10a   And  . divalproex  250 mg Oral QHS  . feeding supplement (ENSURE ENLIVE)  237 mL Oral BID BM  . insulin aspart  0-9 Units Subcutaneous TID WC  . levalbuterol  0.63 mg Nebulization Q6H  . mouth rinse  15 mL Mouth Rinse q12n4p  . metoprolol tartrate  5  mg Intravenous Q8H  . pantoprazole  40 mg Oral Daily  . PARoxetine  20 mg Oral Daily   Infusions:  . amiodarone Stopped (07/07/17 1104)  . heparin 650 Units/hr (07/07/17 1500)  . piperacillin-tazobactam (ZOSYN)  IV 3.375 g (07/07/17 1409)    Assessment: Pharmacy consulted for heparin drip management for 84 yo female on heparin drip for new onset atrial fibrillation. Patient with CHADS2VASc of 5. Patient currently on heparin infusion at 650 units/hr.   Goal of Therapy:  Heparin level 0.3-0.7 units/ml Monitor platelets by anticoagulation protocol: Yes   Plan:  Continue heparin infusion at 650 units/hr. Will recheck anti-Xa with am labs.   Pharmacy will continue to monitor and adjust per consult.   MLS 07/07/2017

## 2017-07-07 NOTE — Progress Notes (Signed)
Met with patient's niece/HCPOA regarding patient's declining health status and BiPAP refusal. Explained to her the risks of patient not having BiPAP. Reviewed all options with her and she agreed to High flow Marceline as patients seem to tolerate it better.She understands that if patient's SPO2 continues to drop while in HFNC, then the next option will be BiPAP. However, she doesn't want BiPAP. She would like patient made full comfort care if she fails HFNC. She has agreed to prn morphine for dyspnea.  All questions answered and chaplain services offered but declined.  Isaic Syler S. [facility] ANP-BC Pulmonary and Critical Care Medicine [facility] Pager [phone] or [phone]  NB: This document was prepared using Dragon voice recognition software and may include unintentional dictation errors.

## 2017-07-07 NOTE — Progress Notes (Signed)
ANTICOAGULATION CONSULT NOTE - Initial Consult  Pharmacy Consult for heparin gtt Indication: atrial fibrillation  Allergies  Allergen Reactions  . Ace Inhibitors     Other reaction(s): Other (See Comments) Other Reaction: Other reaction  . Alendronate     Other reaction(s): Other (See Comments) Other Reaction: Other reaction  . Eggs Or Egg-Derived Products Other (See Comments)    Unknown, pt just said that she can't eat eggs due to not being able to take flu vaccine  . Influenza Vaccines Other (See Comments)    "kept the flu for a year"    Patient Measurements: Height: 5\' 3"  (160 cm) Weight: 106 lb 11.2 oz (48.4 kg) IBW/kg (Calculated) : 52.4 Heparin Dosing Weight: 48.4kg  Vital Signs: Temp: 97.4 F (36.3 C) (04/01 0000) Temp Source: Oral (04/01 0000) BP: 89/55 (04/01 0400) Pulse Rate: 117 (03/31 2100)  Labs: Recent Labs    07/04/17 0549 07/05/17 0751 07/05/17 1404 07/05/17 1917 07/05/17 2049  07/06/17 0150 07/06/17 1330 07/06/17 2141 07/07/17 0509  HGB 11.6*  --   --   --   --   --  11.1*  --   --  12.0  HCT 36.4  --   --   --   --   --  34.8*  --   --  37.7  PLT 201  --   --   --   --   --  202  --   --  258  APTT  --   --   --   --  33  --   --   --   --   --   LABPROT  --   --   --   --  14.0  --   --   --   --   --   INR  --   --   --   --  1.09  --   --   --   --   --   HEPARINUNFRC  --   --   --   --   --    < > 1.04* 0.50 0.55 0.64  CREATININE 0.71  --   --   --   --   --  0.75  --   --  0.80  TROPONINI  --  <0.03 <0.03 <0.03  --   --   --   --   --   --    < > = values in this interval not displayed.    Estimated Creatinine Clearance: 37.9 mL/min (by C-G formula based on SCr of 0.8 mg/dL).   Medical History: Past Medical History:  Diagnosis Date  . Arthritis    hands, knees  . Asthma   . COPD (chronic obstructive pulmonary disease) (HCC)   . Dysrhythmia    A-Fib  . Full dentures    upper and lower  . GERD (gastroesophageal reflux disease)    . Hypercholesteremia   . Hypertension   . Osteoporosis   . Paroxysmal atrial fibrillation (HCC)   . Shortness of breath dyspnea     Medications:  Medications Prior to Admission  Medication Sig Dispense Refill Last Dose  . acetaminophen (TYLENOL) 325 MG tablet Take 650 mg by mouth every 4 (four) hours as needed for fever (up to 101).   PRN at PRN  . albuterol (PROVENTIL HFA;VENTOLIN HFA) 108 (90 Base) MCG/ACT inhaler Inhale 2 puffs into the lungs every 6 (six) hours as needed for wheezing or shortness  of breath. 1 Inhaler 2 PRN at PRN  . alum & mag hydroxide-simeth (ANTACID) 200-200-20 MG/5ML suspension Take 30 mLs by mouth every 6 (six) hours as needed for indigestion or heartburn.   PRN at PRN  . amLODipine (NORVASC) 5 MG tablet Take 5 mg by mouth daily.   07/26/17 at 0900  . cholecalciferol (VITAMIN D) 1000 units tablet Take 1,000 Units by mouth daily.   07/26/2017 at 0900  . diphenhydrAMINE (BENADRYL) 25 MG tablet Take 25 mg by mouth every 6 (six) hours as needed for allergies.   PRN at PRN  . divalproex (DEPAKOTE SPRINKLE) 125 MG capsule Take 125-250 mg by mouth 2 (two) times daily. 125 mg every morning and 250 mg at bedtime   July 26, 2017 at 0900  . fluticasone (FLONASE) 50 MCG/ACT nasal spray Place 2 sprays into the nose 2 (two) times daily.   26-Jul-2017 at 0900  . guaiFENesin (ROBITUSSIN) 100 MG/5ML liquid Take 300 mg by mouth every 6 (six) hours as needed for cough.   PRN at PRN  . ipratropium-albuterol (DUONEB) 0.5-2.5 (3) MG/3ML SOLN Take 3 mLs by nebulization every 6 (six) hours as needed (SOB or wheezing). (Patient taking differently: Take 3 mLs by nebulization every 6 (six) hours as needed. For wheezing or shortness of breath) 360 mL 0 PRN at PRN  . loperamide (IMODIUM A-D) 2 MG capsule Take 4 mg by mouth as needed for diarrhea or loose stools.   PRN at PRN  . magnesium hydroxide (MILK OF MAGNESIA) 400 MG/5ML suspension Take 30 mLs by mouth daily as needed for mild constipation.    PRN at PRN  . metoprolol tartrate (LOPRESSOR) 25 MG tablet Take 12.5 mg by mouth 2 (two) times daily.   26-Jul-2017 at 0900  . multivitamin (PROSIGHT) TABS tablet Take 1 tablet by mouth daily.   26-Jul-2017 at 0900  . omeprazole (PRILOSEC) 20 MG capsule Take 20 mg by mouth daily.   07-26-2017 at 0900  . oseltamivir (TAMIFLU) 30 MG capsule Take 1 capsule (30 mg total) by mouth 2 (two) times daily. 3 capsule 0 July 26, 2017 at 0900  . PARoxetine (PAXIL) 20 MG tablet Take 20 mg by mouth daily.   07/26/17 at 0900  . predniSONE (DELTASONE) 10 MG tablet Take 10 mg by mouth 4 (four) times daily. For 2 days, then 3 times daily for 2 days, then 2 times daily for 2 days, then daily for 2 days, then stop   07/26/2017 at 1700  . vitamin B-12 (CYANOCOBALAMIN) 1000 MCG tablet Take 1,000 mcg by mouth daily.   07/26/2017 at 0900  . levofloxacin (LEVAQUIN) 250 MG tablet Take 1 tablet (250 mg total) by mouth daily for 7 days. (Patient not taking: Reported on 2017-07-26) 7 tablet 0 Not Taking at Unknown time   Scheduled:  . aspirin EC  81 mg Oral Daily  . budesonide (PULMICORT) nebulizer solution  0.25 mg Nebulization BID  . divalproex  125 mg Oral q morning - 10a   And  . divalproex  250 mg Oral QHS  . feeding supplement (ENSURE ENLIVE)  237 mL Oral BID BM  . insulin aspart  0-9 Units Subcutaneous TID WC  . levalbuterol  0.63 mg Nebulization Q6H  . mouth rinse  15 mL Mouth Rinse q12n4p  . metoprolol tartrate  50 mg Oral QID  . pantoprazole  40 mg Oral Daily  . PARoxetine  20 mg Oral Daily   Infusions:  . amiodarone 30.06 mg/hr (07/07/17 0400)  . heparin 650  Units/hr (07/06/17 1900)  . piperacillin-tazobactam (ZOSYN)  IV Stopped (07/07/17 0359)   PRN: acetaminophen **OR** acetaminophen, alum & mag hydroxide-simeth, bisacodyl, diphenhydrAMINE, guaiFENesin, HYDROcodone-acetaminophen, ketorolac, levalbuterol, magnesium hydroxide, morphine injection, ondansetron **OR** ondansetron (ZOFRAN) IV,  senna-docusate Anti-infectives (From admission, onward)   Start     Dose/Rate Route Frequency Ordered Stop   07/06/17 2215  piperacillin-tazobactam (ZOSYN) IVPB 3.375 g     3.375 g 12.5 mL/hr over 240 Minutes Intravenous Every 8 hours 07/06/17 2208     07/04/17 1300  vancomycin (VANCOCIN) IVPB 750 mg/150 ml premix  Status:  Discontinued     750 mg 150 mL/hr over 60 Minutes Intravenous Every 24 hours 2017-07-16 2157 07/04/17 1038   07/04/17 1200  oseltamivir (TAMIFLU) capsule 30 mg     30 mg Oral 2 times daily 07/04/17 1039 07/06/17 2116   07/04/17 0800  ceFEPIme (MAXIPIME) 2 g in sodium chloride 0.9 % 100 mL IVPB  Status:  Discontinued     2 g 200 mL/hr over 30 Minutes Intravenous Every 12 hours Jul 16, 2017 2148 07/06/17 2155   Jul 16, 2017 1845  ceFEPIme (MAXIPIME) 2 g in sodium chloride 0.9 % 100 mL IVPB     2 g 200 mL/hr over 30 Minutes Intravenous  Once 07-16-2017 1836 2017-07-16 2052   2017/07/16 1845  vancomycin (VANCOCIN) IVPB 1000 mg/200 mL premix     1,000 mg 200 mL/hr over 60 Minutes Intravenous  Once July 16, 2017 1836 07/16/2017 2207      Assessment: 84 year old man requiring anticoagulation with heparin gtt  Goal of Therapy:  Heparin level 0.3-0.7 units/ml Monitor platelets by anticoagulation protocol: Yes   Plan:  Heparin level therapeutic. Will continue heparin at same rate and check confirmation level in 8 hours.  03/31 @ 2100 HL 0.55 therapeutic. Will continue current rate and will recheck next anti-Xa w/ am labs.  04/01 @ 0500 HL 0.64 therapeutic. Will continue current rate and will recheck next anti-Xa w/ am labs. CBC stable.  Thomasene Ripple, PharmD, BCPS Clinical Pharmacist 07/07/2017

## 2017-07-07 DEATH — deceased

## 2017-07-08 ENCOUNTER — Inpatient Hospital Stay: Payer: Medicare Other

## 2017-07-08 DIAGNOSIS — I272 Pulmonary hypertension, unspecified: Secondary | ICD-10-CM

## 2017-07-08 DIAGNOSIS — D649 Anemia, unspecified: Secondary | ICD-10-CM

## 2017-07-08 LAB — CULTURE, BLOOD (ROUTINE X 2)
CULTURE: NO GROWTH
CULTURE: NO GROWTH
SPECIAL REQUESTS: ADEQUATE
Special Requests: ADEQUATE

## 2017-07-08 LAB — GLUCOSE, CAPILLARY
GLUCOSE-CAPILLARY: 106 mg/dL — AB (ref 65–99)
GLUCOSE-CAPILLARY: 124 mg/dL — AB (ref 65–99)
Glucose-Capillary: 112 mg/dL — ABNORMAL HIGH (ref 65–99)
Glucose-Capillary: 141 mg/dL — ABNORMAL HIGH (ref 65–99)
Glucose-Capillary: 123 mg/dL — ABNORMAL HIGH (ref 65–99)

## 2017-07-08 LAB — CBC WITH DIFFERENTIAL/PLATELET
BASOS PCT: 0 %
Basophils Absolute: 0 10*3/uL (ref 0–0.1)
EOS ABS: 0 10*3/uL (ref 0–0.7)
EOS PCT: 0 %
HCT: 29.5 % — ABNORMAL LOW (ref 35.0–47.0)
HEMOGLOBIN: 9.9 g/dL — AB (ref 12.0–16.0)
LYMPHS ABS: 1.4 10*3/uL (ref 1.0–3.6)
Lymphocytes Relative: 10 %
MCH: 27.7 pg (ref 26.0–34.0)
MCHC: 33.6 g/dL (ref 32.0–36.0)
MCV: 82.5 fL (ref 80.0–100.0)
MONO ABS: 0.6 10*3/uL (ref 0.2–0.9)
MONOS PCT: 4 %
NEUTROS PCT: 86 %
Neutro Abs: 12.6 10*3/uL — ABNORMAL HIGH (ref 1.4–6.5)
Platelets: 185 10*3/uL (ref 150–440)
RBC: 3.57 MIL/uL — ABNORMAL LOW (ref 3.80–5.20)
RDW: 16.5 % — ABNORMAL HIGH (ref 11.5–14.5)
WBC: 14.6 10*3/uL — ABNORMAL HIGH (ref 3.6–11.0)

## 2017-07-08 LAB — BLOOD GAS, ARTERIAL
Acid-Base Excess: 8 mmol/L — ABNORMAL HIGH (ref 0.0–2.0)
BICARBONATE: 32.7 mmol/L — AB (ref 20.0–28.0)
FIO2: 1
O2 SAT: 89.9 %
PATIENT TEMPERATURE: 37
pCO2 arterial: 46 mmHg (ref 32.0–48.0)
pH, Arterial: 7.46 — ABNORMAL HIGH (ref 7.350–7.450)
pO2, Arterial: 55 mmHg — ABNORMAL LOW (ref 83.0–108.0)

## 2017-07-08 LAB — BASIC METABOLIC PANEL
Anion gap: 7 (ref 5–15)
BUN: 22 mg/dL — AB (ref 6–20)
CO2: 31 mmol/L (ref 22–32)
Calcium: 7.8 mg/dL — ABNORMAL LOW (ref 8.9–10.3)
Chloride: 100 mmol/L — ABNORMAL LOW (ref 101–111)
Creatinine, Ser: 0.84 mg/dL (ref 0.44–1.00)
GFR calc Af Amer: >60 mL/min (ref >60)
Glucose, Bld: 119 mg/dL — ABNORMAL HIGH (ref 65–99)
POTASSIUM: 3.6 mmol/L (ref 3.5–5.1)
SODIUM: 138 mmol/L (ref 135–145)

## 2017-07-08 LAB — PHOSPHORUS: Phosphorus: 3.5 mg/dL (ref 2.5–4.6)

## 2017-07-08 LAB — HEPARIN LEVEL (UNFRACTIONATED)
HEPARIN UNFRACTIONATED: 0.24 [IU]/mL — AB (ref 0.30–0.70)
Heparin Unfractionated: 0.26 IU/mL — ABNORMAL LOW (ref 0.30–0.70)

## 2017-07-08 LAB — MAGNESIUM: MAGNESIUM: 2 mg/dL (ref 1.7–2.4)

## 2017-07-08 LAB — CALCIUM, IONIZED
CALCIUM, IONIZED, SERUM: 4.3 mg/dL — AB (ref 4.5–5.6)
Calcium, Ionized, Serum: 4.2 mg/dL — ABNORMAL LOW (ref 4.5–5.6)

## 2017-07-08 MED ORDER — METOPROLOL TARTRATE 5 MG/5ML IV SOLN
5.0000 mg | INTRAVENOUS | Status: AC
  Filled 2017-07-08: qty 5

## 2017-07-08 MED ORDER — HEPARIN BOLUS VIA INFUSION
700.0000 [IU] | Freq: Once | INTRAVENOUS | Status: AC
  Administered 2017-07-08: 700 [IU] via INTRAVENOUS
  Filled 2017-07-08: qty 700

## 2017-07-08 MED ORDER — VITAL HIGH PROTEIN PO LIQD: 1000.0000 mL | ORAL | Status: DC

## 2017-07-08 MED ORDER — GLUCERNA 1.2 CAL PO LIQD
1000.0000 mL | ORAL | Status: DC
  Administered 2017-07-08 – 2017-07-11 (×2): 1000 mL

## 2017-07-08 MED ORDER — GLUCERNA 1.2 CAL PO LIQD: 1000.0000 mL | ORAL | Status: DC

## 2017-07-08 MED ORDER — FUROSEMIDE 10 MG/ML IJ SOLN
20.0000 mg | Freq: Once | INTRAMUSCULAR | Status: AC
  Administered 2017-07-08: 20 mg via INTRAVENOUS
  Filled 2017-07-08: qty 2

## 2017-07-08 NOTE — Progress Notes (Signed)
Progress Note  Patient Name: Crystal Todd Date of Encounter: 07/08/2017  Primary Cardiologist: New - consult by Dr. Tenny Craw  Subjective   Feels better this morning with less shortness of breath. No CP, edema, or bleeding. Feels heart racing at times.  Inpatient Medications    Scheduled Meds: . aspirin EC  81 mg Oral Daily  . budesonide (PULMICORT) nebulizer solution  0.25 mg Nebulization BID  . feeding supplement (ENSURE ENLIVE)  237 mL Oral BID BM  . insulin aspart  0-9 Units Subcutaneous TID WC  . levalbuterol  0.63 mg Nebulization Q6H  . mouth rinse  15 mL Mouth Rinse q12n4p  . metoprolol tartrate  5 mg Intravenous Q8H  . pantoprazole (PROTONIX) IV  40 mg Intravenous QHS  . PARoxetine  20 mg Oral Daily   Continuous Infusions: . dextrose 75 mL/hr at 07/08/17 0600  . heparin 750 Units/hr (07/08/17 0629)  . piperacillin-tazobactam (ZOSYN)  IV 3.375 g (07/08/17 0542)  . valproate sodium 125 mg (07/08/17 0543)   PRN Meds: acetaminophen **OR** acetaminophen, alum & mag hydroxide-simeth, bisacodyl, guaiFENesin, ketorolac, levalbuterol, magnesium hydroxide, morphine injection, ondansetron **OR** ondansetron (ZOFRAN) IV, senna-docusate   Vital Signs    Vitals:   07/08/17 0300 07/08/17 0400 07/08/17 0500 07/08/17 0600  BP: 98/71 (!) 86/55 (!) 99/59 104/74  Pulse: 96 99 95 99  Resp: 18 20 18  (!) 23  Temp:  98.2 F (36.8 C)    TempSrc:  Oral    SpO2: 95% 96% 92% 96%  Weight:      Height:        Intake/Output Summary (Last 24 hours) at 07/08/2017 0740 Last data filed at 07/08/2017 0543 Gross per 24 hour  Intake 1159.85 ml  Output 1250 ml  Net -90.15 ml   Filed Weights   Jul 26, 2017 2310  Weight: 106 lb 11.2 oz (48.4 kg)    Telemetry    Atrial fibrillation, ventricular rate 80-120 bpm - Personally Reviewed  ECG    No new tracing  Physical Exam   GEN: No acute distress.   Neck: No JVD Cardiac: Irregulary irregular without murmurs. Respiratory: Fair air  movement with diffuse rhonchi and decreased breath sounds at the left base GI: Soft, nontender, non-distended  MS: No edema; No deformity. Neuro:  Nonfocal  Psych: Normal affect   Labs    Chemistry Recent Labs  Lab 07/02/17 1118 07-26-2017 2017  07/06/17 0150 07/07/17 0509 07/08/17 0530  NA 134* 136   < > 137 139 138  K 4.5 4.5   < > 4.6 4.9 3.6  CL 97* 101   < > 106 103 100*  CO2 24 22   < > 21* 25 31  GLUCOSE 117* 161*   < > 207* 146* 119*  BUN 18 23*   < > 23* 27* 22*  CREATININE 0.75 0.93   < > 0.75 0.80 0.84  CALCIUM 8.5* 7.8*   < > 7.8* 7.9* 7.8*  PROT 7.5 6.3*  --  5.9*  --   --   ALBUMIN 3.6 2.8*  --  2.6*  --   --   AST 20 21  --  39  --   --   ALT 21 19  --  52  --   --   ALKPHOS 90 68  --  66  --   --   BILITOT 1.0 1.0  --  0.4  --   --   GFRNONAA >60 54*   < > >60 >  60 >60  GFRAA >60 >60   < > >60 >60 >60  ANIONGAP 13 13   < > 10 11 7    < > = values in this interval not displayed.     Hematology Recent Labs  Lab 07/06/17 0150 07/07/17 0509 07/08/17 0530  WBC 13.3* 25.6* 14.6*  RBC 4.11 4.44 3.57*  HGB 11.1* 12.0 9.9*  HCT 34.8* 37.7 29.5*  MCV 84.7 84.8 82.5  MCH 26.9 27.1 27.7  MCHC 31.8* 31.9* 33.6  RDW 16.7* 16.9* 16.5*  PLT 202 258 185    Cardiac Enzymes Recent Labs  Lab 07/11/2017 2017 07/05/17 0751 07/05/17 1404 07/05/17 1917  TROPONINI 0.06* <0.03 <0.03 <0.03   No results for input(s): TROPIPOC in the last 168 hours.   BNP Recent Labs  Lab 07/02/17 1118  BNP 252.0*     DDimer No results for input(s): DDIMER in the last 168 hours.   Radiology    Dg Chest 1 View  Result Date: 07/06/2017 CLINICAL DATA:  Lower lobe aspiration EXAM: CHEST  1 VIEW COMPARISON:  Yesterday FINDINGS: Stable retrocardiac opacity and indistinct less extensive right base opacity. No Kerley lines. No pneumothorax. Chronic cardiomegaly. Chronic multifocal posttraumatic skeletal deformity. IMPRESSION: History of influenza and aspiration with unchanged left  more than right lower lung opacities. Electronically Signed   By: Marnee Spring M.D.   On: 07/06/2017 10:57   Dg Chest Port 1 View  Result Date: 07/08/2017 CLINICAL DATA:  CHF EXAM: PORTABLE CHEST 1 VIEW COMPARISON:  07/07/2017, 07/06/2017, 07/05/2017, July 11, 2017 FINDINGS: Small left pleural effusion. Stable enlarged cardiomediastinal silhouette. Vascular congestion. Worsened upper lobe airspace disease. No pneumothorax. Prior left proximal humerus fracture old bilateral rib fractures. IMPRESSION: Cardiomegaly with vascular congestion. Worsening airspace disease in the upper lobes, edema versus pneumonia. Small left effusion. Persistent consolidation at the left lung base. Electronically Signed   By: Jasmine Pang M.D.   On: 07/08/2017 02:53   Dg Chest Port 1 View  Result Date: 07/07/2017 CLINICAL DATA:  Pneumonia EXAM: PORTABLE CHEST 1 VIEW COMPARISON:  07/06/2017 FINDINGS: Progression of right upper lobe infiltrate. Bibasilar airspace disease left greater than right unchanged. Small pleural effusions bilaterally. Cardiac enlargement. Possible heart failure IMPRESSION: Progression of right upper lobe infiltrate, pneumonia versus edema Bibasilar airspace disease and small effusions unchanged. Electronically Signed   By: Marlan Palau M.D.   On: 07/07/2017 07:56    Cardiac Studies   TTE (07/06/17): - Left ventricle: The cavity size was normal. Wall thickness was   normal. Systolic function was vigorous. The estimated ejection   fraction was in the range of 65% to 70%. Left ventricular   diastolic function parameters were normal. - Mitral valve: There was moderate regurgitation. - Left atrium: The atrium was moderately dilated. - Right atrium: The atrium was mildly dilated. - Tricuspid valve: There was moderate regurgitation. - Pulmonary arteries: PA peak pressure: 50 mm Hg (S).  Patient Profile     84 y.o. female with history of HTN, HLD, OA, and asthma who was admitted to [facility] with  influenza PNA and noted to be in Afib with RVR.   Assessment & Plan    Newly dx'ed atrial fibrillation with rapid ventricular response HR control remains reasonable (goal < 110 bpm).  Continue IV metoprolol for now; once able to take oral medications, recommend transitioning to metoprolol tartrate 25 mg Q6 hours with titration as needed to achieve goal heart rate.  Continue anticoagulation with heparin gtt while in ICU and unable to take  oral medications. Recommend transitioning to NOAC prior to discharge (CHADSVASC = 5).  Discontinue aspirin, as patient does not have a strong indication for this and has worsening anemia.  Acute diastolic heart failure/pulmonary hypertension Likely precipitated by influenza/PNA and a-fib with RVR Pt appears euvolemic on exam.  Recommend maintaining euvolemia to slightly negative fluid balance.  Anemia Hemoglobin trending down.  Hold aspirin.  Continue heparin infusion, if possible.  Further workup per IM/PCCM  Influenza and pneumonia  Continue antimicrobial therapy and supportive care per IM and PCCM.  For questions or updates, please contact [facility] Please consult [email or web address] for contact info under [facility]     Signed, Yvonne Kendall, MD  07/08/2017, 7:40 AM

## 2017-07-08 NOTE — Progress Notes (Signed)
Name: Crystal Todd MRN: [id number] DOB: 02/20/1928     CONSULTATION DATE: 07/19/2017  Patient continues to require BiPAP / HFNC, hypoglycemia on D5% and NPO because of aspiration. Improved mental status.   PAST MEDICAL HISTORY :   has a past medical history of Arthritis, Asthma, COPD (chronic obstructive pulmonary disease) (HCC), Dysrhythmia, Full dentures, GERD (gastroesophageal reflux disease), Hypercholesteremia, Hypertension, Osteoporosis, Paroxysmal atrial fibrillation (HCC), and Shortness of breath dyspnea.  has a past surgical history that includes Back surgery; Esophagogastroduodenoscopy (egd) with esophageal dilation; Joint replacement; Wrist fracture surgery (Bilateral); and Cataract extraction w/PHACO (Left, 10/12/2014). Prior to Admission medications   Medication Sig Start Date End Date Taking? Authorizing Provider  acetaminophen (TYLENOL) 325 MG tablet Take 650 mg by mouth every 4 (four) hours as needed for fever (up to 101).   Yes [provider]  albuterol (PROVENTIL HFA;VENTOLIN HFA) 108 (90 Base) MCG/ACT inhaler Inhale 2 puffs into the lungs every 6 (six) hours as needed for wheezing or shortness of breath. 07/02/17  Yes Willy Eddy, MD  alum & mag hydroxide-simeth (ANTACID) 200-200-20 MG/5ML suspension Take 30 mLs by mouth every 6 (six) hours as needed for indigestion or heartburn.   Yes [provider]  amLODipine (NORVASC) 5 MG tablet Take 5 mg by mouth daily.   Yes [provider]  cholecalciferol (VITAMIN D) 1000 units tablet Take 1,000 Units by mouth daily.   Yes [provider]  diphenhydrAMINE (BENADRYL) 25 MG tablet Take 25 mg by mouth every 6 (six) hours as needed for allergies.   Yes [provider]  divalproex (DEPAKOTE SPRINKLE) 125 MG capsule Take 125-250 mg by mouth 2 (two) times daily. 125 mg every morning and 250 mg at bedtime   Yes [provider]  fluticasone (FLONASE) 50 MCG/ACT nasal spray  Place 2 sprays into the nose 2 (two) times daily.   Yes [provider]  guaiFENesin (ROBITUSSIN) 100 MG/5ML liquid Take 300 mg by mouth every 6 (six) hours as needed for cough.   Yes [provider]  ipratropium-albuterol (DUONEB) 0.5-2.5 (3) MG/3ML SOLN Take 3 mLs by nebulization every 6 (six) hours as needed (SOB or wheezing). Patient taking differently: Take 3 mLs by nebulization every 6 (six) hours as needed. For wheezing or shortness of breath 06/30/17  Yes Sudini, Wardell Heath, MD  loperamide (IMODIUM A-D) 2 MG capsule Take 4 mg by mouth as needed for diarrhea or loose stools.   Yes [provider]  magnesium hydroxide (MILK OF MAGNESIA) 400 MG/5ML suspension Take 30 mLs by mouth daily as needed for mild constipation.   Yes [provider]  metoprolol tartrate (LOPRESSOR) 25 MG tablet Take 12.5 mg by mouth 2 (two) times daily.   Yes [provider]  multivitamin (PROSIGHT) TABS tablet Take 1 tablet by mouth daily.   Yes [provider]  omeprazole (PRILOSEC) 20 MG capsule Take 20 mg by mouth daily.   Yes [provider]  oseltamivir (TAMIFLU) 30 MG capsule Take 1 capsule (30 mg total) by mouth 2 (two) times daily. 06/30/17  Yes Sudini, Wardell Heath, MD  PARoxetine (PAXIL) 20 MG tablet Take 20 mg by mouth daily.   Yes [provider]  predniSONE (DELTASONE) 10 MG tablet Take 10 mg by mouth 4 (four) times daily. For 2 days, then 3 times daily for 2 days, then 2 times daily for 2 days, then daily for 2 days, then stop 07/01/17  Yes [provider]  vitamin B-12 (CYANOCOBALAMIN) 1000 MCG  tablet Take 1,000 mcg by mouth daily.   Yes [provider]  levofloxacin (LEVAQUIN) 250 MG tablet Take 1 tablet (250 mg total) by mouth daily for 7 days. Patient not taking: Reported on 07-09-2017 07/02/17 07/09/17  Willy Eddy, MD   Allergies  Allergen Reactions  . Ace Inhibitors     Other reaction(s): Other (See Comments) Other  Reaction: Other reaction  . Alendronate     Other reaction(s): Other (See Comments) Other Reaction: Other reaction  . Eggs Or Egg-Derived Products Other (See Comments)    Unknown, pt just said that she can't eat eggs due to not being able to take flu vaccine  . Influenza Vaccines Other (See Comments)    "kept the flu for a year"    FAMILY HISTORY:  family history is not on file. SOCIAL HISTORY:  reports that she quit smoking about 43 years ago. She has never used smokeless tobacco. She reports that she does not drink alcohol.  REVIEW OF SYSTEMS:   Unable to obtain due to critical illness   VITAL SIGNS: Temp:  [98.2 F (36.8 C)-98.8 F (37.1 C)] 98.6 F (37 C) (04/02 1200) Pulse Rate:  [72-113] 108 (04/02 1400) Resp:  [15-25] 24 (04/02 1400) BP: (77-126)/(51-107) 113/66 (04/02 1400) SpO2:  [83 %-98 %] 98 % (04/02 1400) FiO2 (%):  [95 %-100 %] 96 % (04/02 0819)  Physical Examination:  Awake and following commands Tolerating HFNC, no distress, BEAE and diffuse medium crackles (improved). S1 + S2 audible no murmur Abdomen was normal peristalsis wastedextremities no edema   ASSESSMENT / PLAN:  Acute hypoxic respiratory failure on chronic respiratory failure baseline home O2 2 L/min -worsening after aspiration, doesn't tolerate BiPAP, currently on HFNC.  -CODE STATUS DNR and consider comfort measures if develops distress.  Pneumonia with aspiration.worsening airspace disease and pulmonary congestion. -Zosyn  ands/pvancomycin MRSA PCR negative -Diuresis to improve lung compliance. -Monitor CXR + CBC + FiO2  Hypoglycemia with dysphagia. -Place DHT and start on TF -Monitor BG.  Altered mental status with hypoxia / meds./ toxic metabolic encephalopathy. -Optimize meds., FIO2 and monitor neuro status  Flu. -Continue Tamiflu for a total of 10 days of therapy.  Hypotension with septic shock [less likely with pro-calcitonin less than 0.1] and meds.  -Optimize  medication,consider pressors if systolic blood pressure less than 90 and continue to monitor hemodynamics  A. fib with RVR Off amiodarone drip -Optimize BB -Heparin gtt.  COPD exacerbation -Bronchodilators and taper steroids.  Mild elevation of troponin due to demand versus supply mismatch -Monitor EKG, echocardiogram LV EF 65-70% and cardiac enzymes. -Start on aspirin.  Anxiety on Paxil and Depakote -Monitor Valproic acid level.  DNR  DVT &GI prophylaxis. Continue with supportive care  Critical care time 35 min

## 2017-07-08 NOTE — Plan of Care (Signed)
Patient is confused and is currently on high flow nasal cannula. She becomes SOB and desaturates with attempting to answer questions. Attempted to reach POA Brenda at both numbers provided. Message left at cell phone number.

## 2017-07-08 NOTE — Progress Notes (Addendum)
Pt had to be transitioned to Bipap this morning to due SOB, sat in the low 80's and RR of 45. Dophoff placed per MD. Nutrition has been started. At 1830: patient called me into the room and tearfully told me that she wants to go home. Patient is currently alert and oriented. Palliative was unable to get ahold of the niece. I attempted to get ahold of her niece as well, and left a voicemail.

## 2017-07-08 NOTE — Clinical Social Work Note (Signed)
CSW has noted that patient continues to need bipap or high flow [place]. CSW continuing to follow. York Spaniel MSW,LcSW [phone]

## 2017-07-08 NOTE — Progress Notes (Signed)
ANTICOAGULATION CONSULT NOTE   Pharmacy Consult for heparin drip Indication: atrial fibrillation  Allergies  Allergen Reactions  . Ace Inhibitors     Other reaction(s): Other (See Comments) Other Reaction: Other reaction  . Alendronate     Other reaction(s): Other (See Comments) Other Reaction: Other reaction  . Eggs Or Egg-Derived Products Other (See Comments)    Unknown, pt just said that she can't eat eggs due to not being able to take flu vaccine  . Influenza Vaccines Other (See Comments)    "kept the flu for a year"    Patient Measurements: Height: 5\' 3"  (160 cm) Weight: 106 lb 11.2 oz (48.4 kg) IBW/kg (Calculated) : 52.4 Heparin Dosing Weight: 48.4kg  Vital Signs: Temp: 98.2 F (36.8 C) (04/02 0400) Temp Source: Oral (04/02 0400) BP: 99/59 (04/02 0500) Pulse Rate: 95 (04/02 0500)  Labs: Recent Labs    07/05/17 0751 07/05/17 1404 07/05/17 1917 07/05/17 2049  07/06/17 0150  07/06/17 2141 07/07/17 0509 07/08/17 0530  HGB  --   --   --   --    < > 11.1*  --   --  12.0 9.9*  HCT  --   --   --   --   --  34.8*  --   --  37.7 29.5*  PLT  --   --   --   --   --  202  --   --  258 185  APTT  --   --   --  33  --   --   --   --   --   --   LABPROT  --   --   --  14.0  --   --   --   --   --   --   INR  --   --   --  1.09  --   --   --   --   --   --   HEPARINUNFRC  --   --   --   --   --  1.04*   < > 0.55 0.64 0.24*  CREATININE  --   --   --   --   --  0.75  --   --  0.80 0.84  TROPONINI <0.03 <0.03 <0.03  --   --   --   --   --   --   --    < > = values in this interval not displayed.    Estimated Creatinine Clearance: 36.1 mL/min (by C-G formula based on SCr of 0.84 mg/dL).   Medical History: Past Medical History:  Diagnosis Date  . Arthritis    hands, knees  . Asthma   . COPD (chronic obstructive pulmonary disease) (HCC)   . Dysrhythmia    A-Fib  . Full dentures    upper and lower  . GERD (gastroesophageal reflux disease)   . Hypercholesteremia    . Hypertension   . Osteoporosis   . Paroxysmal atrial fibrillation (HCC)   . Shortness of breath dyspnea     Medications:  Scheduled:  . aspirin EC  81 mg Oral Daily  . budesonide (PULMICORT) nebulizer solution  0.25 mg Nebulization BID  . feeding supplement (ENSURE ENLIVE)  237 mL Oral BID BM  . heparin  700 Units Intravenous Once  . insulin aspart  0-9 Units Subcutaneous TID WC  . levalbuterol  0.63 mg Nebulization Q6H  . mouth rinse  15 mL Mouth Rinse  q12n4p  . metoprolol tartrate  5 mg Intravenous Q8H  . pantoprazole (PROTONIX) IV  40 mg Intravenous QHS  . PARoxetine  20 mg Oral Daily   Infusions:  . dextrose 75 mL/hr at 07/08/17 0500  . heparin 650 Units/hr (07/08/17 0500)  . piperacillin-tazobactam (ZOSYN)  IV 3.375 g (07/08/17 0542)  . valproate sodium 125 mg (07/08/17 0543)    Assessment: Pharmacy consulted for heparin drip management for 84 yo female on heparin drip for new onset atrial fibrillation. Patient with CHADS2VASc of 5. Patient currently on heparin infusion at 650 units/hr.   Goal of Therapy:  Heparin level 0.3-0.7 units/ml Monitor platelets by anticoagulation protocol: Yes   Plan:  Continue heparin infusion at 650 units/hr. Will recheck anti-Xa with am labs.   Pharmacy will continue to monitor and adjust per consult.   4/2 AM heparin level 0.24. 700 unit bolus and increase rate to 750 units/hr. Recheck in 8 hours.  Fulton Reek, PharmD, BCPS  07/08/17 6:14 AM

## 2017-07-08 NOTE — Progress Notes (Signed)
[facility] - [place] at [facility]   PATIENT NAME: Crystal Todd    MR#:  [id number]  DATE OF BIRTH:  Jun 06, 1927  SUBJECTIVE:   Patient on HFNC this am HR better  More awake this am   REVIEW OF SYSTEMS:    Review of Systems  Constitutional: Positive for malaise/fatigue. Negative for chills and fever.  HENT: Negative.  Negative for ear discharge, ear pain, hearing loss, nosebleeds and sore throat.   Eyes: Negative.  Negative for blurred vision and pain.  Respiratory: Positive for cough, shortness of breath and wheezing. Negative for hemoptysis.   Cardiovascular: Negative.  Negative for chest pain, palpitations and leg swelling.  Gastrointestinal: Negative.  Negative for abdominal pain, blood in stool, diarrhea, nausea and vomiting.  Genitourinary: Negative.  Negative for dysuria.  Musculoskeletal: Negative.  Negative for back pain.  Skin: Negative.   Neurological: Negative for dizziness, tremors, speech change, focal weakness, seizures and headaches.  Endo/Heme/Allergies: Negative.  Does not bruise/bleed easily.  Psychiatric/Behavioral: Negative.  Negative for depression, hallucinations and suicidal ideas.      Tolerating Diet: N.p.o.    DRUG ALLERGIES:   Allergies  Allergen Reactions  . Ace Inhibitors     Other reaction(s): Other (See Comments) Other Reaction: Other reaction  . Alendronate     Other reaction(s): Other (See Comments) Other Reaction: Other reaction  . Eggs Or Egg-Derived Products Other (See Comments)    Unknown, pt just said that she can't eat eggs due to not being able to take flu vaccine  . Influenza Vaccines Other (See Comments)    "kept the flu for a year"    VITALS:  Blood pressure 104/74, pulse 99, temperature 98.2 F (36.8 C), temperature source Oral, resp. rate (!) 23, height 5\' 3"  (1.6 m), weight 48.4 kg (106 lb 11.2 oz), SpO2 96 %.  PHYSICAL EXAMINATION:  Constitutional: Appears thin and frail  hENT:  Normocephalic. Oropharynx is clear and moist.  Eyes: Conjunctivae and EOM are normal. PERRLA, no scleral icterus.  Neck: Normal ROM. Neck supple. No JVD. No tracheal deviation. CVS: irr, irr tachy , no murmurs, no gallops, no carotid bruit.  Pulmonary: b/l coarse rhonchi  Abdominal: Soft. BS +,  no distension, tenderness, rebound or guarding.  Musculoskeletal: Normal range of motion. No edema and no tenderness.  Neuro: Alert. +generalized weakness. No focal deficits. Skin: Skin is warm and dry. No rash noted. Psychiatric:anxious     LABORATORY PANEL:   CBC Recent Labs  Lab 07/08/17 0530  WBC 14.6*  HGB 9.9*  HCT 29.5*  PLT 185   ------------------------------------------------------------------------------------------------------------------  Chemistries  Recent Labs  Lab 07/06/17 0150  07/08/17 0530  NA 137   < > 138  K 4.6   < > 3.6  CL 106   < > 100*  CO2 21*   < > 31  GLUCOSE 207*   < > 119*  BUN 23*   < > 22*  CREATININE 0.75   < > 0.84  CALCIUM 7.8*   < > 7.8*  MG 2.4   < > 2.0  AST 39  --   --   ALT 52  --   --   ALKPHOS 66  --   --   BILITOT 0.4  --   --    < > = values in this interval not displayed.   ------------------------------------------------------------------------------------------------------------------  Cardiac Enzymes Recent Labs  Lab 07/05/17 0751 07/05/17 1404 07/05/17 1917  TROPONINI <0.03 <0.03 <0.03   ------------------------------------------------------------------------------------------------------------------  RADIOLOGY:  Dg Chest 1 View  Result Date: 07/06/2017 CLINICAL DATA:  Lower lobe aspiration EXAM: CHEST  1 VIEW COMPARISON:  Yesterday FINDINGS: Stable retrocardiac opacity and indistinct less extensive right base opacity. No Kerley lines. No pneumothorax. Chronic cardiomegaly. Chronic multifocal posttraumatic skeletal deformity. IMPRESSION: History of influenza and aspiration with unchanged left more than right  lower lung opacities. Electronically Signed   By: Marnee Spring M.D.   On: 07/06/2017 10:57   Dg Chest Port 1 View  Result Date: 07/08/2017 CLINICAL DATA:  CHF EXAM: PORTABLE CHEST 1 VIEW COMPARISON:  07/07/2017, 07/06/2017, 07/05/2017, 06/28/2017 FINDINGS: Small left pleural effusion. Stable enlarged cardiomediastinal silhouette. Vascular congestion. Worsened upper lobe airspace disease. No pneumothorax. Prior left proximal humerus fracture old bilateral rib fractures. IMPRESSION: Cardiomegaly with vascular congestion. Worsening airspace disease in the upper lobes, edema versus pneumonia. Small left effusion. Persistent consolidation at the left lung base. Electronically Signed   By: Jasmine Pang M.D.   On: 07/08/2017 02:53   Dg Chest Port 1 View  Result Date: 07/07/2017 CLINICAL DATA:  Pneumonia EXAM: PORTABLE CHEST 1 VIEW COMPARISON:  07/06/2017 FINDINGS: Progression of right upper lobe infiltrate. Bibasilar airspace disease left greater than right unchanged. Small pleural effusions bilaterally. Cardiac enlargement. Possible heart failure IMPRESSION: Progression of right upper lobe infiltrate, pneumonia versus edema Bibasilar airspace disease and small effusions unchanged. Electronically Signed   By: Marlan Palau M.D.   On: 07/07/2017 07:56     ASSESSMENT AND PLAN:   84 year old female with history of COPD, essential hypertension and PAF with recent discharge from the hospital for influenza A and COPD who presented with shortness of breath.  1.  Septic shock with acute hypoxic respiratory failure Patient presented with hypotension, tachycardia, leukocytosis and fever  Sepsis is due to pneumonia and influenza A Patient weaned from BiPAP back on nasal cannula, however due to increased respiratory effort is now on high flow nasal cannula There is concern for aspiration causing acute hypoxic respiratory failure. Continue Zosyn for aspiration pneumonia Tamiflu for influenza for a total of 10  days as per intensivist. Wean high flow nasal cannula as tolerated Continue nebulizer treatment NPO for high risk of aspiration as per Speech consult   2.  Pneumonia: Initially patient was treated for healthcare acquired pneumonia however it appears now that patient is aspirating.  Antibiotics changed from cefepime to Zosyn (day 3) MRSA PCR negative   3.  Influenza A: Intensivist is recommending 10 days of therapy of Tamiflu. 4.  Septic shock: Blood pressure tolerable  5.  PAF with RVR Continue amiodarone and heparin gtt as per Cardiology Echo shows normal ejection fraction with moderate mitral regurgitation and elevated PA pressures Appreciate cardiology consultation TSH was wnml Monitor Hgb   6.  Acute COPD exacerbation due to pneumonia and influenza: Patient off of steroids.  Continue levalbuterol   7.  Elevated troponin due to demand ischemia: Patient has been ruled out for ACS.  8. Elevated blood sugar due to steroids A1c is 5.8   9.  Acute diastolic heart failure with preserved ejection fraction: Patient Euvolemic  10. Aspiration risk: Patient is NPO and high risk of aspiration. She is DNR  Palliative care consult advised with possible Hospice.     CODE STATUS: DNR  TOTAL TIME TAKING CARE OF THIS PATIENT: 24 minutes.       POSSIBLE D/C?? DEPENDING ON CLINICAL CONDITION.   Motty Borin M.D on 07/08/2017 at 7:59 AM  Between 7am to 6pm - Pager -  [phone] After 6pm go to [email or web address] - password [email or web address]  [facility]  Office  [phone]  CC: Primary care physician; Patient, No Pcp Per  Note: This dictation was prepared with Dragon dictation along with smaller phrase technology. Any transcriptional errors that result from this process are unintentional.

## 2017-07-08 NOTE — Progress Notes (Signed)
ANTICOAGULATION CONSULT NOTE   Pharmacy Consult for heparin drip Indication: atrial fibrillation  Allergies  Allergen Reactions  . Ace Inhibitors     Other reaction(s): Other (See Comments) Other Reaction: Other reaction  . Alendronate     Other reaction(s): Other (See Comments) Other Reaction: Other reaction  . Eggs Or Egg-Derived Products Other (See Comments)    Unknown, pt just said that she can't eat eggs due to not being able to take flu vaccine  . Influenza Vaccines Other (See Comments)    "kept the flu for a year"    Patient Measurements: Height: 5\' 3"  (160 cm) Weight: 106 lb 11.2 oz (48.4 kg) IBW/kg (Calculated) : 52.4 Heparin Dosing Weight: 48.4kg  Vital Signs: Temp: 98.6 F (37 C) (04/02 1200) Temp Source: Axillary (04/02 1200) BP: 113/66 (04/02 1400) Pulse Rate: 108 (04/02 1400)  Labs: Recent Labs    07/05/17 1917 07/05/17 2049  07/06/17 0150  07/07/17 0509 07/08/17 0530 07/08/17 1427  HGB  --   --    < > 11.1*  --  12.0 9.9*  --   HCT  --   --   --  34.8*  --  37.7 29.5*  --   PLT  --   --   --  202  --  258 185  --   APTT  --  33  --   --   --   --   --   --   LABPROT  --  14.0  --   --   --   --   --   --   INR  --  1.09  --   --   --   --   --   --   HEPARINUNFRC  --   --   --  1.04*   < > 0.64 0.24* 0.26*  CREATININE  --   --   --  0.75  --  0.80 0.84  --   TROPONINI <0.03  --   --   --   --   --   --   --    < > = values in this interval not displayed.    Estimated Creatinine Clearance: 36.1 mL/min (by C-G formula based on SCr of 0.84 mg/dL).   Medical History: Past Medical History:  Diagnosis Date  . Arthritis    hands, knees  . Asthma   . COPD (chronic obstructive pulmonary disease) (HCC)   . Dysrhythmia    A-Fib  . Full dentures    upper and lower  . GERD (gastroesophageal reflux disease)   . Hypercholesteremia   . Hypertension   . Osteoporosis   . Paroxysmal atrial fibrillation (HCC)   . Shortness of breath dyspnea      Medications:  Scheduled:  . budesonide (PULMICORT) nebulizer solution  0.25 mg Nebulization BID  . feeding supplement (ENSURE ENLIVE)  237 mL Oral BID BM  . furosemide  20 mg Intravenous Once  . heparin  700 Units Intravenous Once  . insulin aspart  0-9 Units Subcutaneous TID WC  . levalbuterol  0.63 mg Nebulization Q6H  . mouth rinse  15 mL Mouth Rinse q12n4p  . metoprolol tartrate  5 mg Intravenous Q8H  . pantoprazole (PROTONIX) IV  40 mg Intravenous QHS  . PARoxetine  20 mg Oral Daily   Infusions:  . dextrose 75 mL/hr at 07/08/17 0600  . feeding supplement (GLUCERNA 1.2 CAL)    . heparin 750 Units/hr (07/08/17 0629)  .  piperacillin-tazobactam (ZOSYN)  IV 3.375 g (07/08/17 1407)  . valproate sodium 125 mg (07/08/17 1406)    Assessment: Pharmacy consulted for heparin drip management for 84 yo female on heparin drip for new onset atrial fibrillation. Patient with CHADS2VASc of 5.  -Goal of Therapy:  Heparin level 0.3-0.7 units/ml Monitor platelets by anticoagulation protocol: Yes   Plan:  Will bolus heparin 700 units and increase infusion to 850 units/hr. Although this mimics the previous bolus and increase, will not pursue more aggressive increase at this time due to age and stature. Will recheck a HL in 8 hours.   Luisa Hart, PharmD Clinical Pharmacist 3:20 PM

## 2017-07-08 NOTE — Progress Notes (Signed)
Nutrition Follow-up  DOCUMENTATION CODES:   Non-severe (moderate) malnutrition in context of chronic illness, Underweight  INTERVENTION:  Discontinue Ensure Enlive as patient is NPO.  Once NGT placed and confirmed recommend initiating Glucerna 1.2 Cal at 20 mL/hr and advancing by 15 mL/hr every 8 hours to goal rate of 50 mL/hr (1200 mL goal daily volume). Provides 1440 kcal, 72 grams protein, 972 mL H2O daily.  Recommend free water flush of 30 mL Q4hrs. Provides total of 1152 mL H2O daily including water in tube feeding.  Provide liquid MVI daily per tube.  NUTRITION DIAGNOSIS:   Moderate Malnutrition related to chronic illness(COPD, advanced age) as evidenced by moderate fat depletion, severe muscle depletion.  New nutrition diagnosis.  GOAL:   Patient will meet greater than or equal to 90% of their needs  Not met as patient is NPO. Addressing with initiation of tube feeds.  MONITOR:   PO intake, Diet advancement, Labs, Weight trends, TF tolerance, I & O's  REASON FOR ASSESSMENT:   Rounds    ASSESSMENT:   7 F from nursing home recent hosp for influenza and pneumonia now presenting with hypotension and acute on chronic hypoxic respiratory failure secondary to pneumonia and AECOPD requiring Bipap   -Patient made NPO on 4/1 following SLP evaluation.  Met with patient at bedside. She is back on BiPAP now. She had only been finishing 0-30% of meals before being made NPO. Patient reports she was not eating much PTA and had been losing weight. Per chart she maybe lost about 3 lbs. Appears to actually be fairly weight-stable. Abdomen soft today. Patient denies any N/V or abdominal pain.  Plan is to place small bore feeding tube today for enteral nutrition.  Medications reviewed and include: Lasix 20 mg once IV today, Novolog 0-9 units TID with meals, pantoprazole, D5W at 75 mL/hr (90 grams dextrose, 306 kcal daily), heparin gtt, Zosyn, valproate.  Labs reviewed: CBG 72-112  past 24 hrs, Chloride 100, BUN 22. Potassium, Phosphorus, and Magnesium WNL.  I/O: 1250 mL UOP yesterday (1.1 mL/kg/hr)  No weight since admission to trend.  Discussed with RN and on rounds. Patient is NPO following aspiration. Plan is for tube feeding today. MD wants to use Glucerna 1.2 Cal. If patient becomes distressed comfort measures will be considered.  NUTRITION - FOCUSED PHYSICAL EXAM:    Most Recent Value  Orbital Region  Unable to assess  Upper Arm Region  Moderate depletion  Thoracic and Lumbar Region  Moderate depletion  Buccal Region  Unable to assess  Temple Region  Severe depletion  Clavicle Bone Region  Severe depletion  Clavicle and Acromion Bone Region  Severe depletion  Scapular Bone Region  Severe depletion  Dorsal Hand  Severe depletion  Patellar Region  Severe depletion  Anterior Thigh Region  Severe depletion  Posterior Calf Region  Severe depletion  Edema (RD Assessment)  None  Hair  Reviewed  Eyes  Reviewed  Mouth  Unable to assess  Skin  Reviewed  Nails  Reviewed     Diet Order:  Diet NPO time specified  EDUCATION NEEDS:   Not appropriate for education at this time  Skin:  Skin Assessment: Skin Integrity Issues: Skin Integrity Issues:: Stage I Stage I: bilateral heels  Last BM:  07/06/2017 - smear type 5  Height:   Ht Readings from Last 1 Encounters:  06/09/2017 '5\' 3"'  (1.6 m)    Weight:   Wt Readings from Last 1 Encounters:  06/13/2017 106 lb 11.2 oz (  48.4 kg)    Ideal Body Weight:  52.27 kg  BMI:  Body mass index is 18.9 kg/m.  Estimated Nutritional Needs:   Kcal:  1210-1450 (25-30 kcal/kg)  Protein:  70-80 grams (1.5-1.7 grams/kg)  Fluid:  1.2 L/day (25 mL/kg)  Willey Blade, MS, RD, LDN Office: [phone] Pager: [phone] After Hours/Weekend Pager: [phone]

## 2017-07-09 ENCOUNTER — Inpatient Hospital Stay: Payer: Medicare Other

## 2017-07-09 DIAGNOSIS — E440 Moderate protein-calorie malnutrition: Secondary | ICD-10-CM

## 2017-07-09 DIAGNOSIS — I5031 Acute diastolic (congestive) heart failure: Secondary | ICD-10-CM

## 2017-07-09 DIAGNOSIS — I4891 Unspecified atrial fibrillation: Secondary | ICD-10-CM

## 2017-07-09 DIAGNOSIS — Z515 Encounter for palliative care: Secondary | ICD-10-CM

## 2017-07-09 DIAGNOSIS — Z7189 Other specified counseling: Secondary | ICD-10-CM

## 2017-07-09 DIAGNOSIS — J9622 Acute and chronic respiratory failure with hypercapnia: Secondary | ICD-10-CM

## 2017-07-09 LAB — GLUCOSE, CAPILLARY
GLUCOSE-CAPILLARY: 138 mg/dL — AB (ref 65–99)
Glucose-Capillary: 124 mg/dL — ABNORMAL HIGH (ref 65–99)
Glucose-Capillary: 131 mg/dL — ABNORMAL HIGH (ref 65–99)
Glucose-Capillary: 135 mg/dL — ABNORMAL HIGH (ref 65–99)
Glucose-Capillary: 137 mg/dL — ABNORMAL HIGH (ref 65–99)
Glucose-Capillary: 146 mg/dL — ABNORMAL HIGH (ref 65–99)
Glucose-Capillary: 149 mg/dL — ABNORMAL HIGH (ref 65–99)
Glucose-Capillary: 149 mg/dL — ABNORMAL HIGH (ref 65–99)
Glucose-Capillary: 158 mg/dL — ABNORMAL HIGH (ref 65–99)

## 2017-07-09 LAB — PHOSPHORUS: Phosphorus: 2.7 mg/dL (ref 2.5–4.6)

## 2017-07-09 LAB — BASIC METABOLIC PANEL
Anion gap: 10 (ref 5–15)
BUN: 13 mg/dL (ref 6–20)
CO2: 31 mmol/L (ref 22–32)
CREATININE: 0.81 mg/dL (ref 0.44–1.00)
Calcium: 7.9 mg/dL — ABNORMAL LOW (ref 8.9–10.3)
Chloride: 94 mmol/L — ABNORMAL LOW (ref 101–111)
GFR calc Af Amer: >60 mL/min (ref >60)
GLUCOSE: 159 mg/dL — AB (ref 65–99)
Potassium: 3.2 mmol/L — ABNORMAL LOW (ref 3.5–5.1)
SODIUM: 135 mmol/L (ref 135–145)

## 2017-07-09 LAB — CBC WITH DIFFERENTIAL/PLATELET
BASOS PCT: 0 %
Basophils Absolute: 0.1 10*3/uL (ref 0–0.1)
EOS ABS: 0 10*3/uL (ref 0–0.7)
Eosinophils Relative: 0 %
HEMATOCRIT: 29.7 % — AB (ref 35.0–47.0)
Hemoglobin: 9.6 g/dL — ABNORMAL LOW (ref 12.0–16.0)
LYMPHS ABS: 1.7 10*3/uL (ref 1.0–3.6)
Lymphocytes Relative: 8 %
MCH: 27.2 pg (ref 26.0–34.0)
MCHC: 32.2 g/dL (ref 32.0–36.0)
MCV: 84.4 fL (ref 80.0–100.0)
MONO ABS: 0.8 10*3/uL (ref 0.2–0.9)
MONOS PCT: 4 %
Neutro Abs: 18.5 10*3/uL — ABNORMAL HIGH (ref 1.4–6.5)
Neutrophils Relative %: 88 %
Platelets: 195 10*3/uL (ref 150–440)
RBC: 3.52 MIL/uL — ABNORMAL LOW (ref 3.80–5.20)
RDW: 16 % — AB (ref 11.5–14.5)
WBC: 21.1 10*3/uL — ABNORMAL HIGH (ref 3.6–11.0)

## 2017-07-09 LAB — MAGNESIUM: Magnesium: 1.7 mg/dL (ref 1.7–2.4)

## 2017-07-09 LAB — HEPARIN LEVEL (UNFRACTIONATED)
HEPARIN UNFRACTIONATED: 0.25 [IU]/mL — AB (ref 0.30–0.70)
HEPARIN UNFRACTIONATED: 0.41 [IU]/mL (ref 0.30–0.70)
Heparin Unfractionated: 0.23 IU/mL — ABNORMAL LOW (ref 0.30–0.70)

## 2017-07-09 LAB — CALCIUM, IONIZED: CALCIUM, IONIZED, SERUM: 4.3 mg/dL — AB (ref 4.5–5.6)

## 2017-07-09 MED ORDER — HEPARIN BOLUS VIA INFUSION
700.0000 [IU] | Freq: Once | INTRAVENOUS | Status: AC
  Administered 2017-07-09: 700 [IU] via INTRAVENOUS
  Filled 2017-07-09: qty 700

## 2017-07-09 MED ORDER — LEVALBUTEROL HCL 0.63 MG/3ML IN NEBU
0.6300 mg | INHALATION_SOLUTION | RESPIRATORY_TRACT | Status: DC | PRN
  Administered 2017-07-10 – 2017-07-11 (×2): 0.63 mg via RESPIRATORY_TRACT
  Filled 2017-07-09 (×2): qty 3

## 2017-07-09 MED ORDER — METOPROLOL TARTRATE 5 MG/5ML IV SOLN
5.0000 mg | INTRAVENOUS | Status: AC
  Administered 2017-07-09: 5 mg via INTRAVENOUS
  Filled 2017-07-09: qty 5

## 2017-07-09 MED ORDER — FUROSEMIDE 10 MG/ML IJ SOLN
20.0000 mg | Freq: Every day | INTRAMUSCULAR | Status: DC
  Administered 2017-07-09 – 2017-07-10 (×3): 20 mg via INTRAVENOUS
  Filled 2017-07-09 (×3): qty 2

## 2017-07-09 MED ORDER — METOPROLOL TARTRATE 25 MG PO TABS
25.0000 mg | ORAL_TABLET | Freq: Two times a day (BID) | ORAL | Status: DC
  Administered 2017-07-09 – 2017-07-12 (×7): 25 mg via NASOGASTRIC
  Filled 2017-07-09 (×7): qty 1

## 2017-07-09 MED ORDER — MAGNESIUM SULFATE 2 GM/50ML IV SOLN
2.0000 g | Freq: Once | INTRAVENOUS | Status: AC
  Administered 2017-07-09: 2 g via INTRAVENOUS
  Filled 2017-07-09: qty 50

## 2017-07-09 MED ORDER — POTASSIUM CHLORIDE 10 MEQ/100ML IV SOLN
10.0000 meq | INTRAVENOUS | Status: AC
  Administered 2017-07-09 (×4): 10 meq via INTRAVENOUS
  Filled 2017-07-09 (×4): qty 100

## 2017-07-09 NOTE — Progress Notes (Signed)
ANTICOAGULATION CONSULT NOTE   Pharmacy Consult for heparin drip Indication: atrial fibrillation  Allergies  Allergen Reactions  . Ace Inhibitors     Other reaction(s): Other (See Comments) Other Reaction: Other reaction  . Alendronate     Other reaction(s): Other (See Comments) Other Reaction: Other reaction  . Eggs Or Egg-Derived Products Other (See Comments)    Unknown, pt just said that she can't eat eggs due to not being able to take flu vaccine  . Influenza Vaccines Other (See Comments)    "kept the flu for a year"    Patient Measurements: Height: 5\' 3"  (160 cm) Weight: 106 lb 11.2 oz (48.4 kg) IBW/kg (Calculated) : 52.4 Heparin Dosing Weight: 48.4kg  Vital Signs: Temp: 98.2 F (36.8 C) (04/03 1600) Temp Source: Axillary (04/03 1600) BP: 92/55 (04/03 1800) Pulse Rate: 125 (04/03 1935)  Labs: Recent Labs    07/07/17 0509 07/08/17 0530  07/09/17 0047 07/09/17 0941 07/09/17 2032  HGB 12.0 9.9*  --  9.6*  --   --   HCT 37.7 29.5*  --  29.7*  --   --   PLT 258 185  --  195  --   --   HEPARINUNFRC 0.64 0.24*   < > 0.41 0.23* 0.25*  CREATININE 0.80 0.84  --  0.81  --   --    < > = values in this interval not displayed.    Estimated Creatinine Clearance: 37.4 mL/min (by C-G formula based on SCr of 0.81 mg/dL).   Medical History: Past Medical History:  Diagnosis Date  . Arthritis    hands, knees  . Asthma   . COPD (chronic obstructive pulmonary disease) (HCC)   . Dysrhythmia    A-Fib  . Full dentures    upper and lower  . GERD (gastroesophageal reflux disease)   . Hypercholesteremia   . Hypertension   . Osteoporosis   . Paroxysmal atrial fibrillation (HCC)   . Shortness of breath dyspnea     Medications:  Scheduled:  . budesonide (PULMICORT) nebulizer solution  0.25 mg Nebulization BID  . furosemide  20 mg Intravenous Daily  . heparin  700 Units Intravenous Once  . insulin aspart  0-9 Units Subcutaneous TID WC  . levalbuterol  0.63 mg  Nebulization Q6H  . mouth rinse  15 mL Mouth Rinse q12n4p  . metoprolol tartrate  25 mg Per NG tube BID  . pantoprazole (PROTONIX) IV  40 mg Intravenous QHS  . PARoxetine  20 mg Oral Daily   Infusions:  . dextrose 25 mL/hr at 07/09/17 1655  . feeding supplement (GLUCERNA 1.2 CAL) 20 mL/hr at 07/08/17 2100  . heparin 950 Units/hr (07/09/17 1235)  . piperacillin-tazobactam (ZOSYN)  IV Stopped (07/09/17 1847)  . valproate sodium Stopped (07/09/17 1559)    Assessment: Pharmacy consulted for heparin drip management for 84 yo female on heparin drip for new onset atrial fibrillation. Patient with CHADS2VASc of 5.  -Goal of Therapy:  Heparin level 0.3-0.7 units/ml Monitor platelets by anticoagulation protocol: Yes   Plan:  Will bolus heparin 700 units and increase infusion to 850 units/hr. Although this mimics the previous bolus and increase, will not pursue more aggressive increase at this time due to age and stature. Will recheck a HL in 8 hours.   04/03 0100 heparin level 0.41. Continue current regimen. Recheck in 8 hours to confirm.  04/03 2030 HL = 0.25.   Will order Heparin 700 units IV X 1 bolus and increase drip rate  to 1050 units/hr.  Will recheck HL 8 hrs after rate change.   Zoha Spranger D 07/09/17 10:04 PM

## 2017-07-09 NOTE — Progress Notes (Signed)
ANTICOAGULATION CONSULT NOTE   Pharmacy Consult for heparin drip Indication: atrial fibrillation  Allergies  Allergen Reactions  . Ace Inhibitors     Other reaction(s): Other (See Comments) Other Reaction: Other reaction  . Alendronate     Other reaction(s): Other (See Comments) Other Reaction: Other reaction  . Eggs Or Egg-Derived Products Other (See Comments)    Unknown, pt just said that she can't eat eggs due to not being able to take flu vaccine  . Influenza Vaccines Other (See Comments)    "kept the flu for a year"    Patient Measurements: Height: 5\' 3"  (160 cm) Weight: 106 lb 11.2 oz (48.4 kg) IBW/kg (Calculated) : 52.4 Heparin Dosing Weight: 48.4kg  Vital Signs: Temp: 97.8 F (36.6 C) (04/03 0000) Temp Source: Oral (04/03 0000) BP: 114/46 (04/03 0100) Pulse Rate: 105 (04/03 0100)  Labs: Recent Labs    07/07/17 0509 07/08/17 0530 07/08/17 1427 07/09/17 0047  HGB 12.0 9.9*  --  9.6*  HCT 37.7 29.5*  --  29.7*  PLT 258 185  --  195  HEPARINUNFRC 0.64 0.24* 0.26* 0.41  CREATININE 0.80 0.84  --  0.81    Estimated Creatinine Clearance: 37.4 mL/min (by C-G formula based on SCr of 0.81 mg/dL).   Medical History: Past Medical History:  Diagnosis Date  . Arthritis    hands, knees  . Asthma   . COPD (chronic obstructive pulmonary disease) (HCC)   . Dysrhythmia    A-Fib  . Full dentures    upper and lower  . GERD (gastroesophageal reflux disease)   . Hypercholesteremia   . Hypertension   . Osteoporosis   . Paroxysmal atrial fibrillation (HCC)   . Shortness of breath dyspnea     Medications:  Scheduled:  . budesonide (PULMICORT) nebulizer solution  0.25 mg Nebulization BID  . insulin aspart  0-9 Units Subcutaneous TID WC  . levalbuterol  0.63 mg Nebulization Q6H  . mouth rinse  15 mL Mouth Rinse q12n4p  . metoprolol tartrate  5 mg Intravenous Q8H  . metoprolol tartrate  5 mg Intravenous STAT  . pantoprazole (PROTONIX) IV  40 mg Intravenous QHS   . PARoxetine  20 mg Oral Daily   Infusions:  . dextrose 75 mL/hr at 07/08/17 2100  . feeding supplement (GLUCERNA 1.2 CAL) 20 mL/hr at 07/08/17 2100  . heparin 850 Units/hr (07/08/17 2142)  . piperacillin-tazobactam (ZOSYN)  IV 3.375 g (07/08/17 2136)  . valproate sodium Stopped (07/08/17 2236)    Assessment: Pharmacy consulted for heparin drip management for 84 yo female on heparin drip for new onset atrial fibrillation. Patient with CHADS2VASc of 5.  -Goal of Therapy:  Heparin level 0.3-0.7 units/ml Monitor platelets by anticoagulation protocol: Yes   Plan:  Will bolus heparin 700 units and increase infusion to 850 units/hr. Although this mimics the previous bolus and increase, will not pursue more aggressive increase at this time due to age and stature. Will recheck a HL in 8 hours.   04/03 0100 heparin level 0.41. Continue current regimen. Recheck in 8 hours to confirm.  06/03, PharmD, BCPS  07/09/17 1:38 AM

## 2017-07-09 NOTE — Progress Notes (Signed)
Progress Note  Patient Name: Crystal Todd Date of Encounter: 07/09/2017  Primary Cardiologist: Lorine Bears, MD  Subjective   Desaturated last night and currently requiring BiPAP.  Resting in bed. No acute distress.  Denies chest pain.  Inpatient Medications    Scheduled Meds: . budesonide (PULMICORT) nebulizer solution  0.25 mg Nebulization BID  . furosemide  20 mg Intravenous Daily  . insulin aspart  0-9 Units Subcutaneous TID WC  . levalbuterol  0.63 mg Nebulization Q6H  . mouth rinse  15 mL Mouth Rinse q12n4p  . metoprolol tartrate  5 mg Intravenous STAT  . metoprolol tartrate  25 mg Per NG tube BID  . pantoprazole (PROTONIX) IV  40 mg Intravenous QHS  . PARoxetine  20 mg Oral Daily   Continuous Infusions: . dextrose Stopped (07/09/17 0752)  . feeding supplement (GLUCERNA 1.2 CAL) 20 mL/hr at 07/08/17 2100  . heparin 850 Units/hr (07/08/17 2142)  . piperacillin-tazobactam (ZOSYN)  IV 3.375 g (07/09/17 0556)  . potassium chloride 10 mEq (07/09/17 0757)  . valproate sodium 125 mg (07/09/17 0556)   PRN Meds: acetaminophen **OR** acetaminophen, alum & mag hydroxide-simeth, bisacodyl, guaiFENesin, levalbuterol, magnesium hydroxide, morphine injection, ondansetron **OR** ondansetron (ZOFRAN) IV, senna-docusate   Vital Signs    Vitals:   07/09/17 0500 07/09/17 0600 07/09/17 0700 07/09/17 0800  BP: 91/60 116/61 (!) 94/53 (!) 114/52  Pulse: 95 (!) 113 98 99  Resp: 18 (!) 38 19 20  Temp:    98.9 F (37.2 C)  TempSrc:    Axillary  SpO2: 98% 96% 98% 94%  Weight:      Height:        Intake/Output Summary (Last 24 hours) at 07/09/2017 0943 Last data filed at 07/09/2017 0800 Gross per 24 hour  Intake 2582.78 ml  Output -  Net 2582.78 ml   Filed Weights   06/20/2017 2310  Weight: 106 lb 11.2 oz (48.4 kg)    Physical Exam   GEN: Frail, thin, in no acute distress.  HEENT: Grossly normal.  Neck: Supple, mildly elev JVP ~ 10-12cm. Cardiac: IR, IR, no murmurs,  rubs, or gallops. No clubbing, cyanosis, edema.  Radials/DP/PT 1+ and equal bilaterally.  Respiratory:  Respirations regular and unlabored, scattered rhonchi anteriorly. GI: Soft, nontender, nondistended, BS + x 4. MS: no deformity or atrophy. Skin: warm and dry, no rash. Neuro:  Strength and sensation are intact. Psych: Normal affect.  Labs    Chemistry Recent Labs  Lab 07/02/17 1118 06/07/2017 2017  07/06/17 0150 07/07/17 0509 07/08/17 0530 07/09/17 0047  NA 134* 136   < > 137 139 138 135  K 4.5 4.5   < > 4.6 4.9 3.6 3.2*  CL 97* 101   < > 106 103 100* 94*  CO2 24 22   < > 21* 25 31 31   GLUCOSE 117* 161*   < > 207* 146* 119* 159*  BUN 18 23*   < > 23* 27* 22* 13  CREATININE 0.75 0.93   < > 0.75 0.80 0.84 0.81  CALCIUM 8.5* 7.8*   < > 7.8* 7.9* 7.8* 7.9*  PROT 7.5 6.3*  --  5.9*  --   --   --   ALBUMIN 3.6 2.8*  --  2.6*  --   --   --   AST 20 21  --  39  --   --   --   ALT 21 19  --  52  --   --   --  ALKPHOS 90 68  --  66  --   --   --   BILITOT 1.0 1.0  --  0.4  --   --   --   GFRNONAA >60 54*   < > >60 >60 >60 >60  GFRAA >60 >60   < > >60 >60 >60 >60  ANIONGAP 13 13   < > 10 11 7 10    < > = values in this interval not displayed.     Hematology Recent Labs  Lab 07/07/17 0509 07/08/17 0530 07/09/17 0047  WBC 25.6* 14.6* 21.1*  RBC 4.44 3.57* 3.52*  HGB 12.0 9.9* 9.6*  HCT 37.7 29.5* 29.7*  MCV 84.8 82.5 84.4  MCH 27.1 27.7 27.2  MCHC 31.9* 33.6 32.2  RDW 16.9* 16.5* 16.0*  PLT 258 185 195    Cardiac Enzymes Recent Labs  Lab July 07, 2017 2017 07/05/17 0751 07/05/17 1404 07/05/17 1917  TROPONINI 0.06* <0.03 <0.03 <0.03      BNP Recent Labs  Lab 07/02/17 1118  BNP 252.0*      Radiology    Dg Chest Port 1 View  Result Date: 07/09/2017 CLINICAL DATA:  Shortness of Breath EXAM: PORTABLE CHEST 1 VIEW COMPARISON:  July 08, 2017 FINDINGS: There is airspace consolidation throughout much of the right upper lobe, concerning for pneumonia. Lungs elsewhere  appear clear. There is cardiomegaly with pulmonary venous hypertension. No adenopathy. There is aortic atherosclerosis. No adenopathy evident. Feeding tube tip is below the diaphragm. Bones are diffusely osteoporotic. There is evidence of an old healed fracture of the left clavicle. IMPRESSION: Airspace opacity right upper lobe concerning for pneumonia. Lungs elsewhere clear. There is underlying pulmonary vascular congestion. There is aortic atherosclerosis. Aortic Atherosclerosis (ICD10-I70.0). Electronically Signed   By: July 10, 2017 III M.D.   On: 07/09/2017 07:53   Dg Chest Port 1 View  Result Date: 07/08/2017 CLINICAL DATA:  CHF EXAM: PORTABLE CHEST 1 VIEW COMPARISON:  07/07/2017, 07/06/2017, 07/05/2017, 07-07-17 FINDINGS: Small left pleural effusion. Stable enlarged cardiomediastinal silhouette. Vascular congestion. Worsened upper lobe airspace disease. No pneumothorax. Prior left proximal humerus fracture old bilateral rib fractures. IMPRESSION: Cardiomegaly with vascular congestion. Worsening airspace disease in the upper lobes, edema versus pneumonia. Small left effusion. Persistent consolidation at the left lung base. Electronically Signed   By: 07/05/2017 M.D.   On: 07/08/2017 02:53   Dg Abd Portable 1v  Result Date: 07/08/2017 CLINICAL DATA:  Check Dobbhoff placement EXAM: PORTABLE ABDOMEN - 1 VIEW COMPARISON:  None. FINDINGS: Scattered large and small bowel gas is noted. Changes of cholelithiasis are seen. A feeding catheter is noted within the stomach in the midportion. Changes of prior vertebral augmentation and right hip fixation are noted. IMPRESSION: Dobbhoff catheter in the mid stomach Electronically Signed   By: 09/07/2017 M.D.   On: 07/08/2017 15:50    Telemetry    Afib, 90's to low 100's - Personally Reviewed  Cardiac Studies    TTE (07/06/17): - Left ventricle: The cavity size was normal. Wall thickness was normal. Systolic function was vigorous. The estimated  ejection fraction was in the range of 65% to 70%. Left ventricular diastolic function parameters were normal. - Mitral valve: There was moderate regurgitation. - Left atrium: The atrium was moderately dilated. - Right atrium: The atrium was mildly dilated. - Tricuspid valve: There was moderate regurgitation. - Pulmonary arteries: PA peak pressure: 50 mm Hg (S).  Patient Profile     84 y.o. female with history of HTN, HLD, OA,  and asthma who was admitted to [facility] with influenza PNA and noted to be in Afib with RVR.  Assessment & Plan    1.  Afib RVR:  In setting of influenze and RUL pna.  Rates 90's to low 100's.  Echo w/ nl EF.  CHA2DS2VASc = 5.  Currently on heparin and IV metoprolol q 8h.  She is not currently taking PO's but does have a dobhoff.  Cont heparin w/ plan to transition to DOAC prior to d/c (Eliquis 2.5 BID).  Will change lopressor to 25 BID via tube for longer acting effect.  2.  Acute resp failure/Influenza: multifactorial in the setting of RUL PNA, influenza, diast failure, and rapid afib.  Desaturated last evening and has required BiPAP.  Abx per IM.  Net + of 5.3L since admission.  Vascular congestion on CXR this AM.  Will add daily lasix @ 20 mg IV.  3.  Acute on chronic diastolic CHF/PAH:  EF 65-70% by echo this admission.  Vasc congestion on CXR in setting of worsening hypoxia.  Adding lasix as above.  Rates could be better  changing  blocker to via tube.  4.  Anemia: stable this AM.  5.  Hypokalemia:  Receiving IV supplementation this AM.  Signed, Nicolasa Ducking, NP  07/09/2017, 9:43 AM    For questions or updates, please contact   Please consult [email or web address] for contact info under Cardiology/STEMI.

## 2017-07-09 NOTE — Progress Notes (Signed)
This nurse was able to get ahold of her niece, Steward Drone ([place]). Steward Drone stated that if the patient were to become disoriented and unable to make her own medical decisions, she would feel ready to transition the patient to comfort care. But would like this to be discussed with her before this is officially done. Additionally, I spoke with the patient who told me that she would like to continue to be on the bipap if necessary but verbalized to me that she will think about the possibility of transitioning to comfort care.

## 2017-07-09 NOTE — Progress Notes (Signed)
[employer] [facility] [place] [facility]   PATIENT NAME: Crystal Todd    MR#:  [id number]  DATE OF BIRTH:  May 13, 1927  SUBJECTIVE:   Patient now on Bipap Lethargic this am   REVIEW OF SYSTEMS:    Unable to obtain patient on Bipap lethargic this am   Tolerating Diet: N.p.o.    DRUG ALLERGIES:   Allergies  Allergen Reactions  . Ace Inhibitors     Other reaction(s): Other (See Comments) Other Reaction: Other reaction  . Alendronate     Other reaction(s): Other (See Comments) Other Reaction: Other reaction  . Eggs Or Egg-Derived Products Other (See Comments)    Unknown, pt just said that she can't eat eggs due to not being able to take flu vaccine  . Influenza Vaccines Other (See Comments)    "kept the flu for a year"    VITALS:  Blood pressure (!) 114/52, pulse 99, temperature 98.9 F (37.2 C), temperature source Axillary, resp. rate 20, height 5\' 3"  (1.6 m), weight 48.4 kg (106 lb 11.2 oz), SpO2 94 %.  PHYSICAL EXAMINATION:  Constitutional: Appears thin and frail  hENT: Normocephalic. . On BIpap Eyes: Conjunctivae and EOM are normal. PERRLA, no scleral icterus.  Neck:  Neck supple. No JVD. No tracheal deviation. CVS: irr, irr tachy , no murmurs, no gallops, no carotid bruit.  Pulmonary: b/l coarse rhonchi  Abdominal: Soft. BS +,  no distension, tenderness, rebound or guarding.  Musculoskeletal: . No edema and no tenderness.  Neuro: on bipap Skin: Skin is warm and dry. No rash noted. Psychiatric:sleepy/lethargic    LABORATORY PANEL:   CBC Recent Labs  Lab 07/09/17 0047  WBC 21.1*  HGB 9.6*  HCT 29.7*  PLT 195   ------------------------------------------------------------------------------------------------------------------  Chemistries  Recent Labs  Lab 07/06/17 0150  07/09/17 0047  NA 137   < > 135  K 4.6   < > 3.2*  CL 106   < > 94*  CO2 21*   < > 31  GLUCOSE 207*   < > 159*  BUN 23*   < > 13  CREATININE 0.75   < > 0.81   CALCIUM 7.8*   < > 7.9*  MG 2.4   < > 1.7  AST 39  --   --   ALT 52  --   --   ALKPHOS 66  --   --   BILITOT 0.4  --   --    < > = values in this interval not displayed.   ------------------------------------------------------------------------------------------------------------------  Cardiac Enzymes Recent Labs  Lab 07/05/17 0751 07/05/17 1404 07/05/17 1917  TROPONINI <0.03 <0.03 <0.03   ------------------------------------------------------------------------------------------------------------------  RADIOLOGY:  Dg Chest Port 1 View  Result Date: 07/09/2017 CLINICAL DATA:  Shortness of Breath EXAM: PORTABLE CHEST 1 VIEW COMPARISON:  July 08, 2017 FINDINGS: There is airspace consolidation throughout much of the right upper lobe, concerning for pneumonia. Lungs elsewhere appear clear. There is cardiomegaly with pulmonary venous hypertension. No adenopathy. There is aortic atherosclerosis. No adenopathy evident. Feeding tube tip is below the diaphragm. Bones are diffusely osteoporotic. There is evidence of an old healed fracture of the left clavicle. IMPRESSION: Airspace opacity right upper lobe concerning for pneumonia. Lungs elsewhere clear. There is underlying pulmonary vascular congestion. There is aortic atherosclerosis. Aortic Atherosclerosis (ICD10-I70.0). Electronically Signed   By: July 10, 2017 III M.D.   On: 07/09/2017 07:53   Dg Chest Port 1 View  Result Date: 07/08/2017 CLINICAL DATA:  CHF EXAM: PORTABLE  CHEST 1 VIEW COMPARISON:  07/07/2017, 07/06/2017, 07/05/2017, 07/06/2017 FINDINGS: Small left pleural effusion. Stable enlarged cardiomediastinal silhouette. Vascular congestion. Worsened upper lobe airspace disease. No pneumothorax. Prior left proximal humerus fracture old bilateral rib fractures. IMPRESSION: Cardiomegaly with vascular congestion. Worsening airspace disease in the upper lobes, edema versus pneumonia. Small left effusion. Persistent consolidation at the  left lung base. Electronically Signed   By: Jasmine Pang M.D.   On: 07/08/2017 02:53   Dg Abd Portable 1v  Result Date: 07/08/2017 CLINICAL DATA:  Check Dobbhoff placement EXAM: PORTABLE ABDOMEN - 1 VIEW COMPARISON:  None. FINDINGS: Scattered large and small bowel gas is noted. Changes of cholelithiasis are seen. A feeding catheter is noted within the stomach in the midportion. Changes of prior vertebral augmentation and right hip fixation are noted. IMPRESSION: Dobbhoff catheter in the mid stomach Electronically Signed   By: Alcide Clever M.D.   On: 07/08/2017 15:50     ASSESSMENT AND PLAN:   84 year old female with history of COPD, essential hypertension and PAF with recent discharge from the hospital for influenza A and COPD who presented with shortness of breath.  1.  Septic shock with acute hypoxic respiratory failure Patient presented with hypotension, tachycardia, leukocytosis and fever  Sepsis is due to pneumonia and influenza A Patient weaned from BiPAP back on nasal cannula, however due to increased respiratory effort is now on high flow nasal cannula There is concern for aspiration causing acute hypoxic respiratory failure. Continue Zosyn for aspiration pneumonia Completed treatment with Tamiflu for influenza. Wean Bipap as tolerated Continue nebulizer treatment NPO for high risk of aspiration as per Speech consult   2.  Pneumonia: Initially patient was treated for healthcare acquired pneumonia however it appears now that patient is aspirating.  Antibiotics changed from cefepime to Zosyn (day 4) MRSA PCR negative   3.  Influenza A: She has completed treatment with Tamiflu. 4.  Septic shock: Blood pressure tolerable  5.  PAF with RVR Continue metoprolol and heparin gtt as per Cardiology Echo shows normal ejection fraction with moderate mitral regurgitation and elevated PA pressures Appreciate cardiology consultation TSH was wnml Monitor Hgb   6.  Acute COPD exacerbation  due to pneumonia and influenza: Patient is off of steroids.  Continue levalbuterol   7.  Elevated troponin due to demand ischemia: Patient has been ruled out for ACS.  8. Elevated blood sugar due to steroids A1c is 5.8   9.  Acute diastolic heart failure with preserved ejection fraction: Patient appears to be Euvolemic  10. Aspiration risk: Patient is NPO and high risk of aspiration. She is DNR. Palliative care consult appreciated.  11. Hypokalemia: This will be repleted.  12.  Elevated WBC: Continue to monitor     CODE STATUS: DNR  TOTAL TIME TAKING CARE OF THIS PATIENT: 24 minutes.       POSSIBLE D/C?? DEPENDING ON CLINICAL CONDITION.   Naftoli Penny M.D on 07/09/2017 at 8:31 AM  Between 7am to 6pm - Pager - [phone] After 6pm go to [email or web address] - password [email or web address]  [facility]  Office  [phone]  CC: Primary care physician; Patient, No Pcp Per  Note: This dictation was prepared with Dragon dictation along with smaller phrase technology. Any transcriptional errors that result from this process are unintentional.

## 2017-07-09 NOTE — Progress Notes (Signed)
SLP Cancellation Note  Patient Details Name: Crystal Todd MRN: [id number] DOB: 1928/02/14   Cancelled treatment:       Reason Eval/Treat Not Completed: Medical issues which prohibited therapy;Patient not medically ready(chart reviewed; consulted NSG re: pt's status). Pt currently on BIPAP for O2 support. ST services will continue to follow next 1-2 days. NSG agreed.     Jerilynn Som, MS, CCC-SLP Watson,Katherine 07/09/2017, 4:50 PM

## 2017-07-09 NOTE — Consult Note (Addendum)
Consultation Note Date: 07/09/2017   Patient Name: Crystal Todd  DOB: Mar 24, 1928  MRN: [id number]  Age / Sex: 84 y.o., female  PCP: Patient, No Pcp Per Referring Physician: Adrian Saran, MD  Reason for Consultation: Establishing goals of care  HPI/Patient Profile: Crystal Todd  is a 84 y.o. female with a known history of recent influenza A, hypertension, paroxysmal A. fib, COPD, hyperlipidemia and arthritis.    Clinical Assessment and Goals of Care: Patient is alert and oriented on BIPAP at this time. She has no children and has been married twice, both husbands are deceased. Her niece Steward Drone is POA for her. She states she lives at the [place] and enjoys [place]. She goes on trips with a married friend of hers and loves the mountains. She uses a walker. She states her appetite has not been good recently.   We discussed goals of care and her respiratory status. She states she is not ready to give up. She states she "wants to keep ticking". Discussed code status and she would like resuscitative efforts. She would like ventilator support if needed as she wants to try to get better.       SUMMARY OF RECOMMENDATIONS    Patient would like full scope of treatment at this time. Will continue to follow.    Code Status/Advance Care Planning:  Recommend full code    Symptom Management:   Per primary team.   Palliative Prophylaxis:   Eye Care and Oral Care   Prognosis:   Poor. BIPAP an high flow cannula.   Discharge Planning: To Be Determined      Primary Diagnoses: Present on Admission: . Acute respiratory failure with hypoxia (HCC)   I have reviewed the medical record, interviewed the patient and family, and examined the patient. The following aspects are pertinent.  Past Medical History:  Diagnosis Date  . Arthritis    hands, knees  . Asthma   . COPD (chronic obstructive pulmonary  disease) (HCC)   . Dysrhythmia    A-Fib  . Full dentures    upper and lower  . GERD (gastroesophageal reflux disease)   . Hypercholesteremia   . Hypertension   . Osteoporosis   . Paroxysmal atrial fibrillation (HCC)   . Shortness of breath dyspnea    Social History   Socioeconomic History  . Marital status: Divorced    Spouse name: Not on file  . Number of children: Not on file  . Years of education: Not on file  . Highest education level: Not on file  Occupational History  . Not on file  Social Needs  . Financial resource strain: Not on file  . Food insecurity:    Worry: Not on file    Inability: Not on file  . Transportation needs:    Medical: Not on file    Non-medical: Not on file  Tobacco Use  . Smoking status: Former Smoker    Last attempt to quit: 04/08/1974    Years since quitting: 43.2  . Smokeless tobacco:  Never Used  Substance and Sexual Activity  . Alcohol use: No  . Drug use: Not on file  . Sexual activity: Not on file  Lifestyle  . Physical activity:    Days per week: Not on file    Minutes per session: Not on file  . Stress: Not on file  Relationships  . Social connections:    Talks on phone: Not on file    Gets together: Not on file    Attends religious service: Not on file    Active member of club or organization: Not on file    Attends meetings of clubs or organizations: Not on file    Relationship status: Not on file  Other Topics Concern  . Not on file  Social History Narrative  . Not on file   History reviewed. No pertinent family history. Scheduled Meds: . budesonide (PULMICORT) nebulizer solution  0.25 mg Nebulization BID  . furosemide  20 mg Intravenous Daily  . insulin aspart  0-9 Units Subcutaneous TID WC  . levalbuterol  0.63 mg Nebulization Q6H  . mouth rinse  15 mL Mouth Rinse q12n4p  . metoprolol tartrate  5 mg Intravenous STAT  . metoprolol tartrate  25 mg Per NG tube BID  . pantoprazole (PROTONIX) IV  40 mg Intravenous  QHS  . PARoxetine  20 mg Oral Daily   Continuous Infusions: . dextrose Stopped (07/09/17 0752)  . feeding supplement (GLUCERNA 1.2 CAL) 20 mL/hr at 07/08/17 2100  . heparin 950 Units/hr (07/09/17 1235)  . piperacillin-tazobactam (ZOSYN)  IV 3.375 g (07/09/17 1430)  . valproate sodium 125 mg (07/09/17 1430)   PRN Meds:.acetaminophen **OR** acetaminophen, alum & mag hydroxide-simeth, bisacodyl, guaiFENesin, levalbuterol, magnesium hydroxide, morphine injection, ondansetron **OR** ondansetron (ZOFRAN) IV, senna-docusate Medications Prior to Admission:  Prior to Admission medications   Medication Sig Start Date End Date Taking? Authorizing Provider  acetaminophen (TYLENOL) 325 MG tablet Take 650 mg by mouth every 4 (four) hours as needed for fever (up to 101).   Yes [provider]  albuterol (PROVENTIL HFA;VENTOLIN HFA) 108 (90 Base) MCG/ACT inhaler Inhale 2 puffs into the lungs every 6 (six) hours as needed for wheezing or shortness of breath. 07/02/17  Yes Willy Eddy, MD  alum & mag hydroxide-simeth (ANTACID) 200-200-20 MG/5ML suspension Take 30 mLs by mouth every 6 (six) hours as needed for indigestion or heartburn.   Yes [provider]  amLODipine (NORVASC) 5 MG tablet Take 5 mg by mouth daily.   Yes [provider]  cholecalciferol (VITAMIN D) 1000 units tablet Take 1,000 Units by mouth daily.   Yes [provider]  diphenhydrAMINE (BENADRYL) 25 MG tablet Take 25 mg by mouth every 6 (six) hours as needed for allergies.   Yes [provider]  divalproex (DEPAKOTE SPRINKLE) 125 MG capsule Take 125-250 mg by mouth 2 (two) times daily. 125 mg every morning and 250 mg at bedtime   Yes [provider]  fluticasone (FLONASE) 50 MCG/ACT nasal spray Place 2 sprays into the nose 2 (two) times daily.   Yes [provider]  guaiFENesin (ROBITUSSIN) 100 MG/5ML liquid Take 300 mg by mouth every 6 (six) hours as needed for cough.   Yes  [provider]  ipratropium-albuterol (DUONEB) 0.5-2.5 (3) MG/3ML SOLN Take 3 mLs by nebulization every 6 (six) hours as needed (SOB or wheezing). Patient taking differently: Take 3 mLs by nebulization every 6 (six) hours as needed. For wheezing or shortness of breath 06/30/17  Yes  Milagros Loll, MD  loperamide (IMODIUM A-D) 2 MG capsule Take 4 mg by mouth as needed for diarrhea or loose stools.   Yes [provider]  magnesium hydroxide (MILK OF MAGNESIA) 400 MG/5ML suspension Take 30 mLs by mouth daily as needed for mild constipation.   Yes [provider]  metoprolol tartrate (LOPRESSOR) 25 MG tablet Take 12.5 mg by mouth 2 (two) times daily.   Yes [provider]  multivitamin (PROSIGHT) TABS tablet Take 1 tablet by mouth daily.   Yes [provider]  omeprazole (PRILOSEC) 20 MG capsule Take 20 mg by mouth daily.   Yes [provider]  oseltamivir (TAMIFLU) 30 MG capsule Take 1 capsule (30 mg total) by mouth 2 (two) times daily. 06/30/17  Yes Sudini, Wardell Heath, MD  PARoxetine (PAXIL) 20 MG tablet Take 20 mg by mouth daily.   Yes [provider]  predniSONE (DELTASONE) 10 MG tablet Take 10 mg by mouth 4 (four) times daily. For 2 days, then 3 times daily for 2 days, then 2 times daily for 2 days, then daily for 2 days, then stop 07/01/17  Yes [provider]  vitamin B-12 (CYANOCOBALAMIN) 1000 MCG tablet Take 1,000 mcg by mouth daily.   Yes [provider]  levofloxacin (LEVAQUIN) 250 MG tablet Take 1 tablet (250 mg total) by mouth daily for 7 days. Patient not taking: Reported on July 21, 2017 07/02/17 07/09/17  Willy Eddy, MD   Allergies  Allergen Reactions  . Ace Inhibitors     Other reaction(s): Other (See Comments) Other Reaction: Other reaction  . Alendronate     Other reaction(s): Other (See Comments) Other Reaction: Other reaction  . Eggs Or Egg-Derived Products Other (See Comments)    Unknown, pt just said  that she can't eat eggs due to not being able to take flu vaccine  . Influenza Vaccines Other (See Comments)    "kept the flu for a year"   Review of Systems  All other systems reviewed and are negative. Physical Exam: Normocephalic BIPAP No respiratory distress noted Warm and dry   Vital Signs: BP (!) 83/61   Pulse 91   Temp 98.9 F (37.2 C) (Axillary)   Resp (!) 21   Ht 5\' 3"  (1.6 m)   Wt 48.4 kg (106 lb 11.2 oz)   SpO2 94%   BMI 18.90 kg/m  Pain Scale: 0-10 POSS *See Group Information*: 4-INTERVENTION REQUIRED,Unacceptable,Somnolent, mininal or no response to verbal and physical stimulation Pain Score: 0-No pain   SpO2: SpO2: 94 % O2 Device:SpO2: 94 % O2 Flow Rate: .O2 Flow Rate (L/min): 50 L/min  IO: Intake/output summary:   Intake/Output Summary (Last 24 hours) at 07/09/2017 1506 Last data filed at 07/09/2017 1401 Gross per 24 hour  Intake 2589.48 ml  Output -  Net 2589.48 ml    LBM: Last BM Date: 07/06/17 Baseline Weight: Weight: 48.4 kg (106 lb 11.2 oz) Most recent weight: Weight: 48.4 kg (106 lb 11.2 oz)     Palliative Assessment/Data: 20%     Time In: 2:40 Time Out: 3:10 Time Total:30 min Greater than 50%  of this time was spent counseling and coordinating care related to the above assessment and plan.  Signed by: 07/08/17, NP   Please contact Palliative Medicine Team phone at [phone] for questions and concerns.  For individual provider: See [phone]

## 2017-07-09 NOTE — Progress Notes (Signed)
Name: Crystal Todd MRN: [id number] DOB: 1927/11/28     CONSULTATION DATE: 07/17/2017 Desaturation on HFNC and requiring BiPAP 04/13/78%  PAST MEDICAL HISTORY :   has a past medical history of Arthritis, Asthma, COPD (chronic obstructive pulmonary disease) (HCC), Dysrhythmia, Full dentures, GERD (gastroesophageal reflux disease), Hypercholesteremia, Hypertension, Osteoporosis, Paroxysmal atrial fibrillation (HCC), and Shortness of breath dyspnea.  has a past surgical history that includes Back surgery; Esophagogastroduodenoscopy (egd) with esophageal dilation; Joint replacement; Wrist fracture surgery (Bilateral); and Cataract extraction w/PHACO (Left, 10/12/2014). Prior to Admission medications   Medication Sig Start Date End Date Taking? Authorizing Provider  acetaminophen (TYLENOL) 325 MG tablet Take 650 mg by mouth every 4 (four) hours as needed for fever (up to 101).   Yes [provider]  albuterol (PROVENTIL HFA;VENTOLIN HFA) 108 (90 Base) MCG/ACT inhaler Inhale 2 puffs into the lungs every 6 (six) hours as needed for wheezing or shortness of breath. 07/02/17  Yes Willy Eddy, MD  alum & mag hydroxide-simeth (ANTACID) 200-200-20 MG/5ML suspension Take 30 mLs by mouth every 6 (six) hours as needed for indigestion or heartburn.   Yes [provider]  amLODipine (NORVASC) 5 MG tablet Take 5 mg by mouth daily.   Yes [provider]  cholecalciferol (VITAMIN D) 1000 units tablet Take 1,000 Units by mouth daily.   Yes [provider]  diphenhydrAMINE (BENADRYL) 25 MG tablet Take 25 mg by mouth every 6 (six) hours as needed for allergies.   Yes [provider]  divalproex (DEPAKOTE SPRINKLE) 125 MG capsule Take 125-250 mg by mouth 2 (two) times daily. 125 mg every morning and 250 mg at bedtime   Yes [provider]  fluticasone (FLONASE) 50 MCG/ACT nasal spray Place 2 sprays into the nose 2 (two) times daily.   Yes [provider]  guaiFENesin (ROBITUSSIN) 100 MG/5ML liquid Take 300 mg by mouth every 6 (six) hours as needed for cough.   Yes [provider]  ipratropium-albuterol (DUONEB) 0.5-2.5 (3) MG/3ML SOLN Take 3 mLs by nebulization every 6 (six) hours as needed (SOB or wheezing). Patient taking differently: Take 3 mLs by nebulization every 6 (six) hours as needed. For wheezing or shortness of breath 06/30/17  Yes Sudini, Wardell Heath, MD  loperamide (IMODIUM A-D) 2 MG capsule Take 4 mg by mouth as needed for diarrhea or loose stools.   Yes [provider]  magnesium hydroxide (MILK OF MAGNESIA) 400 MG/5ML suspension Take 30 mLs by mouth daily as needed for mild constipation.   Yes [provider]  metoprolol tartrate (LOPRESSOR) 25 MG tablet Take 12.5 mg by mouth 2 (two) times daily.   Yes [provider]  multivitamin (PROSIGHT) TABS tablet Take 1 tablet by mouth daily.   Yes [provider]  omeprazole (PRILOSEC) 20 MG capsule Take 20 mg by mouth daily.   Yes [provider]  oseltamivir (TAMIFLU) 30 MG capsule Take 1 capsule (30 mg total) by mouth 2 (two) times daily. 06/30/17  Yes Sudini, Wardell Heath, MD  PARoxetine (PAXIL) 20 MG tablet Take 20 mg by mouth daily.   Yes [provider]  predniSONE (DELTASONE) 10 MG tablet Take 10 mg by mouth 4 (four) times daily. For 2 days, then 3 times daily for 2 days, then 2 times daily for 2 days, then daily for 2 days, then stop 07/01/17  Yes [provider]  vitamin B-12 (CYANOCOBALAMIN) 1000 MCG tablet Take 1,000 mcg by mouth daily.   Yes [provider]  levofloxacin (LEVAQUIN) 250 MG tablet Take 1 tablet (250 mg total) by mouth daily for 7 days. Patient not taking: Reported on 07/20/17 07/02/17 07/09/17  Willy Eddy, MD   Allergies  Allergen Reactions  . Ace Inhibitors     Other reaction(s): Other (See Comments) Other Reaction: Other reaction  . Alendronate     Other reaction(s): Other (See  Comments) Other Reaction: Other reaction  . Eggs Or Egg-Derived Products Other (See Comments)    Unknown, pt just said that she can't eat eggs due to not being able to take flu vaccine  . Influenza Vaccines Other (See Comments)    "kept the flu for a year"    FAMILY HISTORY:  family history is not on file. SOCIAL HISTORY:  reports that she quit smoking about 43 years ago. She has never used smokeless tobacco. She reports that she does not drink alcohol.  REVIEW OF SYSTEMS:   Unable to obtain due to critical illness   VITAL SIGNS: Temp:  [97.8 F (36.6 C)-98.9 F (37.2 C)] 98.9 F (37.2 C) (04/03 0800) Pulse Rate:  [81-129] 102 (04/03 1500) Resp:  [18-38] 26 (04/03 1500) BP: (83-122)/(46-72) 114/67 (04/03 1500) SpO2:  [89 %-98 %] 95 % (04/03 1500) FiO2 (%):  [60 %-100 %] 95 % (04/03 1342)  Physical Examination:  Awake and following commands ToleratingBiPAP, no distress, BEAE andno rales. S1 + S2 audible no murmur Abdomen was normal peristalsis wastedextremities no edema  ASSESSMENT / PLAN: Acute hypoxic respiratory failure on chronic respiratory failure baseline home O2 2 L/min (improved) tolerating BiPAP/ HFNC   -CODE STATUS DNRand consider comfort measures if develops distress.  Pneumoniawith aspiration.worseningairspace diseaseand pulmonary congestion. -Zosynands/pvancomycin MRSA PCR negative -Diuresis to improve lung compliance. -Monitor CXR + CBC + FiO2  Hypoglycemia with dysphagia. -Place DHT and start on TF -Monitor BG.  Altered mental status with hypoxia / meds./ toxic metabolic encephalopathy. -Optimize meds., FIO2 and monitor neuro status  Flu. -Continue Tamiflu for a total of 10 days of therapy.  Hypotension with septic shock [less likely with pro-calcitonin less than 0.1] and meds.  -Optimize medication,consider pressors if systolic blood pressure less than 90 and continue to monitor hemodynamics  A. fib with RVROffamiodarone  drip -Optimize BB -Heparin gtt.  COPD exacerbation -Bronchodilators and taper steroids.  Mild elevation of troponin due to demand versus supply mismatch -Monitor EKG, echocardiogramLV EF 65-70%and cardiac enzymes. -Start on aspirin.  Anxiety on Paxil and Depakote -Monitor Valproic acid level.  Hypokalemia -Replete and monitor electrolytes.  DNR  DVT &GI prophylaxis. Continue with supportive care  Critical care time 35 min

## 2017-07-10 ENCOUNTER — Inpatient Hospital Stay: Payer: Medicare Other

## 2017-07-10 LAB — BLOOD GAS, ARTERIAL
Acid-Base Excess: 12 mmol/L — ABNORMAL HIGH (ref 0.0–2.0)
BICARBONATE: 37.1 mmol/L — AB (ref 20.0–28.0)
Delivery systems: POSITIVE
EXPIRATORY PAP: 6
FIO2: 1
INSPIRATORY PAP: 12
O2 SAT: 97.8 %
PH ART: 7.47 — AB (ref 7.350–7.450)
PO2 ART: 95 mmHg (ref 83.0–108.0)
Patient temperature: 37
pCO2 arterial: 51 mmHg — ABNORMAL HIGH (ref 32.0–48.0)

## 2017-07-10 LAB — CBC
HCT: 28.3 % — ABNORMAL LOW (ref 35.0–47.0)
HEMOGLOBIN: 9.1 g/dL — AB (ref 12.0–16.0)
MCH: 27.3 pg (ref 26.0–34.0)
MCHC: 32.1 g/dL (ref 32.0–36.0)
MCV: 84.9 fL (ref 80.0–100.0)
Platelets: 229 10*3/uL (ref 150–440)
RBC: 3.33 MIL/uL — ABNORMAL LOW (ref 3.80–5.20)
RDW: 15.9 % — AB (ref 11.5–14.5)
WBC: 25.2 10*3/uL — ABNORMAL HIGH (ref 3.6–11.0)

## 2017-07-10 LAB — HEPARIN LEVEL (UNFRACTIONATED)
Heparin Unfractionated: 0.32 IU/mL (ref 0.30–0.70)
Heparin Unfractionated: 0.32 IU/mL (ref 0.30–0.70)

## 2017-07-10 LAB — BASIC METABOLIC PANEL
ANION GAP: 9 (ref 5–15)
BUN: 14 mg/dL (ref 6–20)
CALCIUM: 7.9 mg/dL — AB (ref 8.9–10.3)
CO2: 32 mmol/L (ref 22–32)
Chloride: 95 mmol/L — ABNORMAL LOW (ref 101–111)
Creatinine, Ser: 0.69 mg/dL (ref 0.44–1.00)
GFR calc Af Amer: >60 mL/min (ref >60)
GFR calc non Af Amer: >60 mL/min (ref >60)
GLUCOSE: 175 mg/dL — AB (ref 65–99)
Potassium: 3.8 mmol/L (ref 3.5–5.1)
Sodium: 136 mmol/L (ref 135–145)

## 2017-07-10 LAB — GLUCOSE, CAPILLARY
GLUCOSE-CAPILLARY: 121 mg/dL — AB (ref 65–99)
GLUCOSE-CAPILLARY: 137 mg/dL — AB (ref 65–99)
GLUCOSE-CAPILLARY: 152 mg/dL — AB (ref 65–99)
GLUCOSE-CAPILLARY: 157 mg/dL — AB (ref 65–99)
Glucose-Capillary: 169 mg/dL — ABNORMAL HIGH (ref 65–99)
Glucose-Capillary: 189 mg/dL — ABNORMAL HIGH (ref 65–99)

## 2017-07-10 LAB — VALPROIC ACID LEVEL: VALPROIC ACID LVL: 21 ug/mL — AB (ref 50.0–100.0)

## 2017-07-10 LAB — CALCIUM, IONIZED: Calcium, Ionized, Serum: 4.5 mg/dL (ref 4.5–5.6)

## 2017-07-10 LAB — PHOSPHORUS: Phosphorus: 2.6 mg/dL (ref 2.5–4.6)

## 2017-07-10 LAB — MAGNESIUM: MAGNESIUM: 2.3 mg/dL (ref 1.7–2.4)

## 2017-07-10 MED ORDER — VANCOMYCIN HCL IN DEXTROSE 750-5 MG/150ML-% IV SOLN
750.0000 mg | INTRAVENOUS | Status: DC
  Administered 2017-07-10: 750 mg via INTRAVENOUS
  Filled 2017-07-10 (×2): qty 150

## 2017-07-10 MED ORDER — VALPROATE SODIUM 250 MG/5ML PO SOLN
125.0000 mg | Freq: Three times a day (TID) | ORAL | Status: DC
Start: 2017-07-10 — End: 2017-07-12
  Administered 2017-07-10 – 2017-07-12 (×5): 125 mg
  Filled 2017-07-10 (×7): qty 2.5

## 2017-07-10 MED ORDER — VANCOMYCIN HCL IN DEXTROSE 750-5 MG/150ML-% IV SOLN
750.0000 mg | Freq: Once | INTRAVENOUS | Status: AC
  Administered 2017-07-10: 750 mg via INTRAVENOUS
  Filled 2017-07-10: qty 150

## 2017-07-10 MED ORDER — FUROSEMIDE 10 MG/ML IJ SOLN
20.0000 mg | Freq: Three times a day (TID) | INTRAMUSCULAR | Status: AC
  Administered 2017-07-10 – 2017-07-11 (×5): 20 mg via INTRAVENOUS
  Filled 2017-07-10 (×5): qty 2

## 2017-07-10 MED ORDER — AMIODARONE LOAD VIA INFUSION
150.0000 mg | Freq: Once | INTRAVENOUS | Status: AC
  Administered 2017-07-10: 150 mg via INTRAVENOUS
  Filled 2017-07-10: qty 83.34

## 2017-07-10 MED ORDER — AMIODARONE LOAD VIA INFUSION
150.0000 mg | Freq: Once | INTRAVENOUS | Status: DC
  Filled 2017-07-10: qty 83.34

## 2017-07-10 MED ORDER — AMIODARONE HCL IN DEXTROSE 360-4.14 MG/200ML-% IV SOLN
30.0000 mg/h | INTRAVENOUS | Status: DC
  Administered 2017-07-10 – 2017-07-12 (×2): 30 mg/h via INTRAVENOUS
  Filled 2017-07-10 (×3): qty 200

## 2017-07-10 MED ORDER — MORPHINE SULFATE (PF) 2 MG/ML IV SOLN
1.0000 mg | INTRAVENOUS | Status: AC
  Administered 2017-07-10: 1 mg via INTRAVENOUS

## 2017-07-10 MED ORDER — METHYLPREDNISOLONE SODIUM SUCC 125 MG IJ SOLR
INTRAMUSCULAR | Status: AC
  Administered 2017-07-10: 60 mg via INTRAVENOUS
  Filled 2017-07-10: qty 2

## 2017-07-10 MED ORDER — MORPHINE SULFATE (PF) 2 MG/ML IV SOLN
1.0000 mg | Freq: Once | INTRAVENOUS | Status: AC
  Administered 2017-07-10: 1 mg via INTRAVENOUS
  Filled 2017-07-10: qty 1

## 2017-07-10 MED ORDER — AMIODARONE HCL IN DEXTROSE 360-4.14 MG/200ML-% IV SOLN: 60.0000 mg/h | INTRAVENOUS | Status: DC

## 2017-07-10 MED ORDER — METOPROLOL TARTRATE 5 MG/5ML IV SOLN
5.0000 mg | INTRAVENOUS | Status: AC
  Administered 2017-07-10: 5 mg via INTRAVENOUS
  Filled 2017-07-10: qty 5

## 2017-07-10 MED ORDER — MORPHINE SULFATE (PF) 2 MG/ML IV SOLN
1.0000 mg | INTRAVENOUS | Status: DC | PRN
  Administered 2017-07-10: 1 mg via INTRAVENOUS
  Administered 2017-07-10 – 2017-07-12 (×10): 2 mg via INTRAVENOUS
  Filled 2017-07-10 (×13): qty 1

## 2017-07-10 MED ORDER — AMIODARONE HCL IN DEXTROSE 360-4.14 MG/200ML-% IV SOLN
60.0000 mg/h | INTRAVENOUS | Status: AC
  Filled 2017-07-10 (×2): qty 200

## 2017-07-10 MED ORDER — AMIODARONE HCL IN DEXTROSE 360-4.14 MG/200ML-% IV SOLN: 30.0000 mg/h | INTRAVENOUS | Status: DC

## 2017-07-10 MED ORDER — METHYLPREDNISOLONE SODIUM SUCC 125 MG IJ SOLR
60.0000 mg | INTRAMUSCULAR | Status: AC
  Administered 2017-07-10: 60 mg via INTRAVENOUS

## 2017-07-10 NOTE — Progress Notes (Signed)
ANTICOAGULATION CONSULT NOTE   Pharmacy Consult for heparin drip Indication: atrial fibrillation  Allergies  Allergen Reactions  . Ace Inhibitors     Other reaction(s): Other (See Comments) Other Reaction: Other reaction  . Alendronate     Other reaction(s): Other (See Comments) Other Reaction: Other reaction  . Eggs Or Egg-Derived Products Other (See Comments)    Unknown, pt just said that she can't eat eggs due to not being able to take flu vaccine  . Influenza Vaccines Other (See Comments)    "kept the flu for a year"    Patient Measurements: Height: 5\' 3"  (160 cm) Weight: 106 lb 11.2 oz (48.4 kg) IBW/kg (Calculated) : 52.4 Heparin Dosing Weight: 48.4kg  Vital Signs: BP: 103/56 (04/04 0300) Pulse Rate: 121 (04/04 0628)  Labs: Recent Labs    07/08/17 0530  07/09/17 0047 07/09/17 0941 07/09/17 2032 07/10/17 0627 07/10/17 0628  HGB 9.9*  --  9.6*  --   --  9.1*  --   HCT 29.5*  --  29.7*  --   --  28.3*  --   PLT 185  --  195  --   --  229  --   HEPARINUNFRC 0.24*   < > 0.41 0.23* 0.25*  --  0.32  CREATININE 0.84  --  0.81  --   --   --   --    < > = values in this interval not displayed.    Estimated Creatinine Clearance: 37.4 mL/min (by C-G formula based on SCr of 0.81 mg/dL).   Medical History: Past Medical History:  Diagnosis Date  . Arthritis    hands, knees  . Asthma   . COPD (chronic obstructive pulmonary disease) (HCC)   . Dysrhythmia    A-Fib  . Full dentures    upper and lower  . GERD (gastroesophageal reflux disease)   . Hypercholesteremia   . Hypertension   . Osteoporosis   . Paroxysmal atrial fibrillation (HCC)   . Shortness of breath dyspnea     Medications:  Scheduled:  . budesonide (PULMICORT) nebulizer solution  0.25 mg Nebulization BID  . furosemide  20 mg Intravenous Daily  . insulin aspart  0-9 Units Subcutaneous TID WC  . levalbuterol  0.63 mg Nebulization Q6H  . mouth rinse  15 mL Mouth Rinse q12n4p  . methylPREDNISolone  (SOLU-MEDROL) injection  60 mg Intravenous STAT  . methylPREDNISolone sodium succinate      . metoprolol tartrate  25 mg Per NG tube BID  .  morphine injection  1 mg Intravenous STAT  . pantoprazole (PROTONIX) IV  40 mg Intravenous QHS  . PARoxetine  20 mg Oral Daily   Infusions:  . dextrose 25 mL/hr at 07/10/17 0300  . feeding supplement (GLUCERNA 1.2 CAL) 20 mL/hr at 07/10/17 0300  . heparin 1,050 Units/hr (07/10/17 0300)  . piperacillin-tazobactam (ZOSYN)  IV Stopped (07/10/17 0230)  . valproate sodium Stopped (07/10/17 0000)  . vancomycin    . vancomycin      Assessment: Pharmacy consulted for heparin drip management for 84 yo female on heparin drip for new onset atrial fibrillation. Patient with CHADS2VASc of 5.  -Goal of Therapy:  Heparin level 0.3-0.7 units/ml Monitor platelets by anticoagulation protocol: Yes   Plan:  Will bolus heparin 700 units and increase infusion to 850 units/hr. Although this mimics the previous bolus and increase, will not pursue more aggressive increase at this time due to age and stature. Will recheck a HL in 8  hours.   04/03 0100 heparin level 0.41. Continue current regimen. Recheck in 8 hours to confirm.  04/03 2030 HL = 0.25.   Will order Heparin 700 units IV X 1 bolus and increase drip rate to 1050 units/hr.  Will recheck HL 8 hrs after rate change.   4/4 AM heparin level 0.32. Continue current regimen. Recheck in 8 hours to confirm.  Ethen Bannan S 07/10/17 6:58 AM

## 2017-07-10 NOTE — Progress Notes (Signed)
Progress Note  Patient Name: Crystal Todd Date of Encounter: 07/10/2017  Primary Cardiologist: Lorine Bears, MD  Subjective   Patient remains on BiPAP.  A. fib rates higher yesterday with switch from IV to p.o./via tube metoprolol.  Currently in the 1 teens but trending at times into the 140s.  She is fairly lethargic but does awaken and answer simple yes/no questions on prompting.  Family at bedside.  Inpatient Medications    Scheduled Meds: . amiodarone  150 mg Intravenous Once  . budesonide (PULMICORT) nebulizer solution  0.25 mg Nebulization BID  . furosemide  20 mg Intravenous Daily  . insulin aspart  0-9 Units Subcutaneous TID WC  . levalbuterol  0.63 mg Nebulization Q6H  . mouth rinse  15 mL Mouth Rinse q12n4p  . metoprolol tartrate  25 mg Per NG tube BID  . pantoprazole (PROTONIX) IV  40 mg Intravenous QHS  . PARoxetine  20 mg Oral Daily   Continuous Infusions: . amiodarone     Followed by  . amiodarone    . dextrose 25 mL/hr at 07/10/17 0300  . feeding supplement (GLUCERNA 1.2 CAL) 20 mL/hr at 07/10/17 0300  . heparin 1,050 Units/hr (07/10/17 0300)  . piperacillin-tazobactam (ZOSYN)  IV 3.375 g (07/10/17 0630)  . valproate sodium 125 mg (07/10/17 0630)  . vancomycin     PRN Meds: acetaminophen **OR** acetaminophen, alum & mag hydroxide-simeth, bisacodyl, guaiFENesin, levalbuterol, magnesium hydroxide, morphine injection, ondansetron **OR** ondansetron (ZOFRAN) IV, senna-docusate   Vital Signs    Vitals:   07/10/17 0628 07/10/17 0700 07/10/17 0800 07/10/17 0900  BP:   113/63 97/68  Pulse: (!) 121 (!) 156 (!) 114 90  Resp: (!) 41 (!) 28 (!) 24 (!) 24  Temp:      TempSrc:      SpO2: (!) 89% (!) 86% 96% 97%  Weight:      Height:        Intake/Output Summary (Last 24 hours) at 07/10/2017 1135 Last data filed at 07/10/2017 0300 Gross per 24 hour  Intake 1105.23 ml  Output -  Net 1105.23 ml   Filed Weights   01-Aug-2017 2310  Weight: 106 lb 11.2 oz  (48.4 kg)    Physical Exam   GEN: Frail.  On BiPAP. Neck: Supple, no JVD, carotid bruits, or masses. Cardiac: IR, IR, tachy, no murmurs, rubs, or gallops. No clubbing, cyanosis, edema.  Radials/DP/PT 2+ and equal bilaterally.  Respiratory:  Respirations regular and unlabored, coarse breath sounds bilaterally. GI: Soft, nontender, nondistended, BS + x 4. MS: no deformity or atrophy. Skin: warm and dry, no rash. Neuro:  Strength and sensation are intact. Psych: Flat affect.  She is resting quietly with her eyes closed but will open her eyes and try and answer yes/no questions as best to her ability considering BiPAP is in place.  Labs    Chemistry Recent Labs  Lab 01-Aug-2017 2017  07/06/17 0150  07/08/17 0530 07/09/17 0047 07/10/17 0627  NA 136   < > 137   < > 138 135 136  K 4.5   < > 4.6   < > 3.6 3.2* 3.8  CL 101   < > 106   < > 100* 94* 95*  CO2 22   < > 21*   < > 31 31 32  GLUCOSE 161*   < > 207*   < > 119* 159* 175*  BUN 23*   < > 23*   < > 22* 13 14  CREATININE 0.93   < > 0.75   < > 0.84 0.81 0.69  CALCIUM 7.8*   < > 7.8*   < > 7.8* 7.9* 7.9*  PROT 6.3*  --  5.9*  --   --   --   --   ALBUMIN 2.8*  --  2.6*  --   --   --   --   AST 21  --  39  --   --   --   --   ALT 19  --  52  --   --   --   --   ALKPHOS 68  --  66  --   --   --   --   BILITOT 1.0  --  0.4  --   --   --   --   GFRNONAA 54*   < > >60   < > >60 >60 >60  GFRAA >60   < > >60   < > >60 >60 >60  ANIONGAP 13   < > 10   < > 7 10 9    < > = values in this interval not displayed.     Hematology Recent Labs  Lab 07/08/17 0530 07/09/17 0047 07/10/17 0627  WBC 14.6* 21.1* 25.2*  RBC 3.57* 3.52* 3.33*  HGB 9.9* 9.6* 9.1*  HCT 29.5* 29.7* 28.3*  MCV 82.5 84.4 84.9  MCH 27.7 27.2 27.3  MCHC 33.6 32.2 32.1  RDW 16.5* 16.0* 15.9*  PLT 185 195 229    Cardiac Enzymes Recent Labs  Lab 06/09/2017 2017 07/05/17 0751 07/05/17 1404 07/05/17 1917  TROPONINI 0.06* <0.03 <0.03 <0.03      Radiology    Dg  Chest Port 1 View  Result Date: 07/10/2017 CLINICAL DATA:  Pneumonia EXAM: PORTABLE CHEST 1 VIEW COMPARISON:  07/09/2017 FINDINGS: Progression of severe bilateral airspace disease right greater than left. Left lower lobe consolidation compatible with atelectasis unchanged. No significant effusion. Feeding tube enters the stomach with the tip not visualized. IMPRESSION: Severe bilateral airspace disease, with progression Electronically Signed   By: 09/08/2017 M.D.   On: 07/10/2017 07:18   Dg Chest Port 1 View  Result Date: 07/09/2017 CLINICAL DATA:  Shortness of Breath EXAM: PORTABLE CHEST 1 VIEW COMPARISON:  July 08, 2017 FINDINGS: There is airspace consolidation throughout much of the right upper lobe, concerning for pneumonia. Lungs elsewhere appear clear. There is cardiomegaly with pulmonary venous hypertension. No adenopathy. There is aortic atherosclerosis. No adenopathy evident. Feeding tube tip is below the diaphragm. Bones are diffusely osteoporotic. There is evidence of an old healed fracture of the left clavicle. IMPRESSION: Airspace opacity right upper lobe concerning for pneumonia. Lungs elsewhere clear. There is underlying pulmonary vascular congestion. There is aortic atherosclerosis. Aortic Atherosclerosis (ICD10-I70.0). Electronically Signed   By: July 10, 2017 III M.D.   On: 07/09/2017 07:53   Dg Abd Portable 1v  Result Date: 07/08/2017 CLINICAL DATA:  Check Dobbhoff placement EXAM: PORTABLE ABDOMEN - 1 VIEW COMPARISON:  None. FINDINGS: Scattered large and small bowel gas is noted. Changes of cholelithiasis are seen. A feeding catheter is noted within the stomach in the midportion. Changes of prior vertebral augmentation and right hip fixation are noted. IMPRESSION: Dobbhoff catheter in the mid stomach Electronically Signed   By: 09/07/2017 M.D.   On: 07/08/2017 15:50    Telemetry    A. fib with RVR with rates extending up to the 140s.- Personally Reviewed  Cardiac Studies    TTE (  07/06/17): - Left ventricle: The cavity size was normal. Wall thickness was normal. Systolic function was vigorous. The estimated ejection fraction was in the range of 65% to 70%. Left ventricular diastolic function parameters were normal. - Mitral valve: There was moderate regurgitation. - Left atrium: The atrium was moderately dilated. - Right atrium: The atrium was mildly dilated. - Tricuspid valve: There was moderate regurgitation. - Pulmonary arteries: PA peak pressure: 50 mm Hg (S).  Patient Profile     84 y.o.femalewith history of HTN, HLD, OA, and asthma who was admitted to [facility] with influenza PNA and noted to be in Afib with RVR.  Assessment & Plan    1.  Atrial fibrillation with rapid ventricular response: In the setting of influenza and right upper lobe pneumonia.  Rates higher over the past 24 hours, rising into the 140s at times.  Blood pressure remains soft.  Suspect elevated rates are largely driven by respiratory failure but also contributing to mild volume excess.  In that setting, we will add back amiodarone for rate control.  Continue beta-blocker as tolerated.  Continue heparin for anticoagulation with plan to transition to Eliquis 2.5 twice daily once she is off of BiPAP and able to take p.o.'s.  2.  Acute respiratory failure/influenza: Multifactorial in the setting of right upper lobe pneumonia, influenza, diastolic failure, and rapid A. fib.  She has been requiring BiPAP for greater than 24 hours.  Antibiotics per internal medicine.  Continue low-dose Lasix.  3.  Acute on chronic diastolic congestive heart failure/pulmonary arterial hypertension: EF 65-70% by echo this admission.  Vascular congestion noted on chest x-ray in the setting of worsening hypoxia yesterday morning. Still net positive.  Renal function stable.  Continue low-dose Lasix.  Adding amiodarone for improved heart rate control.  4.  Anemia: Stable.  Signed, Nicolasa Ducking, NP   07/10/2017, 11:35 AM    For questions or updates, please contact   Please consult [email or web address] for contact info under Cardiology/STEMI.

## 2017-07-10 NOTE — Progress Notes (Addendum)
Daily Progress Note   Patient Name: Crystal Todd       Date: 07/10/2017 DOB: [birth year]  Age: 84 y.o. MRN#: [id number] Attending Physician: Adrian Saran, MD Primary Care Physician: Patient, No Pcp Per Admit Date: 07-13-17  Reason for Consultation/Follow-up: Establishing goals of care  Subjective: Patient resting in bed on BiPAP.  Asked her how she was feeling, she states "not good".   She tells me her name, her age, where she is, and that she is here because of her lungs.  Asked her thoughts of coming to a point of discontinuing BiPAP and providing comfort care.  She states "I think that would be a good idea." Discussed code status and asked if she would want chest compressions, shocks, or a breathing tube, and she says "no". Discussed plan to speak with her niece which she is okay with.   Spoke with CCM and discussed patient conversation. Per CCM, question of decision making capacity, and they have spoken with niece and plans for comfort care in 48 hours with transition sooner if she is uncomfortable, after speaking with her first. Attempted to contact niece to discuss and offer support, but unable to reach her.  Length of Stay: 7  Current Medications: Scheduled Meds:  . budesonide (PULMICORT) nebulizer solution  0.25 mg Nebulization BID  . furosemide  20 mg Intravenous Q8H  . insulin aspart  0-9 Units Subcutaneous TID WC  . levalbuterol  0.63 mg Nebulization Q6H  . mouth rinse  15 mL Mouth Rinse q12n4p  . metoprolol tartrate  25 mg Per NG tube BID  . pantoprazole (PROTONIX) IV  40 mg Intravenous QHS  . PARoxetine  20 mg Oral Daily  . Valproate Sodium  125 mg Per Tube Q8H    Continuous Infusions: . amiodarone 60 mg/hr (07/10/17 1200)   Followed by  . amiodarone    . dextrose 25  mL/hr at 07/10/17 0300  . feeding supplement (GLUCERNA 1.2 CAL) 20 mL/hr at 07/10/17 0300  . heparin 1,050 Units/hr (07/10/17 0300)  . piperacillin-tazobactam (ZOSYN)  IV 3.375 g (07/10/17 1404)  . vancomycin      PRN Meds: acetaminophen **OR** acetaminophen, alum & mag hydroxide-simeth, bisacodyl, guaiFENesin, levalbuterol, magnesium hydroxide, morphine injection, ondansetron **OR** ondansetron (ZOFRAN) IV, senna-docusate  Physical Exam  Constitutional: No distress.  Appears tired.  Pulmonary/Chest:  BIPAP  Neurological: She is alert.  Skin: Skin is warm and dry.            Vital Signs: BP 102/68   Pulse (!) 105   Temp 98.6 F (37 C) (Axillary)   Resp (!) 23   Ht 5\' 3"  (1.6 m)   Wt 48.4 kg (106 lb 11.2 oz)   SpO2 99%   BMI 18.90 kg/m  SpO2: SpO2: 99 % O2 Device: O2 Device: Bi-PAP O2 Flow Rate: O2 Flow Rate (L/min): 100 L/min  Intake/output summary:   Intake/Output Summary (Last 24 hours) at 07/10/2017 1630 Last data filed at 07/10/2017 1500 Gross per 24 hour  Intake 1211.65 ml  Output -  Net 1211.65 ml   LBM: Last BM Date: 07/06/17 Baseline Weight: Weight: 48.4 kg (106 lb 11.2 oz) Most recent weight: Weight: 48.4 kg (106 lb 11.2 oz)       Palliative Assessment/Data:  20%      Patient Active Problem List   Diagnosis Date Noted  . Malnutrition of moderate degree 07/09/2017  . Atrial fibrillation with RVR (HCC)   . Acute diastolic congestive heart failure (HCC)   . Pressure injury of skin 07/06/2017  . Acute respiratory failure with hypoxia (HCC) 06/17/2017  . PNA (pneumonia) 06/26/2017    Palliative Care Assessment & Plan   Patient Profile: Crystal Todd  is a 84 y.o. female with a known history of recent influenza A, hypertension, paroxysmal A. fib, COPD, hyperlipidemia and arthritis.  The patient was sent from skilled nursing facility due to SOB.   Assessment/ Recommendations/Plan: Per EMR and plans per CCM  MD: " I had a meeting with the family,  Niece POA, they agreed to continue with BiPAP for 48 hs. Considering comfort care and withdraw of support if started having distress or failure to wean off BiPAP after 4 8h after being notified."   This 97 has been unable to reach [place].     Code Status:    Code Status Orders  (From admission, onward)        Start     Ordered   06/16/2017 2249  Do not attempt resuscitation (DNR)  Continuous    Question Answer Comment  In the event of cardiac or respiratory ARREST Do not call a "code blue"   In the event of cardiac or respiratory ARREST Do not perform Intubation, CPR, defibrillation or ACLS   In the event of cardiac or respiratory ARREST Use medication by any route, position, wound care, and other measures to relive pain and suffering. May use oxygen, suction and manual treatment of airway obstruction as needed for comfort.      07/06/2017 2248    Code Status History    Date Active Date Inactive Code Status Order ID Comments User Context   06/26/2017 2150 07/01/2017 0033 Full Code 06/14/2017  [id number], MD Inpatient    Advance Directive Documentation     Most Recent Value  Type of Advance Directive  Healthcare Power of Attorney  Pre-existing out of facility DNR order (yellow form or pink MOST form)  -  "MOST" Form in Place?  -       Prognosis:  Poor  Discharge Planning:  To Be Determined  Care plan was discussed with CCM  Thank you for allowing the Palliative Medicine Team to assist in the care of this patient.   Total Time 25 min Prolonged Time Billed  No      Greater than 50%  of this time was spent counseling and coordinating care related to the above assessment and plan.  Asencion Gowda, NP  Please contact Palliative Medicine Team phone at [phone] for questions and concerns.

## 2017-07-10 NOTE — Progress Notes (Signed)
Name: Crystal Todd MRN: [id number] DOB: 13-Aug-1927     CONSULTATION DATE: 06/13/2017  Continues to be on BiPAP, on Heparin and Amiodarone.  PAST MEDICAL HISTORY :   has a past medical history of Arthritis, Asthma, COPD (chronic obstructive pulmonary disease) (HCC), Dysrhythmia, Full dentures, GERD (gastroesophageal reflux disease), Hypercholesteremia, Hypertension, Osteoporosis, Paroxysmal atrial fibrillation (HCC), and Shortness of breath dyspnea.  has a past surgical history that includes Back surgery; Esophagogastroduodenoscopy (egd) with esophageal dilation; Joint replacement; Wrist fracture surgery (Bilateral); and Cataract extraction w/PHACO (Left, 10/12/2014). Prior to Admission medications   Medication Sig Start Date End Date Taking? Authorizing Provider  acetaminophen (TYLENOL) 325 MG tablet Take 650 mg by mouth every 4 (four) hours as needed for fever (up to 101).   Yes [provider]  albuterol (PROVENTIL HFA;VENTOLIN HFA) 108 (90 Base) MCG/ACT inhaler Inhale 2 puffs into the lungs every 6 (six) hours as needed for wheezing or shortness of breath. 07/02/17  Yes Willy Eddy, MD  alum & mag hydroxide-simeth (ANTACID) 200-200-20 MG/5ML suspension Take 30 mLs by mouth every 6 (six) hours as needed for indigestion or heartburn.   Yes [provider]  amLODipine (NORVASC) 5 MG tablet Take 5 mg by mouth daily.   Yes [provider]  cholecalciferol (VITAMIN D) 1000 units tablet Take 1,000 Units by mouth daily.   Yes [provider]  diphenhydrAMINE (BENADRYL) 25 MG tablet Take 25 mg by mouth every 6 (six) hours as needed for allergies.   Yes [provider]  divalproex (DEPAKOTE SPRINKLE) 125 MG capsule Take 125-250 mg by mouth 2 (two) times daily. 125 mg every morning and 250 mg at bedtime   Yes [provider]  fluticasone (FLONASE) 50 MCG/ACT nasal spray Place 2 sprays into the nose 2 (two) times daily.   Yes [provider]  guaiFENesin (ROBITUSSIN) 100 MG/5ML liquid Take 300 mg by mouth every 6 (six) hours as needed for cough.   Yes [provider]  ipratropium-albuterol (DUONEB) 0.5-2.5 (3) MG/3ML SOLN Take 3 mLs by nebulization every 6 (six) hours as needed (SOB or wheezing). Patient taking differently: Take 3 mLs by nebulization every 6 (six) hours as needed. For wheezing or shortness of breath 06/30/17  Yes Sudini, Wardell Heath, MD  loperamide (IMODIUM A-D) 2 MG capsule Take 4 mg by mouth as needed for diarrhea or loose stools.   Yes [provider]  magnesium hydroxide (MILK OF MAGNESIA) 400 MG/5ML suspension Take 30 mLs by mouth daily as needed for mild constipation.   Yes [provider]  metoprolol tartrate (LOPRESSOR) 25 MG tablet Take 12.5 mg by mouth 2 (two) times daily.   Yes [provider]  multivitamin (PROSIGHT) TABS tablet Take 1 tablet by mouth daily.   Yes [provider]  omeprazole (PRILOSEC) 20 MG capsule Take 20 mg by mouth daily.   Yes [provider]  oseltamivir (TAMIFLU) 30 MG capsule Take 1 capsule (30 mg total) by mouth 2 (two) times daily. 06/30/17  Yes Sudini, Wardell Heath, MD  PARoxetine (PAXIL) 20 MG tablet Take 20 mg by mouth daily.   Yes [provider]  predniSONE (DELTASONE) 10 MG tablet Take 10 mg by mouth 4 (four) times daily. For 2 days, then 3 times daily for 2 days, then 2 times daily for 2 days, then daily for 2 days, then stop 07/01/17  Yes [provider]  vitamin B-12 (CYANOCOBALAMIN) 1000 MCG tablet Take 1,000 mcg by mouth daily.   Yes  [provider]   Allergies  Allergen Reactions  . Ace Inhibitors     Other reaction(s): Other (See Comments) Other Reaction: Other reaction  . Alendronate     Other reaction(s): Other (See Comments) Other Reaction: Other reaction  . Eggs Or Egg-Derived Products Other (See Comments)    Unknown, pt just said that she can't eat eggs due to not being able to  take flu vaccine  . Influenza Vaccines Other (See Comments)    "kept the flu for a year"    FAMILY HISTORY:  family history is not on file. SOCIAL HISTORY:  reports that she quit smoking about 43 years ago. She has never used smokeless tobacco. She reports that she does not drink alcohol.  REVIEW OF SYSTEMS:   Unable to obtain due to critical illness   VITAL SIGNS: Temp:  [98.2 F (36.8 C)] 98.2 F (36.8 C) (04/03 1600) Pulse Rate:  [90-156] 90 (04/04 0900) Resp:  [21-45] 24 (04/04 0900) BP: (83-126)/(54-75) 97/68 (04/04 0900) SpO2:  [86 %-97 %] 97 % (04/04 0900) FiO2 (%):  [80 %-95 %] 80 % (04/04 0138)  Physical Examination:  Awake and following commands ToleratingBiPAP 12/6/100%, no distress, BEAE andno rales. S1 + S2 audible no murmur Abdomen was normal peristalsis wastedextremities no edema  ASSESSMENT / PLAN: Acute hypoxic respiratory failure on chronic respiratory failure baseline home O2 2 L/min (improved) tolerating BiPAP 12/6/100%. -CODE STATUS DNR. I had a meeting with the family, Niece POA, they agreed to continue with BiPAP for 48 hs. Considering comfort care and withdraw of support if started having distress or failure to wean off BiPAP after 4 8h after being notified.  Pneumoniawith aspiration.worseningairspace diseaseand pulmonary congestion. -Zosyn,  Consider D/C vanc (was restarted on 07/09/2017) if no SIRS within the next 24 h, MRSA PCR negative -Diuresis to improve lung compliance. -Monitor CXR + CBC + FiO2  Hypoglycemia with dysphagia. -Place DHT and start on TF -Monitor BG.  Altered mental status (improved) with hypoxia / meds./ toxic metabolic encephalopathy. -Optimize meds., FIO2 and monitor neuro status  Flu. -Completed Tamiflu for a total of 10 days of therapy.  A. fib with RVROn amiodarone drip -Optimize BB -Heparin gtt.  COPD exacerbation -Bronchodilators and taper steroids.  Mild elevation of troponin due to demand  versus supply mismatch -Monitor EKG, echocardiogramLV EF 65-70%and cardiac enzymes. -Start on aspirin.  Anxiety on Paxil and Depakote -Monitor Valproic acid level.  DNR  DVT &GI prophylaxis. Continue with supportive care  Critical care time 35 min

## 2017-07-10 NOTE — Clinical Social Work Note (Signed)
CSW has noted that family is wanting to see how patient does over the next 48 hours and is considering comfort care if no improvement. York Spaniel MSW,LCSW [phone]

## 2017-07-10 NOTE — Progress Notes (Signed)
Pharmacy Antibiotic Note  Crystal Todd is a 84 y.o. female admitted on 06/17/2017 with pneumonia.  Pharmacy has been consulted for vancomycin dosing.  Plan: DW 48kg  Vd 34L kei 0.035 hr-1  T1/2 20 hours.  Vancomycin 750 mg q 24 hours ordered with stacked dosing. Level before 5th dose. Goal trough 15-20.  Height: 5\' 3"  (160 cm) Weight: 106 lb 11.2 oz (48.4 kg) IBW/kg (Calculated) : 52.4  Temp (24hrs), Avg:98.6 F (37 C), Min:98.2 F (36.8 C), Max:98.9 F (37.2 C)  Recent Labs  Lab 07/06/2017 1903 06/23/2017 2008  07/04/17 0549 07/06/17 0150 07/07/17 0509 07/08/17 0530 07/09/17 0047  WBC  --   --    < > 9.1 13.3* 25.6* 14.6* 21.1*  CREATININE  --   --    < > 0.71 0.75 0.80 0.84 0.81  LATICACIDVEN 1.7 1.6  --   --   --   --   --   --    < > = values in this interval not displayed.    Estimated Creatinine Clearance: 37.4 mL/min (by C-G formula based on SCr of 0.81 mg/dL).    Allergies  Allergen Reactions  . Ace Inhibitors     Other reaction(s): Other (See Comments) Other Reaction: Other reaction  . Alendronate     Other reaction(s): Other (See Comments) Other Reaction: Other reaction  . Eggs Or Egg-Derived Products Other (See Comments)    Unknown, pt just said that she can't eat eggs due to not being able to take flu vaccine  . Influenza Vaccines Other (See Comments)    "kept the flu for a year"    Antimicrobials this admission: Zosyn 3/31, Vancomycin 4/4  >>    >>   Dose adjustments this admission:   Microbiology results: 3/28 BCx: NG x5d 3/28 MRSA PCR: (-)      4/3 CXR: Airspace opacity right upper lobe concerning for pneumonia Thank you for allowing pharmacy to be a part of this patient's care.  Addam Goeller S 07/10/2017 6:22 AM

## 2017-07-10 NOTE — Progress Notes (Signed)
Pt converted into NSR.  

## 2017-07-10 NOTE — Progress Notes (Signed)
Pt severely tachypneic respiratory rate 30-40's on 80% Bipap, telemetry rhythm atrial fibrillation with rvr despite multiple doses of iv metoprolol.  Therefore, administered iv solumedrol, xopenex, 20 mg iv lasix, 1 mg iv morphine for air hunger, and indwelling foley catheter placed for strict intake and output.  Attempted to contact pts niece via telephone Concha Pyo regarding change in pt condition and to discuss goals of treatment, however unable to speak with her left voicemail message.  Will continue to monitor and assess pt.    Sonda Rumble, AGNP  Pulmonary/Critical Care Pager [phone] (please enter 7 digits) PCCM Consult Pager [phone] (please enter 7 digits)

## 2017-07-10 NOTE — Progress Notes (Signed)
[employer] [facility] [place] [facility]   PATIENT NAME: Crystal Todd    MR#:  [id number]  DATE OF BIRTH:  February 15, 1928  SUBJECTIVE:   Patient letharigc with increased WOB this am   Rhythm strip is showing atrial fibrillation with RVR.  Patient received multiple doses of IV metoprolol.  She also received Solu-Medrol and Lasix.   REVIEW OF SYSTEMS:    Unable to obtain patient on Bipap lethargic this am   Tolerating Diet: N.p.o.    DRUG ALLERGIES:   Allergies  Allergen Reactions  . Ace Inhibitors     Other reaction(s): Other (See Comments) Other Reaction: Other reaction  . Alendronate     Other reaction(s): Other (See Comments) Other Reaction: Other reaction  . Eggs Or Egg-Derived Products Other (See Comments)    Unknown, pt just said that she can't eat eggs due to not being able to take flu vaccine  . Influenza Vaccines Other (See Comments)    "kept the flu for a year"    VITALS:  Blood pressure 126/66, pulse (!) 156, temperature 98.2 F (36.8 C), temperature source Axillary, resp. rate (!) 28, height 5\' 3"  (1.6 m), weight 48.4 kg (106 lb 11.2 oz), SpO2 (!) 86 %.  PHYSICAL EXAMINATION:  Constitutional: Appears thin and frail his respiratory effort is moderate distress hENT: Normocephalic. . On BIpap Eyes: Conjunctivae and EOM are normal. PERRLA, no scleral icterus.  Neck:  Neck supple. No JVD. No tracheal deviation. CVS: irr, irr tachy , no murmurs, no gallops, no carotid bruit.  Pulmonary: b/l coarse rhonchi no wheezing  Abdominal: Soft. BS +,  no distension, tenderness, rebound or guarding.  Musculoskeletal: . No edema and no tenderness.  Neuro: on bipap Skin: Skin is warm and dry. No rash noted. Psychiatric: Lethargic  LABORATORY PANEL:   CBC Recent Labs  Lab 07/10/17 0627  WBC 25.2*  HGB 9.1*  HCT 28.3*  PLT 229    ------------------------------------------------------------------------------------------------------------------  Chemistries  Recent Labs  Lab 07/06/17 0150  07/10/17 0627  NA 137   < > 136  K 4.6   < > 3.8  CL 106   < > 95*  CO2 21*   < > 32  GLUCOSE 207*   < > 175*  BUN 23*   < > 14  CREATININE 0.75   < > 0.69  CALCIUM 7.8*   < > 7.9*  MG 2.4   < > 2.3  AST 39  --   --   ALT 52  --   --   ALKPHOS 66  --   --   BILITOT 0.4  --   --    < > = values in this interval not displayed.   ------------------------------------------------------------------------------------------------------------------  Cardiac Enzymes Recent Labs  Lab 07/05/17 0751 07/05/17 1404 07/05/17 1917  TROPONINI <0.03 <0.03 <0.03   ------------------------------------------------------------------------------------------------------------------  RADIOLOGY:  Dg Chest Port 1 View  Result Date: 07/10/2017 CLINICAL DATA:  Pneumonia EXAM: PORTABLE CHEST 1 VIEW COMPARISON:  07/09/2017 FINDINGS: Progression of severe bilateral airspace disease right greater than left. Left lower lobe consolidation compatible with atelectasis unchanged. No significant effusion. Feeding tube enters the stomach with the tip not visualized. IMPRESSION: Severe bilateral airspace disease, with progression Electronically Signed   By: 09/08/2017 M.D.   On: 07/10/2017 07:18   Dg Chest Port 1 View  Result Date: 07/09/2017 CLINICAL DATA:  Shortness of Breath EXAM: PORTABLE CHEST 1 VIEW COMPARISON:  July 08, 2017 FINDINGS: There is airspace consolidation throughout  much of the right upper lobe, concerning for pneumonia. Lungs elsewhere appear clear. There is cardiomegaly with pulmonary venous hypertension. No adenopathy. There is aortic atherosclerosis. No adenopathy evident. Feeding tube tip is below the diaphragm. Bones are diffusely osteoporotic. There is evidence of an old healed fracture of the left clavicle. IMPRESSION: Airspace  opacity right upper lobe concerning for pneumonia. Lungs elsewhere clear. There is underlying pulmonary vascular congestion. There is aortic atherosclerosis. Aortic Atherosclerosis (ICD10-I70.0). Electronically Signed   By: Bretta Bang III M.D.   On: 07/09/2017 07:53   Dg Abd Portable 1v  Result Date: 07/08/2017 CLINICAL DATA:  Check Dobbhoff placement EXAM: PORTABLE ABDOMEN - 1 VIEW COMPARISON:  None. FINDINGS: Scattered large and small bowel gas is noted. Changes of cholelithiasis are seen. A feeding catheter is noted within the stomach in the midportion. Changes of prior vertebral augmentation and right hip fixation are noted. IMPRESSION: Dobbhoff catheter in the mid stomach Electronically Signed   By: Alcide Clever M.D.   On: 07/08/2017 15:50     ASSESSMENT AND PLAN:   84 year old female with history of COPD, essential hypertension and PAF with recent discharge from the hospital for influenza A and COPD who presented with shortness of breath.  1.  Septic shock with acute hypoxic respiratory failure Patient presented with hypotension, tachycardia, leukocytosis and fever  Sepsis is due to pneumonia and influenza A There is concern for aspiration causing acute hypoxic respiratory failure. Continue Zosyn for aspiration pneumonia Vancomycin ordered this morning as patient with increased respiratory distress  She has completed treatment with Tamiflu for influenza.  NPO for high risk of aspiration as per Speech consult Chest x-ray this morning shows bilateral progression of underlying pneumonia  2.  Pneumonia: Initially patient was treated for healthcare acquired pneumonia however it appears now that patient is aspirating.  Antibiotics changed from cefepime to Zosyn (day 5) day 1 of vancomycin  MRSA PCR negative   3.  Influenza A: She has completed treatment with Tamiflu. 4.  Septic shock: Blood pressure tolerable  5.  PAF with RVR Continue metoprolol and heparin gtt as per  Cardiology Echo shows normal ejection fraction with moderate mitral regurgitation and elevated PA pressures Appreciate cardiology consultation TSH was wnml   6.  Acute COPD exacerbation due to pneumonia and influenza:  Patient given 1 dose of IV steroids this morning.   Continue levalbuterol   7.  Elevated troponin due to demand ischemia: Patient has been ruled out for ACS.  8. Elevated blood sugar due to steroids A1c is 5.8   9.  Acute diastolic heart failure with preserved ejection fraction: Patient appears to be Euvolemic  10. Aspiration risk: Patient is NPO and high risk of aspiration. She is DNR. Palliative care consult appreciated.  11. Hypokalemia: This will be repleted.  12.  Elevated WBC: Continue to monitor Vancomycin ordered today  She with very poor overall prognosis.  She is DNR.  She has increased respiratory effort. Suggest we move to comfort care.    CODE STATUS: DNR  TOTAL TIME TAKING CARE OF THIS PATIENT: 24 minutes.       POSSIBLE D/C?? DEPENDING ON CLINICAL CONDITION.   Visente Kirker M.D on 07/10/2017 at 8:44 AM  Between 7am to 6pm - Pager - [phone] After 6pm go to [email or web address] - password [email or web address] [facility]  [facility]  Office  [phone]  CC: Primary care physician; Patient, No Pcp Per  Note: This dictation was prepared with Dragon dictation along with smaller phrase  technology. Any transcriptional errors that result from this process are unintentional.

## 2017-07-10 NOTE — Progress Notes (Addendum)
ANTICOAGULATION CONSULT NOTE   Pharmacy Consult for heparin drip Indication: atrial fibrillation  Allergies  Allergen Reactions  . Ace Inhibitors     Other reaction(s): Other (See Comments) Other Reaction: Other reaction  . Alendronate     Other reaction(s): Other (See Comments) Other Reaction: Other reaction  . Eggs Or Egg-Derived Products Other (See Comments)    Unknown, pt just said that she can't eat eggs due to not being able to take flu vaccine  . Influenza Vaccines Other (See Comments)    "kept the flu for a year"    Patient Measurements: Height: 5\' 3"  (160 cm) Weight: 106 lb 11.2 oz (48.4 kg) IBW/kg (Calculated) : 52.4 Heparin Dosing Weight: 48.4kg  Vital Signs: Temp: 98.6 F (37 C) (04/04 1200) Temp Source: Axillary (04/04 1200) BP: 102/68 (04/04 1500) Pulse Rate: 105 (04/04 1500)  Labs: Recent Labs    07/08/17 0530  07/09/17 0047  07/09/17 2032 07/10/17 0627 07/10/17 0628 07/10/17 1519  HGB 9.9*  --  9.6*  --   --  9.1*  --   --   HCT 29.5*  --  29.7*  --   --  28.3*  --   --   PLT 185  --  195  --   --  229  --   --   HEPARINUNFRC 0.24*   < > 0.41   < > 0.25*  --  0.32 0.32  CREATININE 0.84  --  0.81  --   --  0.69  --   --    < > = values in this interval not displayed.    Estimated Creatinine Clearance: 37.9 mL/min (by C-G formula based on SCr of 0.69 mg/dL).   Medical History: Past Medical History:  Diagnosis Date  . Arthritis    hands, knees  . Asthma   . COPD (chronic obstructive pulmonary disease) (HCC)   . Dysrhythmia    A-Fib  . Full dentures    upper and lower  . GERD (gastroesophageal reflux disease)   . Hypercholesteremia   . Hypertension   . Osteoporosis   . Paroxysmal atrial fibrillation (HCC)   . Shortness of breath dyspnea     Medications:  Scheduled:  . budesonide (PULMICORT) nebulizer solution  0.25 mg Nebulization BID  . furosemide  20 mg Intravenous Q8H  . insulin aspart  0-9 Units Subcutaneous TID WC  .  levalbuterol  0.63 mg Nebulization Q6H  . mouth rinse  15 mL Mouth Rinse q12n4p  . metoprolol tartrate  25 mg Per NG tube BID  . pantoprazole (PROTONIX) IV  40 mg Intravenous QHS  . PARoxetine  20 mg Oral Daily  . Valproate Sodium  125 mg Per Tube Q8H   Infusions:  . amiodarone 60 mg/hr (07/10/17 1200)   Followed by  . amiodarone    . dextrose 25 mL/hr at 07/10/17 0300  . feeding supplement (GLUCERNA 1.2 CAL) 20 mL/hr at 07/10/17 0300  . heparin 1,050 Units/hr (07/10/17 0300)  . piperacillin-tazobactam (ZOSYN)  IV 3.375 g (07/10/17 1404)  . vancomycin      Assessment: Pharmacy consulted for heparin drip management for 84 yo female on heparin drip for new onset atrial fibrillation. Patient with CHADS2VASc of 5.  -Goal of Therapy:  Heparin level 0.3-0.7 units/ml Monitor platelets by anticoagulation protocol: Yes   Plan:  4/4 AM heparin level 0.32. Continue current regimen. Recheck in 8 hours to confirm.  4/4 1519 heparin level 0.32. Continue current regimen. Will recheck next  heparin level tomorrow with AM labs. Of note, hemoglobin is declining and will continue to monitor.    Cleopatra Cedar, PharmD Pharmacy Resident  07/10/17 3:48 PM

## 2017-07-11 ENCOUNTER — Inpatient Hospital Stay: Payer: Medicare Other

## 2017-07-11 DIAGNOSIS — G9341 Metabolic encephalopathy: Secondary | ICD-10-CM

## 2017-07-11 DIAGNOSIS — J9621 Acute and chronic respiratory failure with hypoxia: Secondary | ICD-10-CM

## 2017-07-11 LAB — CALCIUM, IONIZED: Calcium, Ionized, Serum: 4.3 mg/dL — ABNORMAL LOW (ref 4.5–5.6)

## 2017-07-11 LAB — CBC WITH DIFFERENTIAL/PLATELET
BASOS ABS: 0 10*3/uL (ref 0–0.1)
Basophils Relative: 0 %
EOS PCT: 0 %
Eosinophils Absolute: 0 10*3/uL (ref 0–0.7)
HCT: 26.9 % — ABNORMAL LOW (ref 35.0–47.0)
Hemoglobin: 8.7 g/dL — ABNORMAL LOW (ref 12.0–16.0)
Lymphocytes Relative: 4 %
Lymphs Abs: 1 10*3/uL (ref 1.0–3.6)
MCH: 27.2 pg (ref 26.0–34.0)
MCHC: 32.5 g/dL (ref 32.0–36.0)
MCV: 83.8 fL (ref 80.0–100.0)
MONO ABS: 0.8 10*3/uL (ref 0.2–0.9)
Monocytes Relative: 3 %
NEUTROS ABS: 23.4 10*3/uL — AB (ref 1.4–6.5)
Neutrophils Relative %: 93 %
PLATELETS: 230 10*3/uL (ref 150–440)
RBC: 3.21 MIL/uL — AB (ref 3.80–5.20)
RDW: 15.9 % — AB (ref 11.5–14.5)
WBC: 25.2 10*3/uL — AB (ref 3.6–11.0)

## 2017-07-11 LAB — PHOSPHORUS: PHOSPHORUS: 4.1 mg/dL (ref 2.5–4.6)

## 2017-07-11 LAB — PROCALCITONIN: Procalcitonin: 0.26 ng/mL

## 2017-07-11 LAB — BASIC METABOLIC PANEL
ANION GAP: 12 (ref 5–15)
BUN: 16 mg/dL (ref 6–20)
CALCIUM: 7.8 mg/dL — AB (ref 8.9–10.3)
CO2: 35 mmol/L — ABNORMAL HIGH (ref 22–32)
Chloride: 88 mmol/L — ABNORMAL LOW (ref 101–111)
Creatinine, Ser: 0.71 mg/dL (ref 0.44–1.00)
Glucose, Bld: 199 mg/dL — ABNORMAL HIGH (ref 65–99)
Potassium: 2.9 mmol/L — ABNORMAL LOW (ref 3.5–5.1)
SODIUM: 135 mmol/L (ref 135–145)

## 2017-07-11 LAB — HEPARIN LEVEL (UNFRACTIONATED)
HEPARIN UNFRACTIONATED: 0.35 [IU]/mL (ref 0.30–0.70)
HEPARIN UNFRACTIONATED: 0.3 [IU]/mL (ref 0.30–0.70)
Heparin Unfractionated: 0.27 IU/mL — ABNORMAL LOW (ref 0.30–0.70)

## 2017-07-11 LAB — GLUCOSE, CAPILLARY
GLUCOSE-CAPILLARY: 133 mg/dL — AB (ref 65–99)
GLUCOSE-CAPILLARY: 135 mg/dL — AB (ref 65–99)
GLUCOSE-CAPILLARY: 154 mg/dL — AB (ref 65–99)
Glucose-Capillary: 130 mg/dL — ABNORMAL HIGH (ref 65–99)
Glucose-Capillary: 167 mg/dL — ABNORMAL HIGH (ref 65–99)

## 2017-07-11 LAB — MAGNESIUM: Magnesium: 1.9 mg/dL (ref 1.7–2.4)

## 2017-07-11 MED ORDER — PANTOPRAZOLE SODIUM 40 MG PO PACK
40.0000 mg | PACK | Freq: Every day | ORAL | Status: DC
  Administered 2017-07-11: 40 mg
  Filled 2017-07-11: qty 20

## 2017-07-11 MED ORDER — POTASSIUM CHLORIDE 20 MEQ PO PACK
40.0000 meq | PACK | ORAL | Status: AC
  Administered 2017-07-11 (×2): 40 meq via ORAL
  Filled 2017-07-11 (×2): qty 2

## 2017-07-11 MED ORDER — MAGNESIUM OXIDE 400 (241.3 MG) MG PO TABS
400.0000 mg | ORAL_TABLET | Freq: Two times a day (BID) | ORAL | Status: DC
  Administered 2017-07-11 – 2017-07-12 (×2): 400 mg via ORAL
  Filled 2017-07-11 (×2): qty 1

## 2017-07-11 MED ORDER — MAGNESIUM OXIDE 400 (241.3 MG) MG PO TABS
400.0000 mg | ORAL_TABLET | Freq: Two times a day (BID) | ORAL | Status: DC
  Administered 2017-07-11: 400 mg via ORAL
  Filled 2017-07-11: qty 1

## 2017-07-11 MED ORDER — POTASSIUM CHLORIDE 20 MEQ PO PACK
40.0000 meq | PACK | ORAL | Status: DC
  Administered 2017-07-11: 40 meq via ORAL
  Filled 2017-07-11: qty 2

## 2017-07-11 MED ORDER — HEPARIN BOLUS VIA INFUSION
700.0000 [IU] | Freq: Once | INTRAVENOUS | Status: AC
  Administered 2017-07-11: 700 [IU] via INTRAVENOUS
  Filled 2017-07-11: qty 700

## 2017-07-11 NOTE — Progress Notes (Signed)
Name: Crystal Todd MRN: [id number] DOB: 09-Mar-1928     CONSULTATION DATE: 07/02/2017  Continues to be on BiPAP, on Heparin and Amiodarone. Patient with worsening respiratory status  PAST MEDICAL HISTORY :   has a past medical history of Arthritis, Asthma, COPD (chronic obstructive pulmonary disease) (HCC), Dysrhythmia, Full dentures, GERD (gastroesophageal reflux disease), Hypercholesteremia, Hypertension, Osteoporosis, Paroxysmal atrial fibrillation (HCC), and Shortness of breath dyspnea.  has a past surgical history that includes Back surgery; Esophagogastroduodenoscopy (egd) with esophageal dilation; Joint replacement; Wrist fracture surgery (Bilateral); and Cataract extraction w/PHACO (Left, 10/12/2014).    Prior to Admission medications   Medication Sig Start Date End Date Taking? Authorizing Provider  acetaminophen (TYLENOL) 325 MG tablet Take 650 mg by mouth every 4 (four) hours as needed for fever (up to 101).   Yes [provider]  albuterol (PROVENTIL HFA;VENTOLIN HFA) 108 (90 Base) MCG/ACT inhaler Inhale 2 puffs into the lungs every 6 (six) hours as needed for wheezing or shortness of breath. 07/02/17  Yes Willy Eddy, MD  alum & mag hydroxide-simeth (ANTACID) 200-200-20 MG/5ML suspension Take 30 mLs by mouth every 6 (six) hours as needed for indigestion or heartburn.   Yes [provider]  amLODipine (NORVASC) 5 MG tablet Take 5 mg by mouth daily.   Yes [provider]  cholecalciferol (VITAMIN D) 1000 units tablet Take 1,000 Units by mouth daily.   Yes [provider]  diphenhydrAMINE (BENADRYL) 25 MG tablet Take 25 mg by mouth every 6 (six) hours as needed for allergies.   Yes [provider]  divalproex (DEPAKOTE SPRINKLE) 125 MG capsule Take 125-250 mg by mouth 2 (two) times daily. 125 mg every morning and 250 mg at bedtime   Yes [provider]  fluticasone (FLONASE) 50 MCG/ACT nasal spray Place 2 sprays into the  nose 2 (two) times daily.   Yes [provider]  guaiFENesin (ROBITUSSIN) 100 MG/5ML liquid Take 300 mg by mouth every 6 (six) hours as needed for cough.   Yes [provider]  ipratropium-albuterol (DUONEB) 0.5-2.5 (3) MG/3ML SOLN Take 3 mLs by nebulization every 6 (six) hours as needed (SOB or wheezing). Patient taking differently: Take 3 mLs by nebulization every 6 (six) hours as needed. For wheezing or shortness of breath 06/30/17  Yes Sudini, Wardell Heath, MD  loperamide (IMODIUM A-D) 2 MG capsule Take 4 mg by mouth as needed for diarrhea or loose stools.   Yes [provider]  magnesium hydroxide (MILK OF MAGNESIA) 400 MG/5ML suspension Take 30 mLs by mouth daily as needed for mild constipation.   Yes [provider]  metoprolol tartrate (LOPRESSOR) 25 MG tablet Take 12.5 mg by mouth 2 (two) times daily.   Yes [provider]  multivitamin (PROSIGHT) TABS tablet Take 1 tablet by mouth daily.   Yes [provider]  omeprazole (PRILOSEC) 20 MG capsule Take 20 mg by mouth daily.   Yes [provider]  oseltamivir (TAMIFLU) 30 MG capsule Take 1 capsule (30 mg total) by mouth 2 (two) times daily. 06/30/17  Yes Sudini, Wardell Heath, MD  PARoxetine (PAXIL) 20 MG tablet Take 20 mg by mouth daily.   Yes [provider]  predniSONE (DELTASONE) 10 MG tablet Take 10 mg by mouth 4 (four) times daily. For 2 days, then 3 times daily for 2 days, then 2 times daily for 2 days, then daily for 2 days, then stop 07/01/17  Yes [provider]  vitamin B-12 (CYANOCOBALAMIN) 1000 MCG tablet Take  1,000 mcg by mouth daily.   Yes [provider]   Allergies  Allergen Reactions  . Ace Inhibitors     Other reaction(s): Other (See Comments) Other Reaction: Other reaction  . Alendronate     Other reaction(s): Other (See Comments) Other Reaction: Other reaction  . Eggs Or Egg-Derived Products Other (See Comments)    Unknown, pt just said that  she can't eat eggs due to not being able to take flu vaccine  . Influenza Vaccines Other (See Comments)    "kept the flu for a year"    FAMILY HISTORY:  family history is not on file. SOCIAL HISTORY:  reports that she quit smoking about 43 years ago. She has never used smokeless tobacco. She reports that she does not drink alcohol.  REVIEW OF SYSTEMS:   Unable to obtain due to critical illness   VITAL SIGNS: Temp:  [97.2 F (36.2 C)-98.6 F (37 C)] 97.5 F (36.4 C) (04/05 0200) Pulse Rate:  [61-118] 69 (04/05 0600) Resp:  [21-37] 22 (04/05 0600) BP: (92-183)/(45-73) 106/45 (04/05 0600) SpO2:  [86 %-99 %] 91 % (04/05 0600) FiO2 (%):  [100 %] 100 % (04/04 1946)  Physical Examination:  Patient presently on BiPAP with decreased level of responsiveness. HEENT: On BiPAP, increaseaccessory muscle utilization noted Cardiovascular: Tachycardia Pulmonary: Coarse rhd rales right greater than left Abdomen: Positive bowel sounds, soft exam Extremities: No clubbing, cyanosis or edema appreciated Neurologic: Encephalopathic  ASSESSMENT / PLAN: Acute hypoxic respiratory failure on chronic respiratory failure baseline home O2 2 L/min (improved) tolerating BiPAP 12/6/100%. Patients chest x-ray shows worsening airspace disease right greater than left with increasing FiO2 requirements. Most likely secondary to pneumonia and aspiration Presently on Zosyn. Patient is presently a DO NOT RESUSCITATE. Will discuss with family when they arrive to consider comfort care measures.  Leukocytosis. White count is 25.2  Altered mental status with hypoxia / meds./ toxic metabolic encephalopathy.  Flu.Completed Tamiflu for a total of 10 days of therapy.  A. fib with RVROn amiodarone drip  COPD exacerbation -Bronchodilators and taper steroids.  Mild elevation of troponin due to demand versus supply mismatch  Will discuss with niece today goals of care, palliative care and comfort  Critical  care time 35 min  Tora Kindred, DO

## 2017-07-11 NOTE — Progress Notes (Signed)
eLink Physician-Brief Progress Note Patient Name: Crystal Todd DOB: [birth year] MRN: [id number]   Date of Service  07/11/2017  HPI/Events of Note  Low saturation and somnolence on current Bipap settings which responded to increasing IPAP from 12 to 14 with increased wakefulness and saturation in the 92 % range.  eICU Interventions  ABG in one hour        Okoron[place]wo U Ogan 07/11/2017, 6:06 AM

## 2017-07-11 NOTE — Progress Notes (Signed)
Pharmacy Antibiotic Note  Crystal Todd is a 84 y.o. female admitted on 06/09/2017 with pneumonia.  Pharmacy has been consulted for Zosyn/Vancomycin dosing.  Plan: Vancomycin discontinued during AM ICU rounds.   Will continue Zosyn 3.375g IV Q8hr.   Height: 5\' 3"  (160 cm) Weight: 106 lb 11.2 oz (48.4 kg) IBW/kg (Calculated) : 52.4  Temp (24hrs), Avg:97.8 F (36.6 C), Min:97.2 F (36.2 C), Max:98.6 F (37 C)  Recent Labs  Lab 07/07/17 0509 07/08/17 0530 07/09/17 0047 07/10/17 0627 07/11/17 0537  WBC 25.6* 14.6* 21.1* 25.2* 25.2*  CREATININE 0.80 0.84 0.81 0.69 0.71    Estimated Creatinine Clearance: 37.9 mL/min (by C-G formula based on SCr of 0.71 mg/dL).    Allergies  Allergen Reactions  . Ace Inhibitors     Other reaction(s): Other (See Comments) Other Reaction: Other reaction  . Alendronate     Other reaction(s): Other (See Comments) Other Reaction: Other reaction  . Eggs Or Egg-Derived Products Other (See Comments)    Unknown, pt just said that she can't eat eggs due to not being able to take flu vaccine  . Influenza Vaccines Other (See Comments)    "kept the flu for a year"    Antimicrobials this admission: Zosyn 3/31 >> Vancomycin 4/4  >> 4/5  Dose adjustments this admission:   Microbiology results: 3/28 BCx: NG x5d 3/28 MRSA PCR: (-)  Pharmacy will continue to monitor and adjust per consult.   Simpson,Michael L 07/11/2017 12:12 PM

## 2017-07-11 NOTE — Progress Notes (Signed)
Progress Note  Patient Name: Crystal Todd Date of Encounter: 07/11/2017  Primary Cardiologist: Lorine Bears, MD  Subjective   Patient remains on BiPAP.   She converted to sinus rhythm overnight with IV amiodarone.  She continues to be lethargic.  Chest x-ray looks worse than yesterday and suggestive of possible ARDS.  Inpatient Medications    Scheduled Meds: . budesonide (PULMICORT) nebulizer solution  0.25 mg Nebulization BID  . furosemide  20 mg Intravenous Q8H  . insulin aspart  0-9 Units Subcutaneous TID WC  . levalbuterol  0.63 mg Nebulization Q6H  . mouth rinse  15 mL Mouth Rinse q12n4p  . metoprolol tartrate  25 mg Per NG tube BID  . pantoprazole (PROTONIX) IV  40 mg Intravenous QHS  . PARoxetine  20 mg Oral Daily  . Valproate Sodium  125 mg Per Tube Q8H   Continuous Infusions: . amiodarone 30 mg/hr (07/11/17 0600)  . dextrose 25 mL/hr at 07/11/17 0600  . feeding supplement (GLUCERNA 1.2 CAL) 20 mL/hr at 07/11/17 0600  . heparin 1,150 Units/hr (07/11/17 0653)  . piperacillin-tazobactam (ZOSYN)  IV 3.375 g (07/11/17 0545)  . vancomycin Stopped (07/10/17 2215)   PRN Meds: acetaminophen **OR** acetaminophen, alum & mag hydroxide-simeth, bisacodyl, guaiFENesin, levalbuterol, magnesium hydroxide, morphine injection, ondansetron **OR** ondansetron (ZOFRAN) IV, senna-docusate   Vital Signs    Vitals:   07/11/17 0300 07/11/17 0400 07/11/17 0500 07/11/17 0600  BP: (!) 106/52 (!) 129/57 (!) 183/73 (!) 106/45  Pulse: 61 68 88 69  Resp: (!) 22 (!) 30 (!) 37 (!) 22  Temp:      TempSrc:      SpO2: 94% 92% (!) 86% 91%  Weight:      Height:        Intake/Output Summary (Last 24 hours) at 07/11/2017 0833 Last data filed at 07/11/2017 0600 Gross per 24 hour  Intake 1922.99 ml  Output 2200 ml  Net -277.01 ml   Filed Weights   06/24/2017 2310  Weight: 106 lb 11.2 oz (48.4 kg)    Physical Exam   GEN: Frail.  On BiPAP. Neck: Supple, no JVD, carotid bruits, or  masses. Cardiac: Regular rate and rhythm, no murmurs, rubs, or gallops. No clubbing, cyanosis, edema.  Radials/DP/PT 2+ and equal bilaterally.  Respiratory: She is tachypneic with mild respiratory distress on BiPAP, coarse breath sounds bilaterally. GI: Soft, nontender, nondistended, BS + x 4. MS: no deformity or atrophy. Skin: warm and dry, no rash. Neuro:  Strength and sensation are intact. Psych: Flat affect.  Lethargic.  Labs    Chemistry Recent Labs  Lab 07/06/17 0150  07/09/17 0047 07/10/17 0627 07/11/17 0537  NA 137   < > 135 136 135  K 4.6   < > 3.2* 3.8 2.9*  CL 106   < > 94* 95* 88*  CO2 21*   < > 31 32 35*  GLUCOSE 207*   < > 159* 175* 199*  BUN 23*   < > 13 14 16   CREATININE 0.75   < > 0.81 0.69 0.71  CALCIUM 7.8*   < > 7.9* 7.9* 7.8*  PROT 5.9*  --   --   --   --   ALBUMIN 2.6*  --   --   --   --   AST 39  --   --   --   --   ALT 52  --   --   --   --   ALKPHOS 66  --   --   --   --  BILITOT 0.4  --   --   --   --   GFRNONAA >60   < > >60 >60 >60  GFRAA >60   < > >60 >60 >60  ANIONGAP 10   < > 10 9 12    < > = values in this interval not displayed.     Hematology Recent Labs  Lab 07/09/17 0047 07/10/17 0627 07/11/17 0537  WBC 21.1* 25.2* 25.2*  RBC 3.52* 3.33* 3.21*  HGB 9.6* 9.1* 8.7*  HCT 29.7* 28.3* 26.9*  MCV 84.4 84.9 83.8  MCH 27.2 27.3 27.2  MCHC 32.2 32.1 32.5  RDW 16.0* 15.9* 15.9*  PLT 195 229 230    Cardiac Enzymes Recent Labs  Lab 07/05/17 0751 07/05/17 1404 07/05/17 1917  TROPONINI <0.03 <0.03 <0.03      Radiology    Dg Chest Port 1 View  Result Date: 07/11/2017 CLINICAL DATA:  CHF EXAM: PORTABLE CHEST 1 VIEW COMPARISON:  07/10/2017, 07/09/2017, 07/08/2017 FINDINGS: Increased bilateral pleural effusions. Stable enlarged cardiomediastinal silhouette with vascular congestion and asymmetric right greater than left airspace disease. Persistent consolidation at the left base. Aortic atherosclerosis. IMPRESSION: 1. Increasing  pleural effusions. 2. Cardiomegaly with vascular congestion and continued extensive right greater than left airspace disease Electronically Signed   By: 09/07/2017 M.D.   On: 07/11/2017 03:41   Dg Chest Port 1 View  Result Date: 07/10/2017 CLINICAL DATA:  Pneumonia EXAM: PORTABLE CHEST 1 VIEW COMPARISON:  07/09/2017 FINDINGS: Progression of severe bilateral airspace disease right greater than left. Left lower lobe consolidation compatible with atelectasis unchanged. No significant effusion. Feeding tube enters the stomach with the tip not visualized. IMPRESSION: Severe bilateral airspace disease, with progression Electronically Signed   By: 09/08/2017 M.D.   On: 07/10/2017 07:18    Telemetry    A. fib with RVR with rates extending up to the 140s.- Personally Reviewed  Cardiac Studies   TTE (07/06/17): - Left ventricle: The cavity size was normal. Wall thickness was normal. Systolic function was vigorous. The estimated ejection fraction was in the range of 65% to 70%. Left ventricular diastolic function parameters were normal. - Mitral valve: There was moderate regurgitation. - Left atrium: The atrium was moderately dilated. - Right atrium: The atrium was mildly dilated. - Tricuspid valve: There was moderate regurgitation. - Pulmonary arteries: PA peak pressure: 50 mm Hg (S).  Patient Profile     84 y.o.femalewith history of HTN, HLD, OA, and asthma who was admitted to [facility] with influenza PNA and noted to be in Afib with RVR.  Assessment & Plan    1.  Atrial fibrillation: In the setting of influenza and right upper lobe pneumonia.   She converted to sinus rhythm with IV amiodarone which should be continued for now given that she is at high risk for recurrent arrhythmia considering her respiratory status.  She is currently on anticoagulation with unfractionated heparin with plans to switch to Eliquis 2.5 mg twice daily if her condition improves.  2.  Acute respiratory  failure/influenza: Multifactorial in the setting of right upper lobe pneumonia, influenza, diastolic failure, and rapid A. fib.  She has been requiring BiPAP for greater than 48 hrs . chest x-ray has worsened and suggestive of diffuse ARDS.  I cannot exclude a component of heart failure and she is getting furosemide every 8 hours.  3.  Acute on chronic diastolic congestive heart failure/pulmonary arterial hypertension: EF 65-70% by echo this admission.  Continue current dose of furosemide.  4.  Anemia: Stable.  Overall prognosis appears to be poor given progression of lung disease and persistent respiratory failure.  Palliative care team is involved.  Signed, Lorine Bears, MD  07/11/2017, 8:33 AM    For questions or updates, please contact   Please consult [email or web address] for contact info under Cardiology/STEMI.

## 2017-07-11 NOTE — Progress Notes (Signed)
Pharmacy Electrolyte Monitoring Consult:  Pharmacy consulted to assist in monitoring and replacing electrolytes in this 84 y.o. female admitted on 06/16/2017 with Shortness of Breath  Patient currently receiving furosemide 20mg  IV Q8hr. Patient will need to received oral supplementation.   Labs:  Sodium (mmol/L)  Date Value  07/11/2017 135  06/18/2011 142   Potassium (mmol/L)  Date Value  07/11/2017 2.9 (L)  06/18/2011 4.2   Magnesium (mg/dL)  Date Value  08/18/2011 1.9   Phosphorus (mg/dL)  Date Value  92/33/0076 4.1   Calcium (mg/dL)  Date Value  22/63/3354 7.8 (L)   Calcium, Total (mg/dL)  Date Value  56/25/6389 9.2   Albumin (g/dL)  Date Value  37/34/2876 2.6 (L)    Plan:  Will replace potassium 81/15/7262 PO Q4hr x 3 doses. Will order magnesium 400mg  PO BID. Will recheck electrolytes with am labs.   Goal potassium ~ 4 and goal magnesium ~2 per AM rounds.   Pharmacy will continue to monitor and adjust per consult.   Simpson,Michael L 07/11/2017 12:06 PM

## 2017-07-11 NOTE — Progress Notes (Addendum)
ANTICOAGULATION CONSULT NOTE   Pharmacy Consult for heparin drip Indication: atrial fibrillation  Allergies  Allergen Reactions  . Ace Inhibitors     Other reaction(s): Other (See Comments) Other Reaction: Other reaction  . Alendronate     Other reaction(s): Other (See Comments) Other Reaction: Other reaction  . Eggs Or Egg-Derived Products Other (See Comments)    Unknown, pt just said that she can't eat eggs due to not being able to take flu vaccine  . Influenza Vaccines Other (See Comments)    "kept the flu for a year"    Patient Measurements: Height: 5\' 3"  (160 cm) Weight: 106 lb 11.2 oz (48.4 kg) IBW/kg (Calculated) : 52.4 Heparin Dosing Weight: 48.4kg  Vital Signs: Temp: 98.6 F (37 C) (04/05 0907) Temp Source: Axillary (04/05 0907) BP: 135/55 (04/05 1502) Pulse Rate: 62 (04/05 1502)  Labs: Recent Labs    07/09/17 0047  07/10/17 0627  07/10/17 1519 07/11/17 0537 07/11/17 1447  HGB 9.6*  --  9.1*  --   --  8.7*  --   HCT 29.7*  --  28.3*  --   --  26.9*  --   PLT 195  --  229  --   --  230  --   HEPARINUNFRC 0.41   < >  --    < > 0.32 0.27* 0.35  CREATININE 0.81  --  0.69  --   --  0.71  --    < > = values in this interval not displayed.    Estimated Creatinine Clearance: 37.9 mL/min (by C-G formula based on SCr of 0.71 mg/dL).   Medical History: Past Medical History:  Diagnosis Date  . Arthritis    hands, knees  . Asthma   . COPD (chronic obstructive pulmonary disease) (HCC)   . Dysrhythmia    A-Fib  . Full dentures    upper and lower  . GERD (gastroesophageal reflux disease)   . Hypercholesteremia   . Hypertension   . Osteoporosis   . Paroxysmal atrial fibrillation (HCC)   . Shortness of breath dyspnea     Medications:  Scheduled:  . budesonide (PULMICORT) nebulizer solution  0.25 mg Nebulization BID  . furosemide  20 mg Intravenous Q8H  . insulin aspart  0-9 Units Subcutaneous TID WC  . levalbuterol  0.63 mg Nebulization Q6H  .  magnesium oxide  400 mg Oral BID  . mouth rinse  15 mL Mouth Rinse q12n4p  . metoprolol tartrate  25 mg Per NG tube BID  . pantoprazole sodium  40 mg Per Tube q1800  . PARoxetine  20 mg Oral Daily  . potassium chloride  40 mEq Oral Q4H  . Valproate Sodium  125 mg Per Tube Q8H   Infusions:  . amiodarone 30 mg/hr (07/11/17 0600)  . dextrose 25 mL/hr at 07/11/17 0600  . feeding supplement (GLUCERNA 1.2 CAL) 1,000 mL (07/11/17 1614)  . heparin 1,150 Units/hr (07/11/17 0653)  . piperacillin-tazobactam (ZOSYN)  IV 3.375 g (07/11/17 1356)    Assessment: Pharmacy consulted for heparin drip management for 84 yo female on heparin drip for new onset atrial fibrillation. Patient with CHADS2VASc of 5. Patient currently receiving heparin 1150 units/hr.    -Goal of Therapy:  Heparin level 0.3-0.7 units/ml Monitor platelets by anticoagulation protocol: Yes   Plan:  Anti-Xa level is currently in range. Will obtain confirmatory level at  2230. Will continue heparin at 1150 units/hr.   Pharmacy will continue to monitor and adjust per consult.  Simpson,Michael L 07/11/17 4:27 PM   4/5 PM heparin level 0.30. Continue current regimen. Recheck heparin level and CBC with tomorrow AM labs.  Fulton Reek, PharmD, BCPS  07/11/17 11:35 PM

## 2017-07-11 NOTE — Progress Notes (Signed)
PHARMACIST - PHYSICIAN COMMUNICATION  CONCERNING: IV to Oral Route Change Policy  RECOMMENDATION: This patient is receiving pantopraozle by the intravenous route.  Based on criteria approved by the Pharmacy and Therapeutics Committee, the intravenous medication(s) is/are being converted to the equivalent oral dose form(s).   DESCRIPTION: These criteria include:  The patient is eating (either orally or via tube) and/or has been taking other orally administered medications for a least 24 hours  The patient has no evidence of active gastrointestinal bleeding or impaired GI absorption (gastrectomy, short bowel, patient on TNA or NPO).  If you have questions about this conversion, please contact the Pharmacy Department  []   [phone] )  ( [phone] [x]   [phone] )  [facility] []   [phone] )  [facility] []   [phone] )  [facility] []   [phone] )  [facility]   Meily Glowacki L, [facility] 07/11/2017 9:38 AM

## 2017-07-11 NOTE — Progress Notes (Signed)
SLP Cancellation Note  Patient Details Name: Crystal Todd MRN: [id number] DOB: 24-Oct-1927   Cancelled treatment:       Reason Eval/Treat Not Completed: Medical issues which prohibited therapy;Patient not medically ready(reviewed chart notes; consulted NSG/MD re: status). Chart notes indicate discussion w/ family re: goals of care d/t pt's declined Pulmonary status; pt remains on BiPAP at this time. Recommend oral care when able for hygiene and stimulation of swallowing. ST services will f/u next week when pt is able to be weaned from BiPAP and safely participate in po intake. MD agreed.    Jerilynn Som, MS, CCC-SLP Crystal Todd 07/11/2017, 1:57 PM

## 2017-07-11 NOTE — Progress Notes (Signed)
ANTICOAGULATION CONSULT NOTE   Pharmacy Consult for heparin drip Indication: atrial fibrillation  Allergies  Allergen Reactions  . Ace Inhibitors     Other reaction(s): Other (See Comments) Other Reaction: Other reaction  . Alendronate     Other reaction(s): Other (See Comments) Other Reaction: Other reaction  . Eggs Or Egg-Derived Products Other (See Comments)    Unknown, pt just said that she can't eat eggs due to not being able to take flu vaccine  . Influenza Vaccines Other (See Comments)    "kept the flu for a year"    Patient Measurements: Height: 5\' 3"  (160 cm) Weight: 106 lb 11.2 oz (48.4 kg) IBW/kg (Calculated) : 52.4 Heparin Dosing Weight: 48.4kg  Vital Signs: Temp: 97.5 F (36.4 C) (04/05 0200) Temp Source: Axillary (04/05 0200) BP: 106/52 (04/05 0300) Pulse Rate: 61 (04/05 0300)  Labs: Recent Labs    07/09/17 0047  07/10/17 0627 07/10/17 0628 07/10/17 1519 07/11/17 0537  HGB 9.6*  --  9.1*  --   --   --   HCT 29.7*  --  28.3*  --   --   --   PLT 195  --  229  --   --   --   HEPARINUNFRC 0.41   < >  --  0.32 0.32 0.27*  CREATININE 0.81  --  0.69  --   --   --    < > = values in this interval not displayed.    Estimated Creatinine Clearance: 37.9 mL/min (by C-G formula based on SCr of 0.69 mg/dL).   Medical History: Past Medical History:  Diagnosis Date  . Arthritis    hands, knees  . Asthma   . COPD (chronic obstructive pulmonary disease) (HCC)   . Dysrhythmia    A-Fib  . Full dentures    upper and lower  . GERD (gastroesophageal reflux disease)   . Hypercholesteremia   . Hypertension   . Osteoporosis   . Paroxysmal atrial fibrillation (HCC)   . Shortness of breath dyspnea     Medications:  Scheduled:  . budesonide (PULMICORT) nebulizer solution  0.25 mg Nebulization BID  . furosemide  20 mg Intravenous Q8H  . heparin  700 Units Intravenous Once  . insulin aspart  0-9 Units Subcutaneous TID WC  . levalbuterol  0.63 mg Nebulization  Q6H  . mouth rinse  15 mL Mouth Rinse q12n4p  . metoprolol tartrate  25 mg Per NG tube BID  . pantoprazole (PROTONIX) IV  40 mg Intravenous QHS  . PARoxetine  20 mg Oral Daily  . Valproate Sodium  125 mg Per Tube Q8H   Infusions:  . amiodarone 30 mg/hr (07/10/17 2349)  . dextrose 25 mL/hr at 07/10/17 1700  . feeding supplement (GLUCERNA 1.2 CAL) 20 mL/hr at 07/10/17 1700  . heparin 1,050 Units/hr (07/11/17 0049)  . piperacillin-tazobactam (ZOSYN)  IV 3.375 g (07/11/17 0545)  . vancomycin Stopped (07/10/17 2215)    Assessment: Pharmacy consulted for heparin drip management for 84 yo female on heparin drip for new onset atrial fibrillation. Patient with CHADS2VASc of 5.  -Goal of Therapy:  Heparin level 0.3-0.7 units/ml Monitor platelets by anticoagulation protocol: Yes   Plan:  4/4 AM heparin level 0.32. Continue current regimen. Recheck in 8 hours to confirm.  4/4 1519 heparin level 0.32. Continue current regimen. Will recheck next heparin level tomorrow with AM labs. Of note, hemoglobin is declining and will continue to monitor.   4/5 AM heparin level 0.27. 700  units bolus and increase rate to 1150 units/hr. Recheck in 8 hours.  Erich Montane, PharmD Pharmacy Resident  07/11/17 6:10 AM

## 2017-07-11 NOTE — Progress Notes (Signed)
Early at [place] NAME: Crystal Todd    MR#:  [id number]  DATE OF BIRTH:  05/17/1927  SUBJECTIVE:   Patient remains on BiPAP.  Patient converted from atrial fibrillation to normal sinus rhythm.  Heart rates are better controlled on amiodarone REVIEW OF SYSTEMS:    Unable to obtain patient on Bipap  Confused  Tolerating Diet:tube feeds   DRUG ALLERGIES:   Allergies  Allergen Reactions  . Ace Inhibitors     Other reaction(s): Other (See Comments) Other Reaction: Other reaction  . Alendronate     Other reaction(s): Other (See Comments) Other Reaction: Other reaction  . Eggs Or Egg-Derived Products Other (See Comments)    Unknown, pt just said that she can't eat eggs due to not being able to take flu vaccine  . Influenza Vaccines Other (See Comments)    "kept the flu for a year"    VITALS:  Blood pressure (!) 106/45, pulse 69, temperature (!) 97.5 F (36.4 C), temperature source Axillary, resp. rate (!) 22, height _0  (1.6 m), weight 48.4 kg (106 lb 11.2 oz), SpO2 91 %.  PHYSICAL EXAMINATION:  Constitutional: Appears thin and frail his respiratory effort is moderate distress hENT: Normocephalic. . On BIpap Eyes: Conjunctivae and EOM are normal. PERRLA, no scleral icterus.  Neck:  Neck supple. No JVD. No tracheal deviation. CVS: Regular rate and rhythm, no murmurs, no gallops, no carotid bruit.  Pulmonary: b/l coarse rhonchi no wheezing  Abdominal: Soft. BS +,  no distension, tenderness, rebound or guarding.  Musculoskeletal: . No edema and no tenderness.  Neuro: on bipap Skin: Skin is warm and dry. No rash noted. Psychiatric: Opens eyes and follows simple commands  LABORATORY PANEL:   CBC Recent Labs  Lab 07/11/17 0537  WBC 25.2*  HGB 8.7*  HCT 26.9*  PLT 230   ------------------------------------------------------------------------------------------------------------------  Chemistries  Recent Labs   Lab 07/06/17 0150  07/11/17 0537  NA 137   < > 135  K 4.6   < > 2.9*  CL 106   < > 88*  CO2 21*   < > 35*  GLUCOSE 207*   < > 199*  BUN 23*   < > 16  CREATININE 0.75   < > 0.71  CALCIUM 7.8*   < > 7.8*  MG 2.4   < > 1.9  AST 39  --   --   ALT 52  --   --   ALKPHOS 66  --   --   BILITOT 0.4  --   --    < > = values in this interval not displayed.   ------------------------------------------------------------------------------------------------------------------  Cardiac Enzymes Recent Labs  Lab 07/05/17 0751 07/05/17 1404 07/05/17 1917  TROPONINI <0.03 <0.03 <0.03   ------------------------------------------------------------------------------------------------------------------  RADIOLOGY:  Dg Chest Port 1 View  Result Date: 07/11/2017 CLINICAL DATA:  CHF EXAM: PORTABLE CHEST 1 VIEW COMPARISON:  07/10/2017, 07/09/2017, 07/08/2017 FINDINGS: Increased bilateral pleural effusions. Stable enlarged cardiomediastinal silhouette with vascular congestion and asymmetric right greater than left airspace disease. Persistent consolidation at the left base. Aortic atherosclerosis. IMPRESSION: 1. Increasing pleural effusions. 2. Cardiomegaly with vascular congestion and continued extensive right greater than left airspace disease Electronically Signed   By: Donavan Foil M.D.   On: 07/11/2017 03:41   Dg Chest Port 1 View  Result Date: 07/10/2017 CLINICAL DATA:  Pneumonia EXAM: PORTABLE CHEST 1 VIEW COMPARISON:  07/09/2017 FINDINGS: Progression of severe bilateral airspace disease right greater than  left. Left lower lobe consolidation compatible with atelectasis unchanged. No significant effusion. Feeding tube enters the stomach with the tip not visualized. IMPRESSION: Severe bilateral airspace disease, with progression Electronically Signed   By: Franchot Gallo M.D.   On: 07/10/2017 07:18     ASSESSMENT AND PLAN:   84 year old female with history of COPD, essential hypertension and PAF  with recent discharge from the hospital for influenza A and COPD who presented with shortness of breath.  1.  Septic shock with acute hypoxic respiratory failure: Patient presented with hypotension, tachycardia, leukocytosis and fever  Sepsis is due to pneumonia and influenza A There is concern for aspiration causing acute hypoxic respiratory failure. Continue Zosyn for aspiration pneumonia Vancomycin ordered patient with increased respiratory rate She has completed treatment with Tamiflu for influenza. ??ARDS??? NPO for high risk of aspiration as per Speech consult She is on tube feeds. Chest x-ray this morning shows bilateral progression of underlying pneumonia  2.  Pneumonia: Initially patient was treated for healthcare acquired pneumonia however it appears now that patient is aspirating.  Antibiotics changed from cefepime to Zosyn (day 6) day 2 of vancomycin  MRSA PCR negative   3.  Influenza A: She has completed treatment with Tamiflu. 4.  Septic shock: Blood pressure tolerable  5.  PAF with RVR Continue Amiodarone, metoprolol  and heparin gtt as per Cardiology Echo shows normal ejection fraction with moderate mitral regurgitation and elevated PA pressures Appreciate cardiology consultation TSH was wnml   6.  Acute COPD exacerbation due to pneumonia and influenza:  Patient given 1 dose of IV steroids this morning.   Continue levalbuterol   7.  Elevated troponin due to demand ischemia: Patient has been ruled out for ACS.  8. Elevated blood sugar due to steroids A1c is 5.8   9.  Acute on chronic diastolic heart failure with preserved ejection fraction:  Continue Loasix 10. Aspiration risk: Patient is NPO and high risk of aspiration. She is DNR. Palliative care consult appreciated.  11. Hypokalemia: This will be repleted.  12.  Elevated WBC: Continue to monitor  She with very poor overall prognosis.  She is DNR.  She has increased respiratory effort.  PC met with  family and if no improvement or SOB escalates then transition to comfort care    CODE STATUS: DNR  TOTAL TIME TAKING CARE OF THIS PATIENT: 24 minutes.    D/w dr Jefferson Fuel  POSSIBLE D/C?? DEPENDING ON CLINICAL CONDITION.   Crystal Todd M.D on 07/11/2017 at 8:03 AM  Between 7am to 6pm - Pager - [phone] After 6pm go to [email or web address] - password [email or web address] [facility]  Office  [phone]  CC: Primary care physician; Patient, No Pcp Per  Note: This dictation was prepared with Dragon dictation along with smaller phrase technology. Any transcriptional errors that result from this process are unintentional.

## 2017-07-12 LAB — HEPARIN LEVEL (UNFRACTIONATED): HEPARIN UNFRACTIONATED: 0.25 [IU]/mL — AB (ref 0.30–0.70)

## 2017-07-12 LAB — GLUCOSE, CAPILLARY
Glucose-Capillary: 126 mg/dL — ABNORMAL HIGH (ref 65–99)
Glucose-Capillary: 147 mg/dL — ABNORMAL HIGH (ref 65–99)
Glucose-Capillary: 147 mg/dL — ABNORMAL HIGH (ref 65–99)

## 2017-07-12 LAB — CBC
HCT: 25.4 % — ABNORMAL LOW (ref 35.0–47.0)
Hemoglobin: 8 g/dL — ABNORMAL LOW (ref 12.0–16.0)
MCH: 27 pg (ref 26.0–34.0)
MCHC: 31.6 g/dL — ABNORMAL LOW (ref 32.0–36.0)
MCV: 85.5 fL (ref 80.0–100.0)
PLATELETS: 222 10*3/uL (ref 150–440)
RBC: 2.98 MIL/uL — AB (ref 3.80–5.20)
RDW: 16.2 % — AB (ref 11.5–14.5)
WBC: 22 10*3/uL — ABNORMAL HIGH (ref 3.6–11.0)

## 2017-07-12 LAB — BASIC METABOLIC PANEL
ANION GAP: 10 (ref 5–15)
BUN: 23 mg/dL — ABNORMAL HIGH (ref 6–20)
CALCIUM: 8.3 mg/dL — AB (ref 8.9–10.3)
CO2: 39 mmol/L — ABNORMAL HIGH (ref 22–32)
CREATININE: 0.82 mg/dL (ref 0.44–1.00)
Chloride: 91 mmol/L — ABNORMAL LOW (ref 101–111)
GFR calc Af Amer: >60 mL/min (ref >60)
GLUCOSE: 141 mg/dL — AB (ref 65–99)
Potassium: 4.7 mmol/L (ref 3.5–5.1)
Sodium: 140 mmol/L (ref 135–145)

## 2017-07-12 LAB — PROCALCITONIN: PROCALCITONIN: 0.69 ng/mL

## 2017-07-12 LAB — MAGNESIUM: Magnesium: 2.1 mg/dL (ref 1.7–2.4)

## 2017-07-12 MED ORDER — FUROSEMIDE 10 MG/ML IJ SOLN
80.0000 mg | Freq: Two times a day (BID) | INTRAMUSCULAR | Status: DC
  Administered 2017-07-12: 80 mg via INTRAVENOUS
  Filled 2017-07-12: qty 8

## 2017-07-12 MED ORDER — HEPARIN BOLUS VIA INFUSION
700.0000 [IU] | Freq: Once | INTRAVENOUS | Status: AC
  Administered 2017-07-12: 700 [IU] via INTRAVENOUS
  Filled 2017-07-12: qty 700

## 2017-07-12 MED ORDER — MORPHINE 100MG IN NS 100ML (1MG/ML) PREMIX INFUSION
1.0000 mg/h | INTRAVENOUS | Status: DC
Start: 2017-07-12 — End: 2017-07-12
  Administered 2017-07-12: 1 mg/h via INTRAVENOUS
  Filled 2017-07-12: qty 100

## 2017-07-12 MED ORDER — LORAZEPAM 2 MG/ML IJ SOLN: 1.0000 mg | INTRAMUSCULAR | Status: DC | PRN

## 2017-07-15 ENCOUNTER — Telehealth: Payer: Self-pay

## 2017-07-15 NOTE — Telephone Encounter (Signed)
Death cert placed in DK's folder to be signed. 

## 2017-07-15 NOTE — Telephone Encounter (Signed)
Crystal Todd dropped off Death Certificate to be signed Placed in Nurse Box

## 2017-07-17 NOTE — Telephone Encounter (Signed)
Death cert filled out by DS. [facility] informed death cert ready for pick up.

## 2017-08-06 NOTE — Progress Notes (Addendum)
Progress Note  Patient Name: Crystal Todd Date of Encounter: 07/21/2017  Primary Cardiologist: Kirke Corin  Subjective   Remains critically ill, requiring BiPAP. Family in room. Remains in sinus rhythm on IV amiodarone gtt. On heparin gtt. HGB 8.7-->8.0. CXR on 4/5 suggestive of possible ARDS. Potassium repleted. Received IV Lasix 20 mg q 8 hours on 4/5, held this morning. Remains net + 7.2 L for the admission. Renal function stable.   Inpatient Medications    Scheduled Meds: . budesonide (PULMICORT) nebulizer solution  0.25 mg Nebulization BID  . insulin aspart  0-9 Units Subcutaneous TID WC  . levalbuterol  0.63 mg Nebulization Q6H  . magnesium oxide  400 mg Oral BID  . mouth rinse  15 mL Mouth Rinse q12n4p  . metoprolol tartrate  25 mg Per NG tube BID  . pantoprazole sodium  40 mg Per Tube q1800  . PARoxetine  20 mg Oral Daily  . Valproate Sodium  125 mg Per Tube Q8H   Continuous Infusions: . amiodarone 30 mg/hr (07/21/2017 0600)  . dextrose 25 mL/hr at 07/13/2017 0600  . feeding supplement (GLUCERNA 1.2 CAL) 50 mL/hr at 07/15/2017 0600  . heparin 1,250 Units/hr (07/08/2017 0727)  . piperacillin-tazobactam (ZOSYN)  IV 3.375 g (08/05/2017 0650)   PRN Meds: acetaminophen **OR** acetaminophen, alum & mag hydroxide-simeth, bisacodyl, guaiFENesin, levalbuterol, magnesium hydroxide, morphine injection, ondansetron **OR** ondansetron (ZOFRAN) IV, senna-docusate   Vital Signs    Vitals:   07/17/2017 0500 07/31/2017 0600 07/09/2017 0800 07/15/2017 0849  BP: (!) 149/57 (!) 160/55 (!) 156/63   Pulse: 62 61 60   Resp: (!) 28 (!) 23 (!) 31   Temp:    98.1 F (36.7 C)  TempSrc:    Axillary  SpO2: 98% 97% 99%   Weight:      Height:        Intake/Output Summary (Last 24 hours) at 07/15/2017 0956 Last data filed at 07/08/2017 0900 Gross per 24 hour  Intake 2630.07 ml  Output 1600 ml  Net 1030.07 ml   Filed Weights   07-20-17 2310 07/16/2017 0457  Weight: 106 lb 11.2 oz (48.4 kg) 117 lb 15.1 oz  (53.5 kg)    Telemetry    Sinus rhythm - Personally Reviewed  ECG    n/a - Personally Reviewed  Physical Exam   GEN: Critically ill appearing, elderly and frail.   Neck: JVD difficult to assess secondary to BiPAP. Cardiac: RRR, no murmurs, rubs, or gallops.  Respiratory: Tachypneic with mild respiratory distress and diminished breath sounds bilaterally. BiPAP in place.   GI: Soft, nontender, non-distended.   MS: No edema; No deformity. Neuro:  Lethargic.  Psych: Lethargic.  Labs    Chemistry Recent Labs  Lab 07/06/17 0150  07/10/17 0627 07/11/17 0537 07/19/2017 0542  NA 137   < > 136 135 140  K 4.6   < > 3.8 2.9* 4.7  CL 106   < > 95* 88* 91*  CO2 21*   < > 32 35* 39*  GLUCOSE 207*   < > 175* 199* 141*  BUN 23*   < > 14 16 23*  CREATININE 0.75   < > 0.69 0.71 0.82  CALCIUM 7.8*   < > 7.9* 7.8* 8.3*  PROT 5.9*  --   --   --   --   ALBUMIN 2.6*  --   --   --   --   AST 39  --   --   --   --  ALT 52  --   --   --   --   ALKPHOS 66  --   --   --   --   BILITOT 0.4  --   --   --   --   GFRNONAA >60   < > >60 >60 >60  GFRAA >60   < > >60 >60 >60  ANIONGAP 10   < > 9 12 10    < > = values in this interval not displayed.     Hematology Recent Labs  Lab 07/10/17 0627 07/11/17 0537 Jul 31, 2017 0542  WBC 25.2* 25.2* 22.0*  RBC 3.33* 3.21* 2.98*  HGB 9.1* 8.7* 8.0*  HCT 28.3* 26.9* 25.4*  MCV 84.9 83.8 85.5  MCH 27.3 27.2 27.0  MCHC 32.1 32.5 31.6*  RDW 15.9* 15.9* 16.2*  PLT 229 230 222    Cardiac Enzymes Recent Labs  Lab 07/05/17 1404 07/05/17 1917  TROPONINI <0.03 <0.03   No results for input(s): TROPIPOC in the last 168 hours.   BNPNo results for input(s): BNP, PROBNP in the last 168 hours.   DDimer No results for input(s): DDIMER in the last 168 hours.   Radiology    Dg Chest Port 1 View  Result Date: 07/11/2017 IMPRESSION: 1. Increasing pleural effusions. 2. Cardiomegaly with vascular congestion and continued extensive right greater than left  airspace disease Electronically Signed   By: 09/10/2017 M.D.   On: 07/11/2017 03:41    Cardiac Studies   TTE (07/06/17): - Left ventricle: The cavity size was normal. Wall thickness was normal. Systolic function was vigorous. The estimated ejection fraction was in the range of 65% to 70%. Left ventricular diastolic function parameters were normal. - Mitral valve: There was moderate regurgitation. - Left atrium: The atrium was moderately dilated. - Right atrium: The atrium was mildly dilated. - Tricuspid valve: There was moderate regurgitation. - Pulmonary arteries: PA peak pressure: 50 mm Hg (S).   Patient Profile     84 y.o. female with history of HTN, HLD, OA, and asthma who was admitted to [facility] with influenza PNA and noted to be in Afib with RVR.  Assessment & Plan    1. PAF with RVR: -In the setting of influenza and right upper lobe PNA -Has converted to sinus rhythm on IV amiodarone gtt, continue for now as she is at high risk for redeveloping arrhythmia -Remains on heparin gtt -Awaiting GOC discussion between family and PCCM -If there is meaningful recovery, would eventually transition to PO amiodarone and Eliquis 2.5 mg bid  2. Acute respiratory failure with hypoxia: -Multifactorial including right upper lobe PNA, influenza, rapid Afib, and diastolic CHF -Continues to require BiPAP -CXR on 4/5 worsened -IV Lasix held this AM, consider resuming -Per PCCM  3. Acute on chronic diastolic CHF/pulmonary HTN: -Echo with EF of 65-70% this admission -Consider resuming Lasix as above  4. Anemia: -Stable -Monitor on heparin gtt  5. Dispo: -Overall, poor prognosis -Palliative care involved   For questions or updates, please contact [facility] Please consult [email or web address] for contact info under Cardiology/STEMI.    Signed, [facility], PA-C [facility] Pager: [phone] 07-31-2017, 9:56 AM   Patient seen, examined. Available data reviewed. Agree  with findings, assessment, and plan as outlined by 09/11/2017, PA-C.  The patient is independently interviewed and examined.  She is an elderly woman in moderate respiratory distress on BiPAP.  Lung fields with diffuse rhonchi and crackles.  Heart is distant without murmur or gallop.  Extremities with mild edema.  X-ray shows diffuse airspace disease and cardiomegaly.  The patient remains critically ill with ARDS and heart failure.  Family is having discussion about comfort care.  Will increase furosemide dose to see if we can give her any relief from continued respiratory failure and pulmonary edema.  Plan discussed with the patient's family at bedside.  Ongoing discussions with critical care medicine regarding goals of care.  Tonny Bollman, M.D. 08/01/2017 1:02 PM

## 2017-08-06 NOTE — Progress Notes (Signed)
ANTICOAGULATION CONSULT NOTE   Pharmacy Consult for heparin drip Indication: atrial fibrillation  Allergies  Allergen Reactions  . Ace Inhibitors     Other reaction(s): Other (See Comments) Other Reaction: Other reaction  . Alendronate     Other reaction(s): Other (See Comments) Other Reaction: Other reaction  . Eggs Or Egg-Derived Products Other (See Comments)    Unknown, pt just said that she can't eat eggs due to not being able to take flu vaccine  . Influenza Vaccines Other (See Comments)    "kept the flu for a year"    Patient Measurements: Height: 5\' 3"  (160 cm) Weight: 117 lb 15.1 oz (53.5 kg) IBW/kg (Calculated) : 52.4 Heparin Dosing Weight: 48.4kg  Vital Signs: Temp: 98.7 F (37.1 C) (04/06 0200) Temp Source: Axillary (04/06 0200) BP: 114/56 (04/06 0400) Pulse Rate: 58 (04/06 0400)  Labs: Recent Labs    07/10/17 0627  07/11/17 0537 07/11/17 1447 07/11/17 2249 Aug 08, 2017 0542  HGB 9.1*  --  8.7*  --   --  8.0*  HCT 28.3*  --  26.9*  --   --  25.4*  PLT 229  --  230  --   --  222  HEPARINUNFRC  --    < > 0.27* 0.35 0.30 0.25*  CREATININE 0.69  --  0.71  --   --  0.82   < > = values in this interval not displayed.    Estimated Creatinine Clearance: 40 mL/min (by C-G formula based on SCr of 0.82 mg/dL).   Medical History: Past Medical History:  Diagnosis Date  . Arthritis    hands, knees  . Asthma   . COPD (chronic obstructive pulmonary disease) (HCC)   . Dysrhythmia    A-Fib  . Full dentures    upper and lower  . GERD (gastroesophageal reflux disease)   . Hypercholesteremia   . Hypertension   . Osteoporosis   . Paroxysmal atrial fibrillation (HCC)   . Shortness of breath dyspnea     Medications:  Scheduled:  . budesonide (PULMICORT) nebulizer solution  0.25 mg Nebulization BID  . heparin  700 Units Intravenous Once  . insulin aspart  0-9 Units Subcutaneous TID WC  . levalbuterol  0.63 mg Nebulization Q6H  . magnesium oxide  400 mg Oral  BID  . mouth rinse  15 mL Mouth Rinse q12n4p  . metoprolol tartrate  25 mg Per NG tube BID  . pantoprazole sodium  40 mg Per Tube q1800  . PARoxetine  20 mg Oral Daily  . Valproate Sodium  125 mg Per Tube Q8H   Infusions:  . amiodarone 30 mg/hr (Aug 08, 2017 0600)  . dextrose 25 mL/hr at 2017-08-08 0600  . feeding supplement (GLUCERNA 1.2 CAL) 50 mL/hr at 08/08/17 0600  . heparin 1,150 Units/hr (2017-08-08 0600)  . piperacillin-tazobactam (ZOSYN)  IV Stopped (08-08-2017 0330)    Assessment: Pharmacy consulted for heparin drip management for 84 yo female on heparin drip for new onset atrial fibrillation. Patient with CHADS2VASc of 5. Patient currently receiving heparin 1150 units/hr.    -Goal of Therapy:  Heparin level 0.3-0.7 units/ml Monitor platelets by anticoagulation protocol: Yes   Plan:  Anti-Xa level is currently in range. Will obtain confirmatory level at  2230. Will continue heparin at 1150 units/hr.   Pharmacy will continue to monitor and adjust per consult.   Swade Shonka S 08-08-2017 6:33 AM `  4/5 PM heparin level 0.30. Continue current regimen. Recheck heparin level and CBC with tomorrow AM labs.  4/6 AM heparin level 0.25. 700 unit bolus and increase rate to 1250 units/hr. Recheck in 8 hours.  Fulton Reek, PharmD, BCPS  07/24/2017 6:33 AM

## 2017-08-06 NOTE — Progress Notes (Signed)
Chaplain responded to a page @ 12:08 to meet with patient and family. Patient drifted in and out of awareness and was barely audible. Chaplain actively listened, engaging both nieces Charleston Ropes and [place]). Chaplain offered prayer, presence, and a blessing for the patient.

## 2017-08-06 NOTE — Progress Notes (Signed)
Pharmacy Antibiotic Note  Crystal Todd is a 84 y.o. female admitted on 07/08/17 with pneumonia.  Pharmacy has been consulted for Zosyn/Vancomycin dosing.  Vancomycin discontinued during AM ICU rounds 07/11/17.   Plan: Day 7- Will continue Zosyn 3.375g IV Q8hr.    Height: 5\' 3"  (160 cm) Weight: 117 lb 15.1 oz (53.5 kg) IBW/kg (Calculated) : 52.4  Temp (24hrs), Avg:98.5 F (36.9 C), Min:98.1 F (36.7 C), Max:98.8 F (37.1 C)  Recent Labs  Lab 07/08/17 0530 07/09/17 0047 07/10/17 0627 07/11/17 0537 08/02/2017 0542  WBC 14.6* 21.1* 25.2* 25.2* 22.0*  CREATININE 0.84 0.81 0.69 0.71 0.82    Estimated Creatinine Clearance: 40 mL/min (by C-G formula based on SCr of 0.82 mg/dL).    Allergies  Allergen Reactions  . Ace Inhibitors     Other reaction(s): Other (See Comments) Other Reaction: Other reaction  . Alendronate     Other reaction(s): Other (See Comments) Other Reaction: Other reaction  . Eggs Or Egg-Derived Products Other (See Comments)    Unknown, pt just said that she can't eat eggs due to not being able to take flu vaccine  . Influenza Vaccines Other (See Comments)    "kept the flu for a year"    Antimicrobials this admission: Zosyn 3/31 >> Vancomycin 4/4  >> 4/5  Dose adjustments this admission:   Microbiology results: 3/28 BCx: NG x5d 3/28 MRSA PCR: (-)  Pharmacy will continue to monitor and adjust per consult.   Crystal Todd A 07/21/2017 11:01 AM

## 2017-08-06 NOTE — Progress Notes (Signed)
Pharmacy Electrolyte Monitoring Consult:  Pharmacy consulted to assist in monitoring and replacing electrolytes in this 84 y.o. female admitted on 06/18/2017 with Shortness of Breath  Patient currently receiving furosemide 20mg  IV Q8hr. Patient will need to received oral supplementation.    Labs:  Sodium (mmol/L)  Date Value  24-Jul-2017 140  06/18/2011 142   Potassium (mmol/L)  Date Value  07/24/17 4.7  06/18/2011 4.2   Magnesium (mg/dL)  Date Value  08/18/2011 2.1   Phosphorus (mg/dL)  Date Value  85/27/7824 4.1   Calcium (mg/dL)  Date Value  23/53/6144 8.3 (L)   Calcium, Total (mg/dL)  Date Value  31/54/0086 9.2   Albumin (g/dL)  Date Value  76/19/5093 2.6 (L)    Plan:  Will replace potassium 26/71/2458 PO Q4hr x 3 doses. Will order magnesium 400mg  PO BID. Will recheck electrolytes with am labs.   Goal potassium ~ 4 and goal magnesium ~2 per AM rounds.   6/4:  K 4.7, Mag 2.1 Patient ordered furosemide 80 mg IV bid.  Mag Ox 400 mg po bid x 4 doses started 07/11/17. Pharmacy will continue to monitor and adjust per consult.   Crystal Todd A July 24, 2017 10:56 AM

## 2017-08-06 NOTE — Progress Notes (Signed)
Name: Crystal Todd MRN: [id number] DOB: 09-Mar-1928     CONSULTATION DATE: 07/02/2017  Continues to be on BiPAP, on Heparin and Amiodarone. Patient with worsening respiratory status  PAST MEDICAL HISTORY :   has a past medical history of Arthritis, Asthma, COPD (chronic obstructive pulmonary disease) (HCC), Dysrhythmia, Full dentures, GERD (gastroesophageal reflux disease), Hypercholesteremia, Hypertension, Osteoporosis, Paroxysmal atrial fibrillation (HCC), and Shortness of breath dyspnea.  has a past surgical history that includes Back surgery; Esophagogastroduodenoscopy (egd) with esophageal dilation; Joint replacement; Wrist fracture surgery (Bilateral); and Cataract extraction w/PHACO (Left, 10/12/2014).    Prior to Admission medications   Medication Sig Start Date End Date Taking? Authorizing Provider  acetaminophen (TYLENOL) 325 MG tablet Take 650 mg by mouth every 4 (four) hours as needed for fever (up to 101).   Yes [provider]  albuterol (PROVENTIL HFA;VENTOLIN HFA) 108 (90 Base) MCG/ACT inhaler Inhale 2 puffs into the lungs every 6 (six) hours as needed for wheezing or shortness of breath. 07/02/17  Yes Willy Eddy, MD  alum & mag hydroxide-simeth (ANTACID) 200-200-20 MG/5ML suspension Take 30 mLs by mouth every 6 (six) hours as needed for indigestion or heartburn.   Yes [provider]  amLODipine (NORVASC) 5 MG tablet Take 5 mg by mouth daily.   Yes [provider]  cholecalciferol (VITAMIN D) 1000 units tablet Take 1,000 Units by mouth daily.   Yes [provider]  diphenhydrAMINE (BENADRYL) 25 MG tablet Take 25 mg by mouth every 6 (six) hours as needed for allergies.   Yes [provider]  divalproex (DEPAKOTE SPRINKLE) 125 MG capsule Take 125-250 mg by mouth 2 (two) times daily. 125 mg every morning and 250 mg at bedtime   Yes [provider]  fluticasone (FLONASE) 50 MCG/ACT nasal spray Place 2 sprays into the  nose 2 (two) times daily.   Yes [provider]  guaiFENesin (ROBITUSSIN) 100 MG/5ML liquid Take 300 mg by mouth every 6 (six) hours as needed for cough.   Yes [provider]  ipratropium-albuterol (DUONEB) 0.5-2.5 (3) MG/3ML SOLN Take 3 mLs by nebulization every 6 (six) hours as needed (SOB or wheezing). Patient taking differently: Take 3 mLs by nebulization every 6 (six) hours as needed. For wheezing or shortness of breath 06/30/17  Yes Sudini, Wardell Heath, MD  loperamide (IMODIUM A-D) 2 MG capsule Take 4 mg by mouth as needed for diarrhea or loose stools.   Yes [provider]  magnesium hydroxide (MILK OF MAGNESIA) 400 MG/5ML suspension Take 30 mLs by mouth daily as needed for mild constipation.   Yes [provider]  metoprolol tartrate (LOPRESSOR) 25 MG tablet Take 12.5 mg by mouth 2 (two) times daily.   Yes [provider]  multivitamin (PROSIGHT) TABS tablet Take 1 tablet by mouth daily.   Yes [provider]  omeprazole (PRILOSEC) 20 MG capsule Take 20 mg by mouth daily.   Yes [provider]  oseltamivir (TAMIFLU) 30 MG capsule Take 1 capsule (30 mg total) by mouth 2 (two) times daily. 06/30/17  Yes Sudini, Wardell Heath, MD  PARoxetine (PAXIL) 20 MG tablet Take 20 mg by mouth daily.   Yes [provider]  predniSONE (DELTASONE) 10 MG tablet Take 10 mg by mouth 4 (four) times daily. For 2 days, then 3 times daily for 2 days, then 2 times daily for 2 days, then daily for 2 days, then stop 07/01/17  Yes [provider]  vitamin B-12 (CYANOCOBALAMIN) 1000 MCG tablet Take  1,000 mcg by mouth daily.   Yes [provider]   Allergies  Allergen Reactions  . Ace Inhibitors     Other reaction(s): Other (See Comments) Other Reaction: Other reaction  . Alendronate     Other reaction(s): Other (See Comments) Other Reaction: Other reaction  . Eggs Or Egg-Derived Products Other (See Comments)    Unknown, pt just said that  she can't eat eggs due to not being able to take flu vaccine  . Influenza Vaccines Other (See Comments)    "kept the flu for a year"    FAMILY HISTORY:  family history is not on file. SOCIAL HISTORY:  reports that she quit smoking about 43 years ago. She has never used smokeless tobacco. She reports that she does not drink alcohol.  REVIEW OF SYSTEMS:   Unable to obtain due to critical illness   VITAL SIGNS: Temp:  [98.6 F (37 C)-98.8 F (37.1 C)] 98.7 F (37.1 C) (04/06 0200) Pulse Rate:  [56-78] 61 (04/06 0600) Resp:  [19-38] 23 (04/06 0600) BP: (104-194)/(45-99) 160/55 (04/06 0600) SpO2:  [84 %-99 %] 97 % (04/06 0600) FiO2 (%):  [92 %-100 %] 100 % (04/06 0200) Weight:  [53.5 kg (117 lb 15.1 oz)] 53.5 kg (117 lb 15.1 oz) (04/06 0457)  Physical Examination:  Patient presently on BiPAP with decreased level of responsiveness. HEENT: On BiPAP, increaseaccessory muscle utilization noted Cardiovascular: Tachycardia Pulmonary: Coarse rhd rales right greater than left Abdomen: Positive bowel sounds, soft exam Extremities: No clubbing, cyanosis or edema appreciated Neurologic: Encephalopathic  ASSESSMENT / PLAN: Acute hypoxic respiratory failure on chronic respiratory failure baseline home O2 2 L/min ith increasing FiO2 requirements. Presently on Zosyn for aspiration pneumonia. Chest x-ray was worse yesterday. Patient has been on and off of noninvasive ventilation and he did high flow. Poor prognosis. Will discuss with family goals of care this morning consideration for comfort Patient is presently a DO NOT RESUSCITATE.   Leukocytosis. White count is 22  Altered mental status with hypoxia / meds./ toxic metabolic encephalopathy.  Flu.Completed Tamiflu for a total of 10 days of therapy.  A. fib with RVROn amiodarone drip  COPD exacerbation -Bronchodilators and taper steroids.  Mild elevation of troponin due to demand versus supply mismatch   Tora Kindred, DO

## 2017-08-06 NOTE — Progress Notes (Signed)
[facility] [place] NAME: Crystal Todd    MR#:  [id number]  DATE OF BIRTH:  08/21/1927  SUBJECTIVE:   Patient remains on BiPAP.  Patient with worsening respiratory status  REVIEW OF SYSTEMS:    Unable to obtain patient on Bipap  Lethargic  Tolerating Diet:tube feeds   DRUG ALLERGIES:   Allergies  Allergen Reactions  . Ace Inhibitors     Other reaction(s): Other (See Comments) Other Reaction: Other reaction  . Alendronate     Other reaction(s): Other (See Comments) Other Reaction: Other reaction  . Eggs Or Egg-Derived Products Other (See Comments)    Unknown, pt just said that she can't eat eggs due to not being able to take flu vaccine  . Influenza Vaccines Other (See Comments)    "kept the flu for a year"    VITALS:  Blood pressure (!) 160/55, pulse 61, temperature 98.7 F (37.1 C), temperature source Axillary, resp. rate (!) 23, height '5\' 3"'  (1.6 m), weight 53.5 kg (117 lb 15.1 oz), SpO2 97 %.  PHYSICAL EXAMINATION:  Constitutional: Appears thin and frail increased respiratory effort hENT: Normocephalic. . On BIpap Eyes: Conjunctivae and EOM are normal. , no scleral icterus.  Neck:  Neck supple. No JVD. No tracheal deviation. CVS: Tachycardic no murmurs, no gallops, no carotid bruit.  Pulmonary: b/l coarse rhonchi no wheezing  Abdominal: Soft. BS +,  no distension, tenderness, rebound or guarding.  Musculoskeletal: . No edema and no tenderness.  Neuro: on bipap Skin: Skin is warm and dry. No rash noted. Psychiatric: Lethargic this morning  LABORATORY PANEL:   CBC Recent Labs  Lab 08-05-2017 0542  WBC 22.0*  HGB 8.0*  HCT 25.4*  PLT 222   ------------------------------------------------------------------------------------------------------------------  Chemistries  Recent Labs  Lab 07/06/17 0150  2017-08-05 0542  NA 137   < > 140  K 4.6   < > 4.7  CL 106   < > 91*  CO2 21*   < > 39*  GLUCOSE 207*    < > 141*  BUN 23*   < > 23*  CREATININE 0.75   < > 0.82  CALCIUM 7.8*   < > 8.3*  MG 2.4   < > 2.1  AST 39  --   --   ALT 52  --   --   ALKPHOS 66  --   --   BILITOT 0.4  --   --    < > = values in this interval not displayed.   ------------------------------------------------------------------------------------------------------------------  Cardiac Enzymes Recent Labs  Lab 07/05/17 1404 07/05/17 1917  TROPONINI <0.03 <0.03   ------------------------------------------------------------------------------------------------------------------  RADIOLOGY:  Dg Chest Port 1 View  Result Date: 07/11/2017 CLINICAL DATA:  CHF EXAM: PORTABLE CHEST 1 VIEW COMPARISON:  07/10/2017, 07/09/2017, 07/08/2017 FINDINGS: Increased bilateral pleural effusions. Stable enlarged cardiomediastinal silhouette with vascular congestion and asymmetric right greater than left airspace disease. Persistent consolidation at the left base. Aortic atherosclerosis. IMPRESSION: 1. Increasing pleural effusions. 2. Cardiomegaly with vascular congestion and continued extensive right greater than left airspace disease Electronically Signed   By: Donavan Foil M.D.   On: 07/11/2017 03:41     ASSESSMENT AND PLAN:   84 year old female with history of COPD, essential hypertension and PAF with recent discharge from the hospital for influenza A and COPD who presented with shortness of breath.  1.  Septic shock with acute hypoxic respiratory failure: Patient presented with hypotension, tachycardia, leukocytosis and fever  Sepsis is  due to pneumonia and influenza A Is likely patient now has ARDS with aspiration pneumonia continue Zosyn and vancomycin she has completed treatment with Tamiflu for influenza. NPO for high risk of aspiration as per Speech consult She is on tube feeds. Chest x-ray  shows bilateral progression of underlying pneumonia  2.  Pneumonia: Initially patient was treated for healthcare acquired pneumonia  however it appears now that patient is aspirating.  Antibiotics changed from cefepime to Zosyn (day 7) day 3 of vancomycin  MRSA PCR negative   3.  Influenza A: She has completed treatment with Tamiflu. 4.  Septic shock: Blood pressure tolerable  5.  PAF with RVR Continue Amiodarone, metoprolol  and heparin gtt as per Cardiology Echo shows normal ejection fraction with moderate mitral regurgitation and elevated PA pressures Appreciate cardiology consultation TSH was wnml   6.  Acute COPD exacerbation due to pneumonia and influenza:  This has resolved  7.  Elevated troponin due to demand ischemia: Patient has been ruled out for ACS.  8. Elevated blood sugar due to steroids A1c is 5.8   9.  Acute on chronic diastolic heart failure with preserved ejection fraction:  Continue Lasix as per recommendations by cardiology. 10. Aspiration risk: Patient is NPO and high risk of aspiration. She is DNR. Palliative care consult appreciated.  11. Hypokalemia: Replete PRN 12.  Elevated WBC: Continue to monitor  She with very poor overall prognosis.  She is DNR.  She has increased respiratory effort.  PC met with family and if no improvement or SOB escalates then transition to comfort care    CODE STATUS: DNR  TOTAL TIME TAKING CARE OF THIS PATIENT: 24 minutes.    D/w dr Jefferson Fuel  POSSIBLE D/C?? DEPENDING ON CLINICAL CONDITION.   Ada Woodbury M.D on 07-31-2017 at 8:29 AM  Between 7am to 6pm - Pager - [phone] After 6pm go to [email or web address] - password [email or web address] [facility]  Office  [phone]  CC: Primary care physician; Patient, No Pcp Per  Note: This dictation was prepared with Dragon dictation along with smaller phrase technology. Any transcriptional errors that result from this process are unintentional.

## 2017-08-06 NOTE — Death Summary Note (Signed)
Death summary Date of admission: 07/17/2017 Date of death: July 26, 2017 Time of death: 1601/08/05 Cause of death: Progressive respiratory failure secondary to aspiration pneumonia  Hospital course: patient was an 84 year old female with a past medical history remarkable for hypertension, atrial fibrillation, COPD, hyperlipidemia, arthritis, recently diagnosis with influenza A, was admitted from skilled nursing facility secondary to hypoxemic respiratory failure hypotension and leukocytosis. Chest x-ray was remarkable for pneumonia.Was started on noninvasive ventilation, antibiotic therapy and saline resuscitation. Consultation with pulmonary/critical care and cardiology was obtained. Patient's atrial fibrillation was treated with intravenous amiodarone infusion and IV heparin.throughout her hospital course patient was treated with antibiotics to include initially cefepime and vancomycin. She had been treated with Tamiflu. Unfortunately her hypoxemic respiratory failure progressed, her chest x-ray abnormalities progressed with dense right-sided pneumonia worrisome for aspiration.patient was on and off of noninvasive ventilation and he did high flow oxygen. Had become more more encephalopathic. Long discussions with family had occurred throughout the entire hospitalization. Initially they wanted to give her a chance to see if she would recover from the pneumonia.Patient was a DO NOT RESUSCITATE and did not want aggressive therapeutics include life support. Patient had made no progress over the last 48 hours. Discussed again with family goals of care.They were appreciative and wish to proceed with comfort care measures.BiPAP and he did high flow were stopped, patient was started on a titratable morphine infusion for pain and comfort along with as needed Ativan. Patient subsequently deceased time of death 39. Had been visited by chaplain and all comfort care measures instituted.  Tora Kindred, D.O.

## 2017-08-06 NOTE — Plan of Care (Signed)
Patient continues to struggle with tolerating BiPap and HFNC.  Taking shallow breaths with increased respirations into 30s.  Redirection needed multiple times due to patient attempting to take BiPap mask off. Morphine given x 3 for comfort.  Good urine output.  Will continue to monitor.

## 2017-08-06 NOTE — Progress Notes (Signed)
Patient time of death occurred at 82. Verified by Jiles Prows, RN and Stevphen Meuse, RN. Niece and great-niece at bedside with pt at time of death. Family is requesting [employer]. Dr. Lonn [place] notified.  Donor Services have released pt and state she is not eligible for donation. Nursing Supervisor notified.

## 2017-08-06 NOTE — Progress Notes (Signed)
Pt transitioned to comfort care measures.

## 2017-08-06 NOTE — Progress Notes (Signed)
Pulmonary/critical care attending.  Had long discussion with family members. We reviewed the fact that she has been on BiPAP and heated high flow now without any significant improvement. Patient continues to struggle with increased work of breathing secondary to severe aspiration pneumonia.We reviewed that she would not want to be placed on life support and mechanical ventilation.In lieu of this the family wishes [place] to proceed with comfort care. We'll place on a morphine infusion along with as needed Ativan.  Tora Kindred, D.O.

## 2017-08-06 DEATH — deceased

## 2019-09-27 IMAGING — DX DG CHEST 1V PORT
1 series · 1 of 1 positions shown · non-contrast
Comparison: 07/09/2017

CLINICAL DATA: Pneumonia

EXAM:
PORTABLE CHEST 1 VIEW

[chest ap]
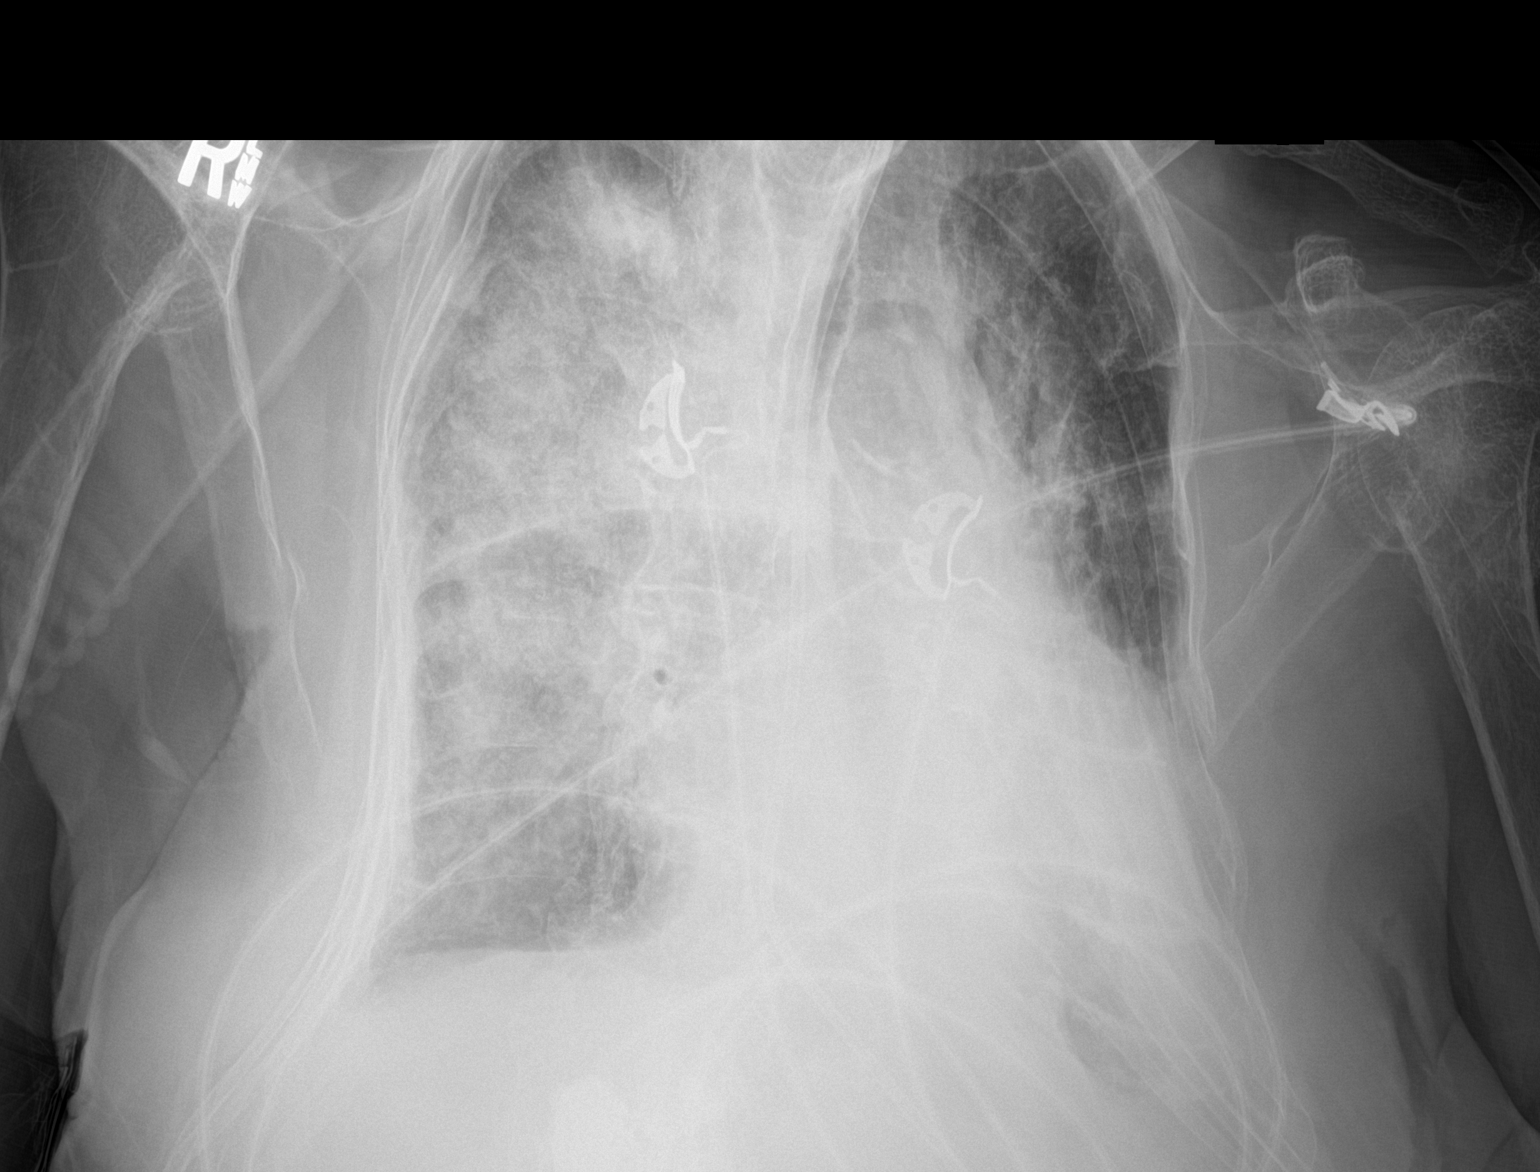

[1 of 1 positions shown; findings below may reference images not displayed]

FINDINGS: Progression of severe bilateral airspace disease right greater than
left. Left lower lobe consolidation compatible with atelectasis
unchanged. No significant effusion.

Feeding tube enters the stomach with the tip not visualized.
IMPRESSION: Severe bilateral airspace disease, with progression

## 2019-09-28 IMAGING — DX DG CHEST 1V PORT
1 series · 1 of 1 positions shown · non-contrast
Comparison: 07/10/2017, 07/09/2017, 07/08/2017

CLINICAL DATA: CHF

EXAM:
PORTABLE CHEST 1 VIEW

[chest ap]
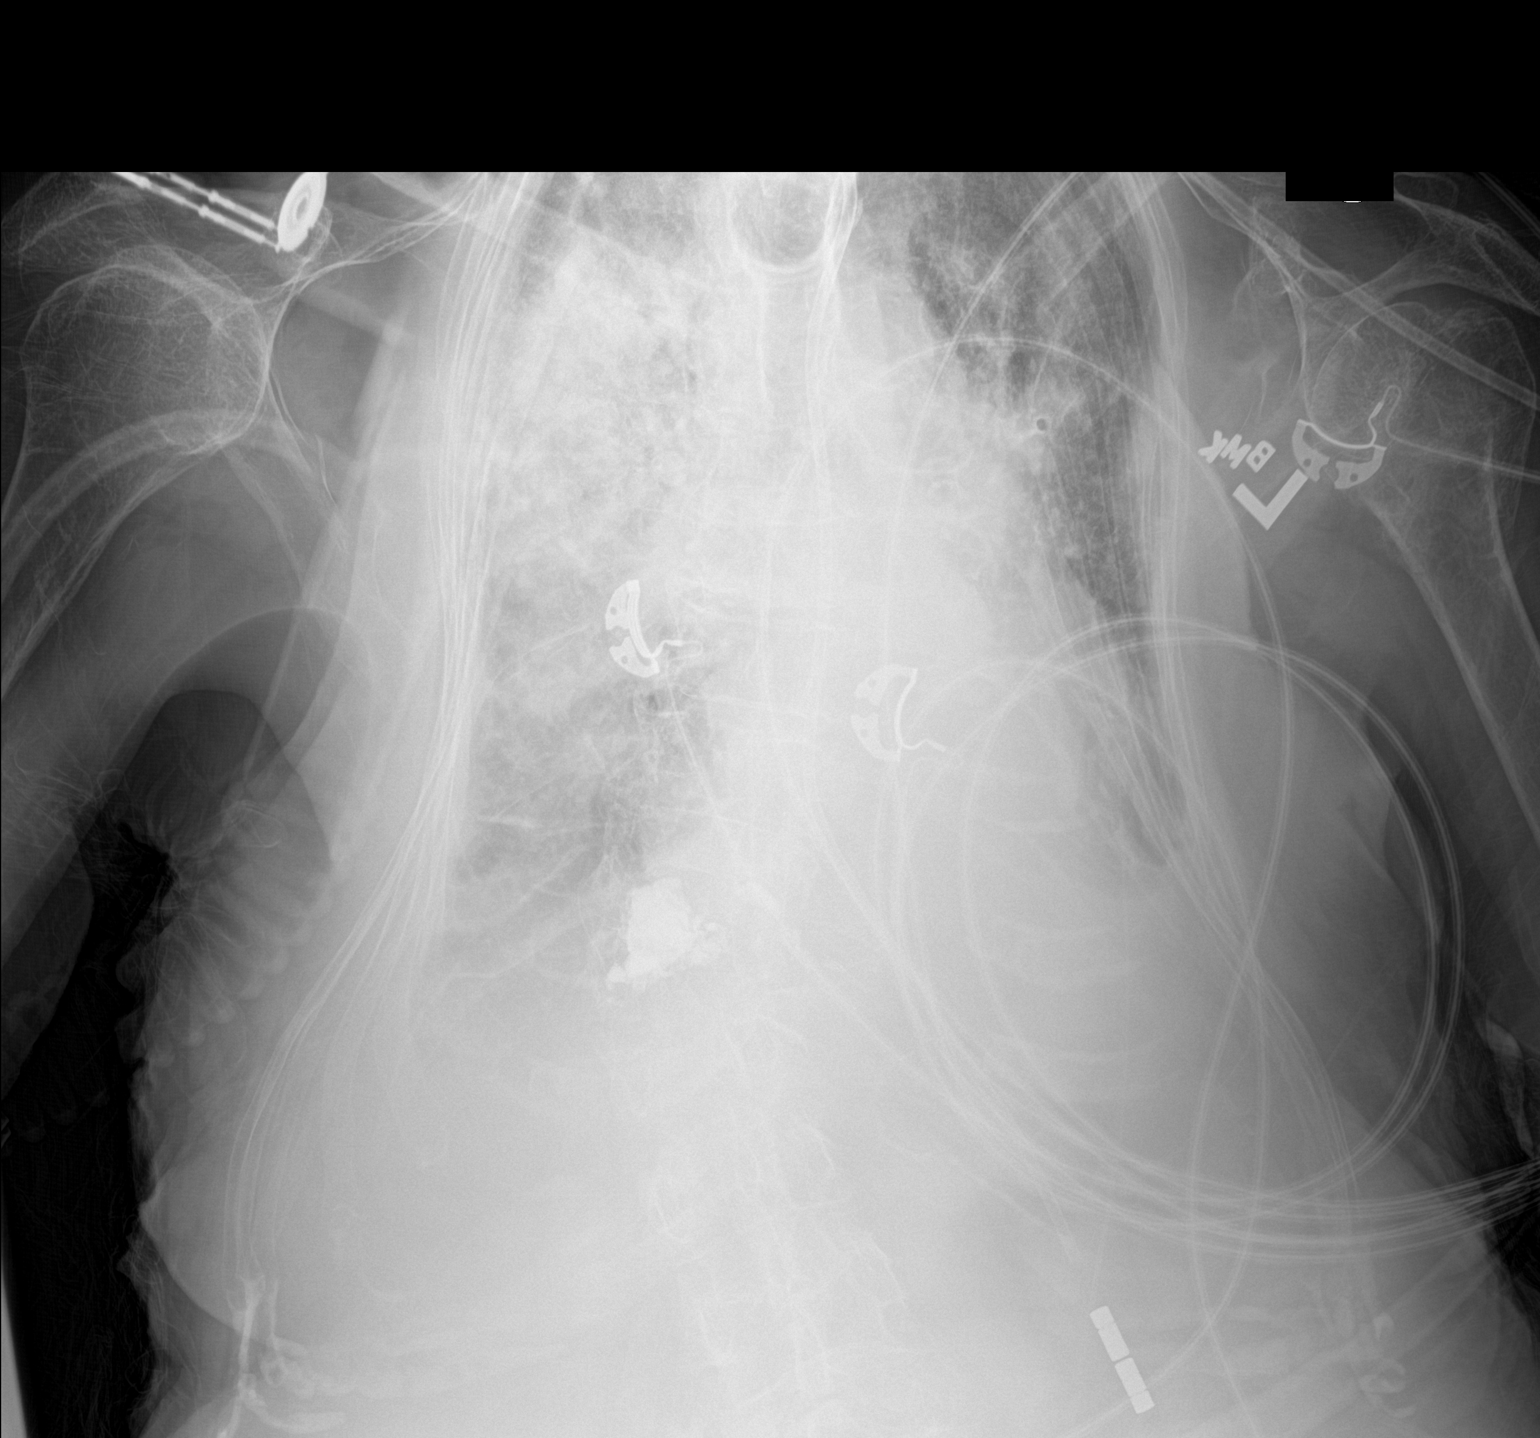

[1 of 1 positions shown; findings below may reference images not displayed]

FINDINGS: Increased bilateral pleural effusions. Stable enlarged
cardiomediastinal silhouette with vascular congestion and asymmetric
right greater than left airspace disease. Persistent consolidation
at the left base. Aortic atherosclerosis.
IMPRESSION: 1. Increasing pleural effusions.
2. Cardiomegaly with vascular congestion and continued extensive
right greater than left airspace disease
